# Patient Record
Sex: Male | Born: 1945
Health system: Southern US, Community
[De-identification: ages and names within clinical notes are randomized; demographics above are authoritative.]

## PROBLEM LIST (undated history)

## (undated) DIAGNOSIS — T8859XA Other complications of anesthesia, initial encounter: Secondary | ICD-10-CM

## (undated) DIAGNOSIS — C439 Malignant melanoma of skin, unspecified: Secondary | ICD-10-CM

## (undated) DIAGNOSIS — R112 Nausea with vomiting, unspecified: Secondary | ICD-10-CM

## (undated) DIAGNOSIS — C73 Malignant neoplasm of thyroid gland: Secondary | ICD-10-CM

## (undated) DIAGNOSIS — I10 Essential (primary) hypertension: Secondary | ICD-10-CM

## (undated) DIAGNOSIS — K635 Polyp of colon: Secondary | ICD-10-CM

## (undated) DIAGNOSIS — R32 Unspecified urinary incontinence: Secondary | ICD-10-CM

## (undated) DIAGNOSIS — M1990 Unspecified osteoarthritis, unspecified site: Secondary | ICD-10-CM

## (undated) DIAGNOSIS — T7840XA Allergy, unspecified, initial encounter: Secondary | ICD-10-CM

## (undated) DIAGNOSIS — Z8619 Personal history of other infectious and parasitic diseases: Secondary | ICD-10-CM

## (undated) DIAGNOSIS — J383 Other diseases of vocal cords: Secondary | ICD-10-CM

## (undated) DIAGNOSIS — Z9889 Other specified postprocedural states: Secondary | ICD-10-CM

## (undated) DIAGNOSIS — E785 Hyperlipidemia, unspecified: Secondary | ICD-10-CM

## (undated) DIAGNOSIS — E039 Hypothyroidism, unspecified: Secondary | ICD-10-CM

## (undated) DIAGNOSIS — N189 Chronic kidney disease, unspecified: Secondary | ICD-10-CM

## (undated) DIAGNOSIS — R011 Cardiac murmur, unspecified: Secondary | ICD-10-CM

## (undated) HISTORY — PX: CARDIAC VALVE REPLACEMENT: SHX585

## (undated) HISTORY — DX: Unspecified urinary incontinence: R32

## (undated) HISTORY — PX: MELANOMA EXCISION: SHX5266

## (undated) HISTORY — DX: Essential (primary) hypertension: I10

## (undated) HISTORY — DX: Polyp of colon: K63.5

## (undated) HISTORY — DX: Malignant neoplasm of thyroid gland: C73

## (undated) HISTORY — DX: Cardiac murmur, unspecified: R01.1

## (undated) HISTORY — DX: Other diseases of vocal cords: J38.3

## (undated) HISTORY — DX: Hyperlipidemia, unspecified: E78.5

## (undated) HISTORY — DX: Chronic kidney disease, unspecified: N18.9

## (undated) HISTORY — DX: Personal history of other infectious and parasitic diseases: Z86.19

## (undated) HISTORY — PX: JOINT REPLACEMENT: SHX530

## (undated) HISTORY — DX: Allergy, unspecified, initial encounter: T78.40XA

## (undated) HISTORY — DX: Malignant melanoma of skin, unspecified: C43.9

## (undated) HISTORY — PX: COLONOSCOPY: SHX174

---
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
## 1988-05-09 HISTORY — PX: MELANOMA EXCISION: SHX5266

## 1992-05-09 HISTORY — PX: MELANOMA EXCISION: SHX5266

## 2000-05-09 HISTORY — PX: TOTAL THYROIDECTOMY: SHX2547

## 2013-07-10 LAB — HM COLONOSCOPY

## 2014-05-09 HISTORY — PX: TOTAL KNEE ARTHROPLASTY: SHX125

## 2014-05-09 HISTORY — PX: BLADDER SURGERY: SHX569

## 2015-05-14 DIAGNOSIS — N401 Enlarged prostate with lower urinary tract symptoms: Secondary | ICD-10-CM | POA: Diagnosis not present

## 2015-05-29 DIAGNOSIS — R338 Other retention of urine: Secondary | ICD-10-CM | POA: Diagnosis not present

## 2015-06-03 DIAGNOSIS — N400 Enlarged prostate without lower urinary tract symptoms: Secondary | ICD-10-CM | POA: Diagnosis not present

## 2015-06-03 DIAGNOSIS — N401 Enlarged prostate with lower urinary tract symptoms: Secondary | ICD-10-CM | POA: Diagnosis not present

## 2015-06-03 DIAGNOSIS — R339 Retention of urine, unspecified: Secondary | ICD-10-CM | POA: Diagnosis not present

## 2015-06-03 DIAGNOSIS — Z9089 Acquired absence of other organs: Secondary | ICD-10-CM | POA: Diagnosis not present

## 2015-06-03 DIAGNOSIS — N320 Bladder-neck obstruction: Secondary | ICD-10-CM | POA: Diagnosis not present

## 2015-06-03 DIAGNOSIS — Z882 Allergy status to sulfonamides status: Secondary | ICD-10-CM | POA: Diagnosis not present

## 2015-06-03 DIAGNOSIS — Z85850 Personal history of malignant neoplasm of thyroid: Secondary | ICD-10-CM | POA: Diagnosis not present

## 2015-06-03 DIAGNOSIS — Z885 Allergy status to narcotic agent status: Secondary | ICD-10-CM | POA: Diagnosis not present

## 2015-06-08 DIAGNOSIS — R338 Other retention of urine: Secondary | ICD-10-CM | POA: Diagnosis not present

## 2015-06-18 DIAGNOSIS — H18232 Secondary corneal edema, left eye: Secondary | ICD-10-CM | POA: Diagnosis not present

## 2015-06-18 DIAGNOSIS — H2513 Age-related nuclear cataract, bilateral: Secondary | ICD-10-CM | POA: Diagnosis not present

## 2015-06-23 DIAGNOSIS — R112 Nausea with vomiting, unspecified: Secondary | ICD-10-CM | POA: Diagnosis not present

## 2015-07-14 DIAGNOSIS — E785 Hyperlipidemia, unspecified: Secondary | ICD-10-CM | POA: Diagnosis not present

## 2015-07-14 DIAGNOSIS — C73 Malignant neoplasm of thyroid gland: Secondary | ICD-10-CM | POA: Diagnosis not present

## 2015-07-14 DIAGNOSIS — E291 Testicular hypofunction: Secondary | ICD-10-CM | POA: Diagnosis not present

## 2015-07-14 DIAGNOSIS — E039 Hypothyroidism, unspecified: Secondary | ICD-10-CM | POA: Diagnosis not present

## 2015-07-14 DIAGNOSIS — I10 Essential (primary) hypertension: Secondary | ICD-10-CM | POA: Diagnosis not present

## 2015-07-14 DIAGNOSIS — Z125 Encounter for screening for malignant neoplasm of prostate: Secondary | ICD-10-CM | POA: Diagnosis not present

## 2015-07-16 DIAGNOSIS — E890 Postprocedural hypothyroidism: Secondary | ICD-10-CM | POA: Diagnosis not present

## 2015-08-12 DIAGNOSIS — E890 Postprocedural hypothyroidism: Secondary | ICD-10-CM | POA: Insufficient documentation

## 2015-08-12 DIAGNOSIS — E291 Testicular hypofunction: Secondary | ICD-10-CM | POA: Insufficient documentation

## 2015-08-23 DIAGNOSIS — Z85850 Personal history of malignant neoplasm of thyroid: Secondary | ICD-10-CM | POA: Diagnosis not present

## 2015-08-23 DIAGNOSIS — I10 Essential (primary) hypertension: Secondary | ICD-10-CM | POA: Diagnosis not present

## 2015-08-23 DIAGNOSIS — E039 Hypothyroidism, unspecified: Secondary | ICD-10-CM | POA: Diagnosis not present

## 2015-08-23 DIAGNOSIS — Z886 Allergy status to analgesic agent status: Secondary | ICD-10-CM | POA: Diagnosis not present

## 2015-08-23 DIAGNOSIS — G510 Bell's palsy: Secondary | ICD-10-CM | POA: Diagnosis not present

## 2015-08-23 DIAGNOSIS — E785 Hyperlipidemia, unspecified: Secondary | ICD-10-CM | POA: Diagnosis not present

## 2015-08-23 DIAGNOSIS — Z79899 Other long term (current) drug therapy: Secondary | ICD-10-CM | POA: Diagnosis not present

## 2015-08-23 DIAGNOSIS — Z882 Allergy status to sulfonamides status: Secondary | ICD-10-CM | POA: Diagnosis not present

## 2015-08-23 DIAGNOSIS — Z7982 Long term (current) use of aspirin: Secondary | ICD-10-CM | POA: Diagnosis not present

## 2015-08-24 DIAGNOSIS — G510 Bell's palsy: Secondary | ICD-10-CM | POA: Diagnosis not present

## 2015-09-04 DIAGNOSIS — R338 Other retention of urine: Secondary | ICD-10-CM | POA: Diagnosis not present

## 2015-09-04 DIAGNOSIS — G510 Bell's palsy: Secondary | ICD-10-CM | POA: Diagnosis not present

## 2015-09-04 DIAGNOSIS — H2513 Age-related nuclear cataract, bilateral: Secondary | ICD-10-CM | POA: Diagnosis not present

## 2015-09-04 DIAGNOSIS — H18232 Secondary corneal edema, left eye: Secondary | ICD-10-CM | POA: Diagnosis not present

## 2015-10-08 DIAGNOSIS — H18232 Secondary corneal edema, left eye: Secondary | ICD-10-CM | POA: Diagnosis not present

## 2015-10-08 DIAGNOSIS — H2513 Age-related nuclear cataract, bilateral: Secondary | ICD-10-CM | POA: Diagnosis not present

## 2015-10-08 DIAGNOSIS — G510 Bell's palsy: Secondary | ICD-10-CM | POA: Diagnosis not present

## 2015-10-14 DIAGNOSIS — E039 Hypothyroidism, unspecified: Secondary | ICD-10-CM | POA: Diagnosis not present

## 2015-10-14 DIAGNOSIS — E291 Testicular hypofunction: Secondary | ICD-10-CM | POA: Diagnosis not present

## 2015-12-08 DIAGNOSIS — Z0000 Encounter for general adult medical examination without abnormal findings: Secondary | ICD-10-CM | POA: Diagnosis not present

## 2015-12-08 DIAGNOSIS — E291 Testicular hypofunction: Secondary | ICD-10-CM | POA: Diagnosis not present

## 2015-12-08 DIAGNOSIS — E7800 Pure hypercholesterolemia, unspecified: Secondary | ICD-10-CM | POA: Diagnosis not present

## 2015-12-15 DIAGNOSIS — Z0000 Encounter for general adult medical examination without abnormal findings: Secondary | ICD-10-CM | POA: Diagnosis not present

## 2015-12-15 DIAGNOSIS — Z9889 Other specified postprocedural states: Secondary | ICD-10-CM | POA: Insufficient documentation

## 2015-12-15 DIAGNOSIS — Z23 Encounter for immunization: Secondary | ICD-10-CM | POA: Diagnosis not present

## 2015-12-15 DIAGNOSIS — Z8774 Personal history of (corrected) congenital malformations of heart and circulatory system: Secondary | ICD-10-CM | POA: Insufficient documentation

## 2016-01-04 DIAGNOSIS — Z23 Encounter for immunization: Secondary | ICD-10-CM | POA: Diagnosis not present

## 2016-02-18 DIAGNOSIS — H2513 Age-related nuclear cataract, bilateral: Secondary | ICD-10-CM | POA: Diagnosis not present

## 2016-02-18 DIAGNOSIS — G510 Bell's palsy: Secondary | ICD-10-CM | POA: Diagnosis not present

## 2016-02-18 DIAGNOSIS — H18232 Secondary corneal edema, left eye: Secondary | ICD-10-CM | POA: Diagnosis not present

## 2016-03-14 DIAGNOSIS — Z125 Encounter for screening for malignant neoplasm of prostate: Secondary | ICD-10-CM | POA: Diagnosis not present

## 2016-03-14 DIAGNOSIS — I10 Essential (primary) hypertension: Secondary | ICD-10-CM | POA: Diagnosis not present

## 2016-03-14 DIAGNOSIS — C73 Malignant neoplasm of thyroid gland: Secondary | ICD-10-CM | POA: Diagnosis not present

## 2016-03-14 DIAGNOSIS — E039 Hypothyroidism, unspecified: Secondary | ICD-10-CM | POA: Diagnosis not present

## 2016-03-14 DIAGNOSIS — E785 Hyperlipidemia, unspecified: Secondary | ICD-10-CM | POA: Diagnosis not present

## 2016-03-14 DIAGNOSIS — E291 Testicular hypofunction: Secondary | ICD-10-CM | POA: Diagnosis not present

## 2016-03-17 DIAGNOSIS — J00 Acute nasopharyngitis [common cold]: Secondary | ICD-10-CM | POA: Diagnosis not present

## 2016-03-18 DIAGNOSIS — E785 Hyperlipidemia, unspecified: Secondary | ICD-10-CM | POA: Diagnosis not present

## 2016-03-18 DIAGNOSIS — M25511 Pain in right shoulder: Secondary | ICD-10-CM | POA: Diagnosis not present

## 2016-03-18 DIAGNOSIS — I10 Essential (primary) hypertension: Secondary | ICD-10-CM | POA: Diagnosis not present

## 2016-03-18 DIAGNOSIS — S4990XA Unspecified injury of shoulder and upper arm, unspecified arm, initial encounter: Secondary | ICD-10-CM | POA: Diagnosis not present

## 2016-03-18 DIAGNOSIS — Z85850 Personal history of malignant neoplasm of thyroid: Secondary | ICD-10-CM | POA: Diagnosis not present

## 2016-03-21 DIAGNOSIS — M75121 Complete rotator cuff tear or rupture of right shoulder, not specified as traumatic: Secondary | ICD-10-CM | POA: Diagnosis not present

## 2016-03-21 DIAGNOSIS — M25511 Pain in right shoulder: Secondary | ICD-10-CM | POA: Diagnosis not present

## 2016-03-28 DIAGNOSIS — R05 Cough: Secondary | ICD-10-CM | POA: Diagnosis not present

## 2016-04-08 DIAGNOSIS — M25511 Pain in right shoulder: Secondary | ICD-10-CM | POA: Diagnosis not present

## 2016-04-21 DIAGNOSIS — M25511 Pain in right shoulder: Secondary | ICD-10-CM | POA: Diagnosis not present

## 2016-04-29 DIAGNOSIS — M25511 Pain in right shoulder: Secondary | ICD-10-CM | POA: Diagnosis not present

## 2016-05-04 DIAGNOSIS — M25511 Pain in right shoulder: Secondary | ICD-10-CM | POA: Diagnosis not present

## 2016-05-06 DIAGNOSIS — M5412 Radiculopathy, cervical region: Secondary | ICD-10-CM | POA: Diagnosis not present

## 2016-05-10 DIAGNOSIS — M4802 Spinal stenosis, cervical region: Secondary | ICD-10-CM | POA: Diagnosis not present

## 2016-05-10 DIAGNOSIS — M5412 Radiculopathy, cervical region: Secondary | ICD-10-CM | POA: Diagnosis not present

## 2016-05-16 DIAGNOSIS — M6281 Muscle weakness (generalized): Secondary | ICD-10-CM | POA: Diagnosis not present

## 2016-05-20 DIAGNOSIS — M792 Neuralgia and neuritis, unspecified: Secondary | ICD-10-CM | POA: Diagnosis not present

## 2016-05-20 DIAGNOSIS — M5412 Radiculopathy, cervical region: Secondary | ICD-10-CM | POA: Diagnosis not present

## 2016-05-20 DIAGNOSIS — M4802 Spinal stenosis, cervical region: Secondary | ICD-10-CM | POA: Diagnosis not present

## 2016-05-24 DIAGNOSIS — M5412 Radiculopathy, cervical region: Secondary | ICD-10-CM | POA: Diagnosis not present

## 2016-05-26 DIAGNOSIS — M6281 Muscle weakness (generalized): Secondary | ICD-10-CM | POA: Diagnosis not present

## 2016-05-26 DIAGNOSIS — M5412 Radiculopathy, cervical region: Secondary | ICD-10-CM | POA: Diagnosis not present

## 2016-06-02 DIAGNOSIS — M6281 Muscle weakness (generalized): Secondary | ICD-10-CM | POA: Diagnosis not present

## 2016-06-02 DIAGNOSIS — M25511 Pain in right shoulder: Secondary | ICD-10-CM | POA: Diagnosis not present

## 2016-06-08 DIAGNOSIS — M6281 Muscle weakness (generalized): Secondary | ICD-10-CM | POA: Diagnosis not present

## 2016-06-17 DIAGNOSIS — M542 Cervicalgia: Secondary | ICD-10-CM | POA: Diagnosis not present

## 2016-06-23 DIAGNOSIS — E039 Hypothyroidism, unspecified: Secondary | ICD-10-CM | POA: Diagnosis not present

## 2016-06-23 DIAGNOSIS — Z882 Allergy status to sulfonamides status: Secondary | ICD-10-CM | POA: Diagnosis not present

## 2016-06-23 DIAGNOSIS — Z7982 Long term (current) use of aspirin: Secondary | ICD-10-CM | POA: Diagnosis not present

## 2016-06-23 DIAGNOSIS — I10 Essential (primary) hypertension: Secondary | ICD-10-CM | POA: Diagnosis not present

## 2016-06-23 DIAGNOSIS — Q213 Tetralogy of Fallot: Secondary | ICD-10-CM | POA: Diagnosis not present

## 2016-06-23 DIAGNOSIS — G510 Bell's palsy: Secondary | ICD-10-CM | POA: Diagnosis not present

## 2016-06-23 DIAGNOSIS — Z79899 Other long term (current) drug therapy: Secondary | ICD-10-CM | POA: Diagnosis not present

## 2016-06-23 DIAGNOSIS — H179 Unspecified corneal scar and opacity: Secondary | ICD-10-CM | POA: Diagnosis not present

## 2016-06-23 DIAGNOSIS — H2513 Age-related nuclear cataract, bilateral: Secondary | ICD-10-CM | POA: Diagnosis not present

## 2016-06-23 DIAGNOSIS — Z85850 Personal history of malignant neoplasm of thyroid: Secondary | ICD-10-CM | POA: Diagnosis not present

## 2016-06-24 DIAGNOSIS — Q213 Tetralogy of Fallot: Secondary | ICD-10-CM | POA: Diagnosis not present

## 2016-06-24 DIAGNOSIS — I10 Essential (primary) hypertension: Secondary | ICD-10-CM | POA: Diagnosis not present

## 2016-06-26 DIAGNOSIS — I10 Essential (primary) hypertension: Secondary | ICD-10-CM | POA: Diagnosis not present

## 2016-06-28 DIAGNOSIS — I10 Essential (primary) hypertension: Secondary | ICD-10-CM | POA: Diagnosis not present

## 2016-06-28 DIAGNOSIS — Q213 Tetralogy of Fallot: Secondary | ICD-10-CM | POA: Diagnosis not present

## 2016-06-28 DIAGNOSIS — I491 Atrial premature depolarization: Secondary | ICD-10-CM | POA: Diagnosis not present

## 2016-06-28 DIAGNOSIS — I77819 Aortic ectasia, unspecified site: Secondary | ICD-10-CM | POA: Diagnosis not present

## 2016-06-28 DIAGNOSIS — I371 Nonrheumatic pulmonary valve insufficiency: Secondary | ICD-10-CM | POA: Diagnosis not present

## 2016-07-05 DIAGNOSIS — I491 Atrial premature depolarization: Secondary | ICD-10-CM | POA: Diagnosis not present

## 2016-07-13 DIAGNOSIS — I77819 Aortic ectasia, unspecified site: Secondary | ICD-10-CM | POA: Diagnosis not present

## 2016-07-13 DIAGNOSIS — I77810 Thoracic aortic ectasia: Secondary | ICD-10-CM | POA: Diagnosis not present

## 2016-07-13 DIAGNOSIS — I351 Nonrheumatic aortic (valve) insufficiency: Secondary | ICD-10-CM | POA: Diagnosis not present

## 2016-07-13 DIAGNOSIS — Q213 Tetralogy of Fallot: Secondary | ICD-10-CM | POA: Diagnosis not present

## 2016-07-13 DIAGNOSIS — Z9889 Other specified postprocedural states: Secondary | ICD-10-CM | POA: Diagnosis not present

## 2016-07-21 DIAGNOSIS — Q213 Tetralogy of Fallot: Secondary | ICD-10-CM | POA: Diagnosis not present

## 2016-07-29 DIAGNOSIS — Q213 Tetralogy of Fallot: Secondary | ICD-10-CM | POA: Diagnosis not present

## 2016-08-04 DIAGNOSIS — J984 Other disorders of lung: Secondary | ICD-10-CM | POA: Diagnosis not present

## 2016-08-04 DIAGNOSIS — B1910 Unspecified viral hepatitis B without hepatic coma: Secondary | ICD-10-CM | POA: Diagnosis not present

## 2016-08-04 DIAGNOSIS — I712 Thoracic aortic aneurysm, without rupture: Secondary | ICD-10-CM | POA: Diagnosis not present

## 2016-08-04 DIAGNOSIS — E7800 Pure hypercholesterolemia, unspecified: Secondary | ICD-10-CM | POA: Diagnosis not present

## 2016-08-04 DIAGNOSIS — Z85820 Personal history of malignant melanoma of skin: Secondary | ICD-10-CM | POA: Diagnosis not present

## 2016-08-04 DIAGNOSIS — Z85850 Personal history of malignant neoplasm of thyroid: Secondary | ICD-10-CM | POA: Diagnosis not present

## 2016-08-04 DIAGNOSIS — I10 Essential (primary) hypertension: Secondary | ICD-10-CM | POA: Diagnosis not present

## 2016-08-04 DIAGNOSIS — I371 Nonrheumatic pulmonary valve insufficiency: Secondary | ICD-10-CM | POA: Diagnosis not present

## 2016-08-04 DIAGNOSIS — I4510 Unspecified right bundle-branch block: Secondary | ICD-10-CM | POA: Diagnosis not present

## 2016-08-04 DIAGNOSIS — E890 Postprocedural hypothyroidism: Secondary | ICD-10-CM | POA: Diagnosis not present

## 2016-08-04 DIAGNOSIS — Z7982 Long term (current) use of aspirin: Secondary | ICD-10-CM | POA: Diagnosis not present

## 2016-08-04 DIAGNOSIS — Q213 Tetralogy of Fallot: Secondary | ICD-10-CM | POA: Diagnosis not present

## 2016-08-14 DIAGNOSIS — I499 Cardiac arrhythmia, unspecified: Secondary | ICD-10-CM | POA: Diagnosis not present

## 2016-08-23 DIAGNOSIS — I2510 Atherosclerotic heart disease of native coronary artery without angina pectoris: Secondary | ICD-10-CM | POA: Diagnosis not present

## 2016-08-23 DIAGNOSIS — I371 Nonrheumatic pulmonary valve insufficiency: Secondary | ICD-10-CM | POA: Diagnosis not present

## 2016-08-23 DIAGNOSIS — Q213 Tetralogy of Fallot: Secondary | ICD-10-CM | POA: Diagnosis not present

## 2016-09-15 DIAGNOSIS — Q213 Tetralogy of Fallot: Secondary | ICD-10-CM | POA: Diagnosis not present

## 2016-10-17 DIAGNOSIS — Z85850 Personal history of malignant neoplasm of thyroid: Secondary | ICD-10-CM | POA: Diagnosis not present

## 2016-10-17 DIAGNOSIS — E785 Hyperlipidemia, unspecified: Secondary | ICD-10-CM | POA: Diagnosis not present

## 2016-10-17 DIAGNOSIS — E039 Hypothyroidism, unspecified: Secondary | ICD-10-CM | POA: Diagnosis not present

## 2016-10-17 DIAGNOSIS — I493 Ventricular premature depolarization: Secondary | ICD-10-CM | POA: Diagnosis not present

## 2016-10-17 DIAGNOSIS — R9431 Abnormal electrocardiogram [ECG] [EKG]: Secondary | ICD-10-CM | POA: Diagnosis not present

## 2016-10-17 DIAGNOSIS — I10 Essential (primary) hypertension: Secondary | ICD-10-CM | POA: Diagnosis not present

## 2016-10-17 DIAGNOSIS — Q213 Tetralogy of Fallot: Secondary | ICD-10-CM | POA: Diagnosis not present

## 2016-10-17 DIAGNOSIS — Z85820 Personal history of malignant melanoma of skin: Secondary | ICD-10-CM | POA: Diagnosis not present

## 2016-10-17 DIAGNOSIS — Z7982 Long term (current) use of aspirin: Secondary | ICD-10-CM | POA: Diagnosis not present

## 2016-10-17 DIAGNOSIS — Z01818 Encounter for other preprocedural examination: Secondary | ICD-10-CM | POA: Diagnosis not present

## 2016-10-17 DIAGNOSIS — I4510 Unspecified right bundle-branch block: Secondary | ICD-10-CM | POA: Diagnosis not present

## 2016-10-21 DIAGNOSIS — Q222 Congenital pulmonary valve insufficiency: Secondary | ICD-10-CM | POA: Diagnosis not present

## 2016-10-21 DIAGNOSIS — J9859 Other diseases of mediastinum, not elsewhere classified: Secondary | ICD-10-CM | POA: Diagnosis not present

## 2016-10-21 DIAGNOSIS — I2510 Atherosclerotic heart disease of native coronary artery without angina pectoris: Secondary | ICD-10-CM | POA: Diagnosis not present

## 2016-10-21 DIAGNOSIS — Z8774 Personal history of (corrected) congenital malformations of heart and circulatory system: Secondary | ICD-10-CM | POA: Diagnosis not present

## 2016-10-21 DIAGNOSIS — E039 Hypothyroidism, unspecified: Secondary | ICD-10-CM | POA: Diagnosis not present

## 2016-10-21 DIAGNOSIS — Z85850 Personal history of malignant neoplasm of thyroid: Secondary | ICD-10-CM | POA: Diagnosis not present

## 2016-10-21 DIAGNOSIS — S3739XA Other injury of urethra, initial encounter: Secondary | ICD-10-CM | POA: Diagnosis not present

## 2016-10-21 DIAGNOSIS — I371 Nonrheumatic pulmonary valve insufficiency: Secondary | ICD-10-CM | POA: Diagnosis not present

## 2016-10-21 DIAGNOSIS — I82612 Acute embolism and thrombosis of superficial veins of left upper extremity: Secondary | ICD-10-CM | POA: Diagnosis not present

## 2016-10-21 DIAGNOSIS — E875 Hyperkalemia: Secondary | ICD-10-CM | POA: Diagnosis not present

## 2016-10-21 DIAGNOSIS — K5900 Constipation, unspecified: Secondary | ICD-10-CM | POA: Diagnosis not present

## 2016-10-21 DIAGNOSIS — D688 Other specified coagulation defects: Secondary | ICD-10-CM | POA: Diagnosis not present

## 2016-10-21 DIAGNOSIS — I77810 Thoracic aortic ectasia: Secondary | ICD-10-CM | POA: Diagnosis not present

## 2016-10-21 DIAGNOSIS — J951 Acute pulmonary insufficiency following thoracic surgery: Secondary | ICD-10-CM | POA: Diagnosis not present

## 2016-10-21 DIAGNOSIS — N400 Enlarged prostate without lower urinary tract symptoms: Secondary | ICD-10-CM | POA: Diagnosis not present

## 2016-10-21 DIAGNOSIS — Z85820 Personal history of malignant melanoma of skin: Secondary | ICD-10-CM | POA: Diagnosis not present

## 2016-10-21 DIAGNOSIS — Z7982 Long term (current) use of aspirin: Secondary | ICD-10-CM | POA: Diagnosis not present

## 2016-10-21 DIAGNOSIS — I361 Nonrheumatic tricuspid (valve) insufficiency: Secondary | ICD-10-CM | POA: Diagnosis not present

## 2016-10-21 DIAGNOSIS — Z952 Presence of prosthetic heart valve: Secondary | ICD-10-CM | POA: Diagnosis not present

## 2016-10-21 DIAGNOSIS — Z79899 Other long term (current) drug therapy: Secondary | ICD-10-CM | POA: Diagnosis not present

## 2016-10-21 DIAGNOSIS — Q213 Tetralogy of Fallot: Secondary | ICD-10-CM | POA: Diagnosis not present

## 2016-10-21 DIAGNOSIS — I499 Cardiac arrhythmia, unspecified: Secondary | ICD-10-CM | POA: Diagnosis not present

## 2016-10-21 DIAGNOSIS — Z79891 Long term (current) use of opiate analgesic: Secondary | ICD-10-CM | POA: Diagnosis not present

## 2016-10-21 DIAGNOSIS — Z86010 Personal history of colonic polyps: Secondary | ICD-10-CM | POA: Diagnosis not present

## 2016-10-21 DIAGNOSIS — R579 Shock, unspecified: Secondary | ICD-10-CM | POA: Diagnosis not present

## 2016-10-21 DIAGNOSIS — Z7952 Long term (current) use of systemic steroids: Secondary | ICD-10-CM | POA: Diagnosis not present

## 2016-10-21 DIAGNOSIS — E872 Acidosis: Secondary | ICD-10-CM | POA: Diagnosis not present

## 2016-10-21 DIAGNOSIS — R7309 Other abnormal glucose: Secondary | ICD-10-CM | POA: Diagnosis not present

## 2016-10-21 DIAGNOSIS — E785 Hyperlipidemia, unspecified: Secondary | ICD-10-CM | POA: Diagnosis not present

## 2016-10-21 DIAGNOSIS — J811 Chronic pulmonary edema: Secondary | ICD-10-CM | POA: Diagnosis not present

## 2016-10-21 DIAGNOSIS — I517 Cardiomegaly: Secondary | ICD-10-CM | POA: Diagnosis not present

## 2016-10-21 DIAGNOSIS — I10 Essential (primary) hypertension: Secondary | ICD-10-CM | POA: Diagnosis not present

## 2016-10-21 DIAGNOSIS — Z4682 Encounter for fitting and adjustment of non-vascular catheter: Secondary | ICD-10-CM | POA: Diagnosis not present

## 2016-10-21 HISTORY — PX: PULMONARY VALVE REPLACEMENT: SHX173

## 2016-11-03 DIAGNOSIS — Z952 Presence of prosthetic heart valve: Secondary | ICD-10-CM | POA: Diagnosis not present

## 2016-11-03 DIAGNOSIS — Q213 Tetralogy of Fallot: Secondary | ICD-10-CM | POA: Diagnosis not present

## 2016-11-18 DIAGNOSIS — Q222 Congenital pulmonary valve insufficiency: Secondary | ICD-10-CM | POA: Diagnosis not present

## 2016-11-18 DIAGNOSIS — E785 Hyperlipidemia, unspecified: Secondary | ICD-10-CM | POA: Diagnosis not present

## 2016-11-18 DIAGNOSIS — Z8774 Personal history of (corrected) congenital malformations of heart and circulatory system: Secondary | ICD-10-CM | POA: Diagnosis not present

## 2016-11-18 DIAGNOSIS — Z952 Presence of prosthetic heart valve: Secondary | ICD-10-CM | POA: Diagnosis not present

## 2016-11-18 DIAGNOSIS — I10 Essential (primary) hypertension: Secondary | ICD-10-CM | POA: Diagnosis not present

## 2016-11-21 DIAGNOSIS — Z8774 Personal history of (corrected) congenital malformations of heart and circulatory system: Secondary | ICD-10-CM | POA: Diagnosis not present

## 2016-11-21 DIAGNOSIS — Z952 Presence of prosthetic heart valve: Secondary | ICD-10-CM | POA: Diagnosis not present

## 2016-11-21 DIAGNOSIS — E785 Hyperlipidemia, unspecified: Secondary | ICD-10-CM | POA: Diagnosis not present

## 2016-11-21 DIAGNOSIS — I10 Essential (primary) hypertension: Secondary | ICD-10-CM | POA: Diagnosis not present

## 2016-11-21 DIAGNOSIS — Q222 Congenital pulmonary valve insufficiency: Secondary | ICD-10-CM | POA: Diagnosis not present

## 2016-11-22 DIAGNOSIS — R05 Cough: Secondary | ICD-10-CM | POA: Diagnosis not present

## 2016-11-25 DIAGNOSIS — Z952 Presence of prosthetic heart valve: Secondary | ICD-10-CM | POA: Diagnosis not present

## 2016-11-25 DIAGNOSIS — E785 Hyperlipidemia, unspecified: Secondary | ICD-10-CM | POA: Diagnosis not present

## 2016-11-25 DIAGNOSIS — I10 Essential (primary) hypertension: Secondary | ICD-10-CM | POA: Diagnosis not present

## 2016-11-25 DIAGNOSIS — Z8774 Personal history of (corrected) congenital malformations of heart and circulatory system: Secondary | ICD-10-CM | POA: Diagnosis not present

## 2016-11-25 DIAGNOSIS — Q222 Congenital pulmonary valve insufficiency: Secondary | ICD-10-CM | POA: Diagnosis not present

## 2016-11-28 DIAGNOSIS — Q222 Congenital pulmonary valve insufficiency: Secondary | ICD-10-CM | POA: Diagnosis not present

## 2016-11-28 DIAGNOSIS — Z8774 Personal history of (corrected) congenital malformations of heart and circulatory system: Secondary | ICD-10-CM | POA: Diagnosis not present

## 2016-11-28 DIAGNOSIS — E785 Hyperlipidemia, unspecified: Secondary | ICD-10-CM | POA: Diagnosis not present

## 2016-11-28 DIAGNOSIS — Z952 Presence of prosthetic heart valve: Secondary | ICD-10-CM | POA: Diagnosis not present

## 2016-11-28 DIAGNOSIS — I10 Essential (primary) hypertension: Secondary | ICD-10-CM | POA: Diagnosis not present

## 2016-11-30 DIAGNOSIS — I10 Essential (primary) hypertension: Secondary | ICD-10-CM | POA: Diagnosis not present

## 2016-11-30 DIAGNOSIS — Z8774 Personal history of (corrected) congenital malformations of heart and circulatory system: Secondary | ICD-10-CM | POA: Diagnosis not present

## 2016-11-30 DIAGNOSIS — Q222 Congenital pulmonary valve insufficiency: Secondary | ICD-10-CM | POA: Diagnosis not present

## 2016-11-30 DIAGNOSIS — Z952 Presence of prosthetic heart valve: Secondary | ICD-10-CM | POA: Diagnosis not present

## 2016-11-30 DIAGNOSIS — E785 Hyperlipidemia, unspecified: Secondary | ICD-10-CM | POA: Diagnosis not present

## 2016-12-02 DIAGNOSIS — Z952 Presence of prosthetic heart valve: Secondary | ICD-10-CM | POA: Diagnosis not present

## 2016-12-02 DIAGNOSIS — I10 Essential (primary) hypertension: Secondary | ICD-10-CM | POA: Diagnosis not present

## 2016-12-02 DIAGNOSIS — Q222 Congenital pulmonary valve insufficiency: Secondary | ICD-10-CM | POA: Diagnosis not present

## 2016-12-02 DIAGNOSIS — Z8774 Personal history of (corrected) congenital malformations of heart and circulatory system: Secondary | ICD-10-CM | POA: Diagnosis not present

## 2016-12-02 DIAGNOSIS — E785 Hyperlipidemia, unspecified: Secondary | ICD-10-CM | POA: Diagnosis not present

## 2016-12-07 DIAGNOSIS — E785 Hyperlipidemia, unspecified: Secondary | ICD-10-CM | POA: Diagnosis not present

## 2016-12-07 DIAGNOSIS — Z8774 Personal history of (corrected) congenital malformations of heart and circulatory system: Secondary | ICD-10-CM | POA: Diagnosis not present

## 2016-12-07 DIAGNOSIS — Z952 Presence of prosthetic heart valve: Secondary | ICD-10-CM | POA: Diagnosis not present

## 2016-12-07 DIAGNOSIS — Q222 Congenital pulmonary valve insufficiency: Secondary | ICD-10-CM | POA: Diagnosis not present

## 2016-12-07 DIAGNOSIS — I10 Essential (primary) hypertension: Secondary | ICD-10-CM | POA: Diagnosis not present

## 2016-12-09 DIAGNOSIS — E785 Hyperlipidemia, unspecified: Secondary | ICD-10-CM | POA: Diagnosis not present

## 2016-12-09 DIAGNOSIS — I10 Essential (primary) hypertension: Secondary | ICD-10-CM | POA: Diagnosis not present

## 2016-12-09 DIAGNOSIS — Q222 Congenital pulmonary valve insufficiency: Secondary | ICD-10-CM | POA: Diagnosis not present

## 2016-12-09 DIAGNOSIS — Z8774 Personal history of (corrected) congenital malformations of heart and circulatory system: Secondary | ICD-10-CM | POA: Diagnosis not present

## 2016-12-09 DIAGNOSIS — Z952 Presence of prosthetic heart valve: Secondary | ICD-10-CM | POA: Diagnosis not present

## 2016-12-12 DIAGNOSIS — Z952 Presence of prosthetic heart valve: Secondary | ICD-10-CM | POA: Diagnosis not present

## 2016-12-12 DIAGNOSIS — I10 Essential (primary) hypertension: Secondary | ICD-10-CM | POA: Diagnosis not present

## 2016-12-12 DIAGNOSIS — Z8774 Personal history of (corrected) congenital malformations of heart and circulatory system: Secondary | ICD-10-CM | POA: Diagnosis not present

## 2016-12-12 DIAGNOSIS — Q222 Congenital pulmonary valve insufficiency: Secondary | ICD-10-CM | POA: Diagnosis not present

## 2016-12-12 DIAGNOSIS — E785 Hyperlipidemia, unspecified: Secondary | ICD-10-CM | POA: Diagnosis not present

## 2017-01-05 DIAGNOSIS — E291 Testicular hypofunction: Secondary | ICD-10-CM | POA: Diagnosis not present

## 2017-01-05 DIAGNOSIS — R972 Elevated prostate specific antigen [PSA]: Secondary | ICD-10-CM | POA: Diagnosis not present

## 2017-01-05 DIAGNOSIS — N401 Enlarged prostate with lower urinary tract symptoms: Secondary | ICD-10-CM | POA: Diagnosis not present

## 2017-01-17 LAB — HM HIV SCREENING LAB: HM HIV SCREENING: NEGATIVE

## 2017-01-17 LAB — HM HEPATITIS C SCREENING LAB: HM Hepatitis Screen: NEGATIVE

## 2017-01-24 DIAGNOSIS — R05 Cough: Secondary | ICD-10-CM | POA: Diagnosis not present

## 2017-01-24 DIAGNOSIS — Z0000 Encounter for general adult medical examination without abnormal findings: Secondary | ICD-10-CM | POA: Diagnosis not present

## 2017-02-02 DIAGNOSIS — Q213 Tetralogy of Fallot: Secondary | ICD-10-CM | POA: Diagnosis not present

## 2017-02-02 DIAGNOSIS — Z952 Presence of prosthetic heart valve: Secondary | ICD-10-CM | POA: Diagnosis not present

## 2017-02-06 DIAGNOSIS — J383 Other diseases of vocal cords: Secondary | ICD-10-CM

## 2017-02-06 DIAGNOSIS — J0100 Acute maxillary sinusitis, unspecified: Secondary | ICD-10-CM | POA: Diagnosis not present

## 2017-02-06 DIAGNOSIS — R490 Dysphonia: Secondary | ICD-10-CM | POA: Diagnosis not present

## 2017-02-06 DIAGNOSIS — J342 Deviated nasal septum: Secondary | ICD-10-CM | POA: Diagnosis not present

## 2017-02-06 DIAGNOSIS — R05 Cough: Secondary | ICD-10-CM | POA: Diagnosis not present

## 2017-02-06 DIAGNOSIS — J0120 Acute ethmoidal sinusitis, unspecified: Secondary | ICD-10-CM | POA: Diagnosis not present

## 2017-02-06 HISTORY — DX: Other diseases of vocal cords: J38.3

## 2017-03-13 DIAGNOSIS — R490 Dysphonia: Secondary | ICD-10-CM | POA: Diagnosis not present

## 2017-03-13 DIAGNOSIS — J383 Other diseases of vocal cords: Secondary | ICD-10-CM | POA: Diagnosis not present

## 2017-03-13 DIAGNOSIS — R05 Cough: Secondary | ICD-10-CM | POA: Diagnosis not present

## 2017-03-27 DIAGNOSIS — J383 Other diseases of vocal cords: Secondary | ICD-10-CM | POA: Diagnosis not present

## 2017-03-27 DIAGNOSIS — R490 Dysphonia: Secondary | ICD-10-CM | POA: Diagnosis not present

## 2017-03-27 DIAGNOSIS — R05 Cough: Secondary | ICD-10-CM | POA: Diagnosis not present

## 2017-03-29 DIAGNOSIS — J383 Other diseases of vocal cords: Secondary | ICD-10-CM | POA: Insufficient documentation

## 2017-04-08 DIAGNOSIS — R002 Palpitations: Secondary | ICD-10-CM | POA: Diagnosis not present

## 2017-04-20 DIAGNOSIS — Z79899 Other long term (current) drug therapy: Secondary | ICD-10-CM | POA: Diagnosis not present

## 2017-04-20 DIAGNOSIS — Q213 Tetralogy of Fallot: Secondary | ICD-10-CM | POA: Diagnosis not present

## 2017-04-20 DIAGNOSIS — Z952 Presence of prosthetic heart valve: Secondary | ICD-10-CM | POA: Diagnosis not present

## 2017-04-24 DIAGNOSIS — R002 Palpitations: Secondary | ICD-10-CM | POA: Diagnosis not present

## 2017-05-10 DIAGNOSIS — J019 Acute sinusitis, unspecified: Secondary | ICD-10-CM | POA: Diagnosis not present

## 2017-05-15 DIAGNOSIS — R7989 Other specified abnormal findings of blood chemistry: Secondary | ICD-10-CM | POA: Diagnosis not present

## 2017-05-15 DIAGNOSIS — Z85850 Personal history of malignant neoplasm of thyroid: Secondary | ICD-10-CM | POA: Diagnosis not present

## 2017-05-15 DIAGNOSIS — E785 Hyperlipidemia, unspecified: Secondary | ICD-10-CM | POA: Diagnosis not present

## 2017-05-15 DIAGNOSIS — E039 Hypothyroidism, unspecified: Secondary | ICD-10-CM | POA: Diagnosis not present

## 2017-06-06 DIAGNOSIS — M65321 Trigger finger, right index finger: Secondary | ICD-10-CM | POA: Diagnosis not present

## 2017-06-08 DIAGNOSIS — E7800 Pure hypercholesterolemia, unspecified: Secondary | ICD-10-CM | POA: Diagnosis not present

## 2017-07-20 DIAGNOSIS — Q213 Tetralogy of Fallot: Secondary | ICD-10-CM | POA: Diagnosis not present

## 2017-07-20 DIAGNOSIS — R9431 Abnormal electrocardiogram [ECG] [EKG]: Secondary | ICD-10-CM | POA: Diagnosis not present

## 2017-07-20 DIAGNOSIS — I4510 Unspecified right bundle-branch block: Secondary | ICD-10-CM | POA: Diagnosis not present

## 2017-07-20 DIAGNOSIS — I493 Ventricular premature depolarization: Secondary | ICD-10-CM | POA: Diagnosis not present

## 2017-09-05 ENCOUNTER — Telehealth: Payer: Self-pay | Admitting: Internal Medicine

## 2017-09-05 NOTE — Telephone Encounter (Signed)
We received Christopher Mosley's previous GI records from [facility]. Spoke with Christopher Mosley and he stated that he is not due for a colon until next year but he wanted to transfer his records since he just moved to the area. He will call back next year to schedule proc. Records are in the black file cabinet.

## 2017-09-08 ENCOUNTER — Encounter: Payer: Self-pay | Admitting: Family Medicine

## 2017-09-08 ENCOUNTER — Ambulatory Visit (INDEPENDENT_AMBULATORY_CARE_PROVIDER_SITE_OTHER): Payer: Medicare Other | Admitting: Family Medicine

## 2017-09-08 ENCOUNTER — Other Ambulatory Visit: Payer: Self-pay

## 2017-09-08 VITALS — BP 114/80 | HR 66 | Temp 98.6°F | Ht 66.0 in | Wt 158.0 lb

## 2017-09-08 DIAGNOSIS — E890 Postprocedural hypothyroidism: Secondary | ICD-10-CM

## 2017-09-08 DIAGNOSIS — Z85850 Personal history of malignant neoplasm of thyroid: Secondary | ICD-10-CM | POA: Insufficient documentation

## 2017-09-08 DIAGNOSIS — I10 Essential (primary) hypertension: Secondary | ICD-10-CM | POA: Diagnosis not present

## 2017-09-08 DIAGNOSIS — K635 Polyp of colon: Secondary | ICD-10-CM | POA: Diagnosis not present

## 2017-09-08 DIAGNOSIS — E291 Testicular hypofunction: Secondary | ICD-10-CM

## 2017-09-08 DIAGNOSIS — R600 Localized edema: Secondary | ICD-10-CM | POA: Diagnosis not present

## 2017-09-08 DIAGNOSIS — E782 Mixed hyperlipidemia: Secondary | ICD-10-CM

## 2017-09-08 HISTORY — DX: Polyp of colon: K63.5

## 2017-09-08 LAB — BASIC METABOLIC PANEL
BUN: 19 mg/dL (ref 6–23)
CALCIUM: 9.1 mg/dL (ref 8.4–10.5)
CHLORIDE: 103 meq/L (ref 96–112)
CO2: 29 meq/L (ref 19–32)
Creatinine, Ser: 1.22 mg/dL (ref 0.40–1.50)
GFR: 62.44 mL/min (ref >60.00)
GLUCOSE: 118 mg/dL — AB (ref 70–99)
POTASSIUM: 4.5 meq/L (ref 3.5–5.1)
SODIUM: 140 meq/L (ref 135–145)

## 2017-09-08 MED ORDER — PNEUMOCOCCAL 13-VAL CONJ VACC IM SUSP: 0.5000 mL | Freq: Once | INTRAMUSCULAR | 0 refills | Status: AC

## 2017-09-08 NOTE — Patient Instructions (Addendum)
Please return in 4 months for your annual complete physical; please come fasting.  Medicare recommends an Annual Wellness Visit for all patients. Please schedule this to be done with our Nurse Educator, Maudie Mercury. This is an informative "talk" visit; it's goals are to ensure that your health care needs are being met and to give you education regarding avoiding falls, ensuring you are not suffering from depression or problems with memory or thinking, and to educate you on Advance Care Planning. It helps me take good care of you!  Please go to the Lab for blood work.    If you have MyChart, your results will be available to view, please respond through [place] with questions.    Please take this prescription to the pharmacy and get your pneumonia vaccine.    It was a pleasure meeting you today! Thank you for choosing [place] to meet your healthcare needs! I truly look forward to working with you. If you have any questions or concerns, please send me a message via Mychart or call the office at [phone].

## 2017-09-08 NOTE — Progress Notes (Signed)
Subjective  CC:  Chief Complaint  Patient presents with  . Establish Care    Transfer from [facility] in [place], last physical with AWV September 2018    HPI: Christopher Mosley is a 72 y.o. male who presents to [facility] [facility] today to establish care with me as a new patient. Relocated back to [place] from [place]; reviewed health records from [facility] in care everywhere. Pt has requested prior records from former PCP (prior to 2018) to be sent here.   He has the following concerns or needs:  Postoperative hypothyroidism: reports hasn't been at goal over last 1-2 years in spite of PCP adjusting meds. Feels fine. Last TSH 01/2017 was < 0.01; told to skip sundays dose. Compliant with meds. Energy levels are good w/o sxs of hyperthyroidism.   H/o tetrology of fallot s/p multiple cardiac surgeries; s/p pulm valve replacement last year. Will continue with cards in [place]; has f/u in June scheduled.   Htn: new dx: on amlodipine per cards. C/o ED due to med - hard to maintain erection.   Hyperlipidemia - well controlled on statin.   Colon polyp surveillance q 5 years; not certain of type. Records requested. Will be due for colonoscopy in 2020.   HM: due for prevnar.had zostavax; due for shingrix. Lives very healthy lifestyle.   We updated and reviewed the patient's past history in detail and it is documented below.  Patient Active Problem List   Diagnosis Date Noted  . History of thyroid cancer 09/08/2017  . Mixed hyperlipidemia 09/08/2017  . Essential hypertension 09/08/2017  . Colon polyps 09/08/2017    q 5 years; need records   . Vocal cord dysfunction 03/29/2017  . History of Blalock-Taussig shunt 12/15/2015  . Tetralogy of Fallot s/p repair 12/15/2015  . Hypogonadism in male 08/12/2015  . Postoperative hypothyroidism 08/12/2015   Health Maintenance  Topic Date Due  . PNA vac Low Risk Adult (1 of 2 - PCV13) 10/18/2012  . INFLUENZA  VACCINE  12/07/2017  . COLONOSCOPY  07/11/2018  . TETANUS/TDAP  12/14/2025  . Hepatitis C Screening  Completed   Immunization History  Administered Date(s) Administered  . Pneumococcal Polysaccharide-23 05/09/2008, 05/09/2013  . Tdap 12/15/2015  . Zoster 05/09/2013   Current Meds  Medication Sig  . amLODipine (NORVASC) 5 MG tablet TAKE 1 TABLET BY MOUTH EVERY DAY  . aspirin 81 MG chewable tablet Chew by mouth.  . levothyroxine (SYNTHROID, LEVOTHROID) 88 MCG tablet Take 53mcg every day but Sunday  . pravastatin (PRAVACHOL) 10 MG tablet Take 10 mg by mouth daily.  Marland Kitchen testosterone cypionate (DEPOTESTOTERONE CYPIONATE) 100 MG/ML injection Inject into the muscle.    Allergies: Patient is allergic to codeine and sulfa antibiotics. Past Medical History Patient  has a past medical history of Colon polyp, Colon polyps (09/08/2017), Heart murmur, History of hepatitis B, Hyperlipemia, Hypertension, Lesion of vocal cord (02/06/2017), Melanoma ([place]), Papillary thyroid carcinoma ([place]), and Urine incontinence. Past Surgical History Patient  has a past surgical history that includes Blalock procedure [phone]); Tetralogy of Fallot repair [phone]); Pottts shunt (1957); Melanoma excision; Total knee arthroplasty (Left, 2016); Total thyroidectomy (2002); Bladder surgery (2016); and Pulmonary valve replacement (10/21/2016). Family History: Patient family history includes Alcohol abuse in his father, maternal grandfather, and mother; Arthritis in his brother; Asthma in his brother; Cancer in his paternal grandfather; Depression in his mother; Diabetes in his brother, father, and mother; Drug abuse in his brother; Early death in his father, maternal grandmother,  and mother; Healthy in his daughter and son; Heart attack in his father, maternal grandfather, and maternal grandmother; Heart disease in his father; Hyperlipidemia in his mother; Hypertension in his father and mother; Miscarriages / [place] in his mother;  Pancreatic cancer in his father; Renal cancer in his brother; Stomach cancer in his mother; Stroke in his brother. Social History:  Patient  reports that he has never smoked. He has never used smokeless tobacco. He reports that he drinks alcohol. He reports that he does not use drugs.  Review of Systems: Constitutional: negative for fever or malaise Ophthalmic: negative for photophobia, double vision or loss of vision Cardiovascular: negative for chest pain, dyspnea on exertion, or new LE swelling Respiratory: negative for SOB or persistent cough Gastrointestinal: negative for abdominal pain, change in bowel habits or melena Genitourinary: negative for dysuria or gross hematuria Musculoskeletal: negative for new gait disturbance or muscular weakness Integumentary: negative for new or persistent rashes Neurological: negative for TIA or stroke symptoms Psychiatric: negative for SI or delusions Allergic/Immunologic: negative for hives  Patient Care Team    Relationship Specialty Notifications Start End  Leamon Arnt, MD PCP - General Family Medicine  09/08/17   Gevena Barre, MD Referring Physician Cardiology  09/08/17     Objective  Vitals: BP 114/80   Pulse 66   Temp 98.6 F (37 C)   Ht 5\' 6"  (1.676 m)   Wt 158 lb (71.7 kg)   BMI 25.50 kg/m  General:  Well developed, well nourished, no acute distress  Psych:  Alert and oriented,normal mood and affect HEENT:  Normocephalic, atraumatic, non-icteric sclera, PERRL, oropharynx is without mass or exudate, supple neck without adenopathy, mass or thyromegaly Cardiovascular:  RRR with 3/6 systolic murmur Respiratory:  Good breath sounds bilaterally, CTAB with normal respiratory effort EXT: +2 pitting edema to mid calf bilaterally Skin:  Warm Neurologic:    Mental status is normal. Gross motor and sensory exams are normal. Normal gait, no tremor  Assessment  1. Essential hypertension   2. Mixed hyperlipidemia   3. Postoperative  hypothyroidism   4. Polyp of colon, unspecified part of colon, unspecified type   5. Lower extremity edema   6. Hypogonadism in male      Plan   BP - controlled but with edema and change in ED: will discuss with card. rec changing bp meds if appropriate. Check renal function.  Lipids are controlled.   Hypothyrodism: uncontrolled. Recheck levels today and adjust dose.   Request GI records.   Pt gives himself testosterone injections; fatigue has resolved and stable levels for years. Will check annually.   HM: prevnar RX given.   Follow up:  Return in about 4 months (around 01/09/2018) for complete physical. AWV  Commons side effects, risks, benefits, and alternatives for medications and treatment plan prescribed today were discussed, and the patient expressed understanding of the given instructions. Patient is instructed to call or message via MyChart if he/she has any questions or concerns regarding our treatment plan. No barriers to understanding were identified. We discussed Red Flag symptoms and signs in detail. Patient expressed understanding regarding what to do in case of urgent or emergency type symptoms.   Medication list was reconciled, printed and provided to the patient in AVS. Patient instructions and summary information was reviewed with the patient as documented in the AVS. This note was prepared with assistance of Dragon voice recognition software. Occasional wrong-word or sound-a-like substitutions may have occurred due to the inherent  limitations of voice recognition software  Orders Placed This Encounter  Procedures  . HM HIV SCREENING LAB  . HM HEPATITIS C SCREENING LAB  . TSH  . Basic metabolic panel  . HM COLONOSCOPY   Meds ordered this encounter  Medications  . pneumococcal 13-valent conjugate vaccine (PREVNAR 13) SUSP injection    Sig: Inject 0.5 mLs into the muscle once for 1 dose.    Dispense:  0.5 mL    Refill:  0

## 2017-09-09 LAB — TSH: TSH: 0.06 u[IU]/mL — AB (ref 0.35–4.50)

## 2017-09-11 DIAGNOSIS — Z23 Encounter for immunization: Secondary | ICD-10-CM | POA: Diagnosis not present

## 2017-09-11 MED ORDER — LEVOTHYROXINE SODIUM 50 MCG PO TABS: 50.0000 ug | ORAL_TABLET | Freq: Every day | ORAL | 1 refills | Status: DC

## 2017-09-11 NOTE — Addendum Note (Signed)
Addended by: Billey Chang on: 09/11/2017 08:53 AM   Modules accepted: Orders

## 2017-09-11 NOTE — Progress Notes (Signed)
Please call patient: I have reviewed his/her lab results. Kidney function and potassium levels are fine. However, thyroid remains over treated on current dose. We need to decrease the dose of his medication. I have ordered 45mcg to be taken daily. Please schedule a f/u office visit in 6-8 weeks with me to recheck his levels and symptoms.

## 2017-09-28 DIAGNOSIS — Q213 Tetralogy of Fallot: Secondary | ICD-10-CM | POA: Diagnosis not present

## 2017-09-28 DIAGNOSIS — I10 Essential (primary) hypertension: Secondary | ICD-10-CM | POA: Diagnosis not present

## 2017-09-28 DIAGNOSIS — R002 Palpitations: Secondary | ICD-10-CM | POA: Diagnosis not present

## 2017-10-04 DIAGNOSIS — R002 Palpitations: Secondary | ICD-10-CM | POA: Diagnosis not present

## 2017-10-30 ENCOUNTER — Other Ambulatory Visit: Payer: Self-pay

## 2017-10-30 ENCOUNTER — Encounter: Payer: Self-pay | Admitting: Family Medicine

## 2017-10-30 ENCOUNTER — Ambulatory Visit (INDEPENDENT_AMBULATORY_CARE_PROVIDER_SITE_OTHER): Payer: Medicare Other | Admitting: Family Medicine

## 2017-10-30 VITALS — BP 116/78 | HR 56 | Temp 97.7°F | Resp 15 | Ht 66.0 in | Wt 158.4 lb

## 2017-10-30 DIAGNOSIS — E291 Testicular hypofunction: Secondary | ICD-10-CM

## 2017-10-30 DIAGNOSIS — N529 Male erectile dysfunction, unspecified: Secondary | ICD-10-CM | POA: Diagnosis not present

## 2017-10-30 DIAGNOSIS — E890 Postprocedural hypothyroidism: Secondary | ICD-10-CM | POA: Diagnosis not present

## 2017-10-30 NOTE — Patient Instructions (Signed)
Please return in September for your annual complete physical; please come fasting.  We will call you with your thyroid and testosterone level results.   If you have any questions or concerns, please don't hesitate to send me a message via MyChart or call the office at [phone]. Thank you for visiting with [place] today! It's our pleasure caring for you.

## 2017-10-30 NOTE — Progress Notes (Signed)
Subjective  CC:  Chief Complaint  Patient presents with  . Hyperthyroidism    Doing well    HPI: Christopher Mosley is a 72 y.o. male who presents to the office today to address the problems listed above in the chief complaint.  Hypothyroidism f/u: Christopher Mosley is a 72 y.o. male who presents for follow up of hypothyroidism. Last TSH showed control was overtreated, and thyroid supplement medication was adjusted accordingly.  Current symptoms: none . Patient denies change in energy level, diarrhea, heat / cold intolerance, nervousness, palpitations and weight changes. Symptoms have been basically asymptomatic.He has been compliant with the medication. Due for lab recheck. Now on 7mcg daily down from 41mcg daily except on sundays.   HLD - cards changed to lipitor from pravastatin to reduce plaque build up. Tolerating ok.  ED - new problem. Having problems with maintaining an erection x 2 months. Never has had problems in past. On T supplement biweekly. Last injection 9 days ago. Has been well controlled.  Assessment  1. Postoperative hypothyroidism   2. Hypogonadism in male   3. Erectile dysfunction, unspecified erectile dysfunction type      Plan   hypothyroidism:  Recheck levels on lower dose. He is taking as directed.   ED and low T: recheck am levels today. Adjust meds if needed. May need viagra if not improving.   Follow up: Return in about 3 months (around 01/30/2018) for medicare physical with labs.   Orders Placed This Encounter  Procedures  . Testosterone Total,Free,Bio, Males  . TSH   No orders of the defined types were placed in this encounter.     I reviewed the patients updated PMH, FH, and SocHx.    Patient Active Problem List   Diagnosis Date Noted  . Erectile dysfunction 10/30/2017  . History of thyroid cancer 09/08/2017  . Mixed hyperlipidemia 09/08/2017  . Essential hypertension 09/08/2017  . Colon polyps 09/08/2017  . Vocal cord  dysfunction 03/29/2017  . History of Blalock-Taussig shunt 12/15/2015  . Tetralogy of Fallot s/p repair 12/15/2015  . Hypogonadism in male 08/12/2015  . Postoperative hypothyroidism 08/12/2015   Current Meds  Medication Sig  . amLODipine (NORVASC) 5 MG tablet TAKE 1 TABLET BY MOUTH EVERY DAY  . aspirin 81 MG chewable tablet Chew by mouth.  Marland Kitchen atorvastatin (LIPITOR) 40 MG tablet Take 40 mg by mouth daily.  Marland Kitchen levothyroxine (SYNTHROID, LEVOTHROID) 50 MCG tablet Take 1 tablet (50 mcg total) by mouth daily before breakfast.  . testosterone cypionate (DEPOTESTOTERONE CYPIONATE) 100 MG/ML injection Inject into the muscle.    Allergies: Patient is allergic to codeine and sulfa antibiotics. Family History: Patient family history includes Alcohol abuse in his father, maternal grandfather, and mother; Arthritis in his brother; Asthma in his brother; Cancer in his paternal grandfather; Depression in his mother; Diabetes in his brother, father, and mother; Drug abuse in his brother; Early death in his father, maternal grandmother, and mother; Healthy in his daughter and son; Heart attack in his father, maternal grandfather, and maternal grandmother; Heart disease in his father; Hyperlipidemia in his mother; Hypertension in his father and mother; Miscarriages / [place] in his mother; Pancreatic cancer in his father; Renal cancer in his brother; Stomach cancer in his mother; Stroke in his brother. Social History:  Patient  reports that he has never smoked. He has never used smokeless tobacco. He reports that he drinks alcohol. He reports that he does not use drugs.  Review of Systems: Constitutional: Negative  for fever malaise or anorexia Cardiovascular: negative for chest pain Respiratory: negative for SOB or persistent cough Gastrointestinal: negative for abdominal pain Wt Readings from Last 3 Encounters:  10/30/17 158 lb 6.4 oz (71.8 kg)  09/08/17 158 lb (71.7 kg)    Objective  Vitals: BP  116/78   Pulse (!) 56   Temp 97.7 F (36.5 C) (Oral)   Resp 15   Ht 5\' 6"  (1.676 m)   Wt 158 lb 6.4 oz (71.8 kg)   SpO2 98%   BMI 25.57 kg/m  General: no acute distress , A&Ox3 HEENT: PEERL, conjunctiva normal, Oropharynx moist,neck is supple Cardiovascular:  RRR , + murmur Respiratory:  Good breath sounds bilaterally, CTAB with normal respiratory effort Neuro: no tremor Skin:  Warm, no rashes     Commons side effects, risks, benefits, and alternatives for medications and treatment plan prescribed today were discussed, and the patient expressed understanding of the given instructions. Patient is instructed to call or message via MyChart if he/she has any questions or concerns regarding our treatment plan. No barriers to understanding were identified. We discussed Red Flag symptoms and signs in detail. Patient expressed understanding regarding what to do in case of urgent or emergency type symptoms.   Medication list was reconciled, printed and provided to the patient in AVS. Patient instructions and summary information was reviewed with the patient as documented in the AVS. This note was prepared with assistance of Dragon voice recognition software. Occasional wrong-word or sound-a-like substitutions may have occurred due to the inherent limitations of voice recognition software

## 2017-10-31 ENCOUNTER — Other Ambulatory Visit: Payer: Self-pay

## 2017-10-31 DIAGNOSIS — R7989 Other specified abnormal findings of blood chemistry: Secondary | ICD-10-CM

## 2017-10-31 MED ORDER — LEVOTHYROXINE SODIUM 75 MCG PO TABS: 75.0000 ug | ORAL_TABLET | Freq: Every day | ORAL | 2 refills | Status: DC

## 2017-10-31 NOTE — Addendum Note (Signed)
Addended by: Billey Chang on: 10/31/2017 12:07 PM   Modules accepted: Orders

## 2017-10-31 NOTE — Progress Notes (Signed)
Please call patient: I have reviewed his/her lab results. Thyroid test remains off; need to increase dose slightly. Start taking synthroid 7mcg daily and nurse visit for recheck TSH in 8 weeks; please schedule and order tsh. Thanks.

## 2017-11-02 LAB — TESTOSTERONE TOTAL,FREE,BIO, MALES
ALBUMIN MSPROF: 4.7 g/dL (ref 3.6–5.1)
TESTOSTERONE: 343 ng/dL (ref 250–827)

## 2017-11-02 LAB — TSH: TSH: 7.23 m[IU]/L — AB (ref 0.40–4.50)

## 2017-11-06 NOTE — Progress Notes (Signed)
Please call patient: I have reviewed his/her lab results. Testosterone levels are stable. Continue with previous dose.

## 2017-11-13 ENCOUNTER — Emergency Department (HOSPITAL_COMMUNITY)
Admission: EM | Admit: 2017-11-13 | Discharge: 2017-11-13 | Disposition: A | Discharge status: Home / Self Care | Payer: Medicare Other | Attending: Emergency Medicine | Admitting: Emergency Medicine

## 2017-11-13 ENCOUNTER — Encounter (HOSPITAL_COMMUNITY): Payer: Self-pay | Admitting: Emergency Medicine

## 2017-11-13 ENCOUNTER — Ambulatory Visit: Payer: Medicare Other | Admitting: Family Medicine

## 2017-11-13 DIAGNOSIS — Z79899 Other long term (current) drug therapy: Secondary | ICD-10-CM | POA: Insufficient documentation

## 2017-11-13 DIAGNOSIS — E785 Hyperlipidemia, unspecified: Secondary | ICD-10-CM | POA: Diagnosis not present

## 2017-11-13 DIAGNOSIS — I10 Essential (primary) hypertension: Secondary | ICD-10-CM | POA: Diagnosis not present

## 2017-11-13 DIAGNOSIS — M4696 Unspecified inflammatory spondylopathy, lumbar region: Secondary | ICD-10-CM | POA: Diagnosis not present

## 2017-11-13 DIAGNOSIS — M545 Low back pain, unspecified: Secondary | ICD-10-CM

## 2017-11-13 DIAGNOSIS — Z7982 Long term (current) use of aspirin: Secondary | ICD-10-CM | POA: Insufficient documentation

## 2017-11-13 LAB — URINALYSIS, ROUTINE W REFLEX MICROSCOPIC
Bilirubin Urine: NEGATIVE
GLUCOSE, UA: NEGATIVE mg/dL
Hgb urine dipstick: NEGATIVE
KETONES UR: NEGATIVE mg/dL
LEUKOCYTES UA: NEGATIVE
Nitrite: NEGATIVE
PH: 6 (ref 5.0–8.0)
Protein, ur: NEGATIVE mg/dL
Specific Gravity, Urine: 1.014 (ref 1.005–1.030)

## 2017-11-13 MED ORDER — ACETAMINOPHEN ER 650 MG PO TBCR: 650.0000 mg | EXTENDED_RELEASE_TABLET | Freq: Three times a day (TID) | ORAL | 0 refills | Status: AC

## 2017-11-13 MED ORDER — ACETAMINOPHEN 500 MG PO TABS
1000.0000 mg | ORAL_TABLET | Freq: Once | ORAL | Status: AC
Start: 2017-11-13 — End: 2017-11-13
  Administered 2017-11-13: 1000 mg via ORAL
  Filled 2017-11-13: qty 2

## 2017-11-13 MED ORDER — IBUPROFEN 400 MG PO TABS
600.0000 mg | ORAL_TABLET | Freq: Once | ORAL | Status: AC
  Administered 2017-11-13: 600 mg via ORAL
  Filled 2017-11-13: qty 1

## 2017-11-13 MED ORDER — PREDNISONE 20 MG PO TABS
60.0000 mg | ORAL_TABLET | Freq: Once | ORAL | Status: AC
  Administered 2017-11-13: 60 mg via ORAL
  Filled 2017-11-13: qty 3

## 2017-11-13 NOTE — ED Provider Notes (Signed)
[facility] EMERGENCY DEPARTMENT Provider Note   CSN: [id number] Arrival date & time: 11/13/17  2025     History   Chief Complaint Chief Complaint  Patient presents with  . Back Pain    HPI Christopher Mosley is a 72 y.o. male.  HPI Patient is a 72 year old male who presents to the emergency department with complaints of low back pain that radiates around to his bilateral groin regions.  Denies testicular pain.  No urinary symptoms.  Denies abdominal pain.  Denies nausea vomiting diarrhea.  No chest pain or shortness of breath.  No recent heavy lifting or change in his exercise regimen.  He was sitting in a car when his pain began.  His pain is been constant and mild to moderate in severity.  It began yesterday evening has been constant through the night.  Is not worsening nor is it improving.  He has not tried any medication prior to arrival as he does not like to take medication.  He reports there is not a position that worsens or improves his pain.  When sitting up from the bed for examination however he states there is some discomfort with sitting up.  No new rash.  No other complaints per the patient and no other information provided by the spouse   Past Medical History:  Diagnosis Date  . Colon polyp   . Colon polyps 09/08/2017   q 5 years; need records  . Heart murmur   . History of hepatitis B   . Hyperlipemia   . Hypertension   . Lesion of vocal cord 02/06/2017   Right granuloma; resolved on f/u  . Melanoma ([place])   . Papillary thyroid carcinoma ([place])    s/p thyroidectomy  . Urine incontinence     Patient Active Problem List   Diagnosis Date Noted  . Erectile dysfunction 10/30/2017  . History of thyroid cancer 09/08/2017  . Mixed hyperlipidemia 09/08/2017  . Essential hypertension 09/08/2017  . Colon polyps 09/08/2017  . Vocal cord dysfunction 03/29/2017  . History of Blalock-Taussig shunt 12/15/2015  . Tetralogy of Fallot s/p repair  12/15/2015  . Hypogonadism in male 08/12/2015  . Postoperative hypothyroidism 08/12/2015    Past Surgical History:  Procedure Laterality Date  . BLADDER SURGERY  2016  . [place]  . MELANOMA EXCISION    . Pottts shunt  1957  . PULMONARY VALVE REPLACEMENT  10/21/2016   pig valve  . TETRALOGY OF FALLOT REPAIR  1961  . TOTAL KNEE ARTHROPLASTY Left 2016  . TOTAL THYROIDECTOMY  2002        Home Medications    Prior to Admission medications   Medication Sig Start Date End Date Taking? Authorizing Provider  acetaminophen (TYLENOL 8 HOUR) 650 MG CR tablet Take 1 tablet (650 mg total) by mouth every 8 (eight) hours for 9 doses. 11/13/17 11/16/17  Jola Schmidt, MD  amLODipine (NORVASC) 5 MG tablet TAKE 1 TABLET BY MOUTH EVERY DAY 11/03/16   [provider]  aspirin 81 MG chewable tablet Chew by mouth.    [provider]  atorvastatin (LIPITOR) 40 MG tablet Take 40 mg by mouth daily. 09/29/17   [provider]  levothyroxine (SYNTHROID, LEVOTHROID) 75 MCG tablet Take 1 tablet (75 mcg total) by mouth daily before breakfast. 10/31/17   Leamon Arnt, MD  testosterone cypionate (DEPOTESTOTERONE CYPIONATE) 100 MG/ML injection Inject into the muscle.    [provider]    Family History  Family History  Problem Relation Age of Onset  . Alcohol abuse Mother   . Diabetes Mother   . Depression Mother   . Early death Mother   . Hyperlipidemia Mother   . Miscarriages / [place] Mother   . Hypertension Mother   . Stomach cancer Mother   . Alcohol abuse Father   . Diabetes Father   . Early death Father   . Heart attack Father   . Heart disease Father   . Hypertension Father   . Pancreatic cancer Father   . Arthritis Brother   . Diabetes Brother   . Stroke Brother   . Renal cancer Brother   . Early death Maternal Grandmother   . Heart attack Maternal Grandmother   . Alcohol abuse Maternal Grandfather   . Heart attack Maternal  Grandfather   . Cancer Paternal Grandfather   . Asthma Brother   . Drug abuse Brother   . Healthy Daughter   . Healthy Son     Social History Social History   Tobacco Use  . Smoking status: Never Smoker  . Smokeless tobacco: Never Used  Substance Use Topics  . Alcohol use: Yes  . Drug use: Never     Allergies   Codeine and Sulfa antibiotics   Review of Systems Review of Systems  All other systems reviewed and are negative.    Physical Exam Updated Vital Signs BP (!) 174/89 (BP Location: Right Arm)   Pulse 65   Temp 98.2 F (36.8 C) (Oral)   Resp 16   Ht 5\' 6"  (1.676 m)   Wt 71.2 kg (157 lb)   SpO2 100%   BMI 25.34 kg/m   Physical Exam  Constitutional: He is oriented to person, place, and time. He appears well-developed and well-nourished.  HENT:  Head: Normocephalic and atraumatic.  Eyes: EOM are normal.  Neck: Normal range of motion.  Cardiovascular: Normal rate, regular rhythm, normal heart sounds and intact distal pulses.  Pulmonary/Chest: Effort normal and breath sounds normal. No respiratory distress.  Abdominal: Soft. He exhibits no distension. There is no tenderness.  Musculoskeletal: Normal range of motion.  No thoracic or lumbar point tenderness.  No parathoracic or paralumbar tenderness.  Full range of motion of his bilateral lower extremity major joints.  Normal PT and DP pulses bilaterally.  No weakness of the major muscle groups of his lower extremities.  Neurological: He is alert and oriented to person, place, and time.  Skin: Skin is warm and dry.  No signs of zoster  Psychiatric: He has a normal mood and affect. Judgment normal.  Nursing note and vitals reviewed.    ED Treatments / Results  Labs (all labs ordered are listed, but only abnormal results are displayed) Labs Reviewed  URINALYSIS, ROUTINE W REFLEX MICROSCOPIC    EKG None  Radiology No results found.  Procedures Procedures (including critical care  time)  Medications Ordered in ED Medications  ibuprofen (ADVIL,MOTRIN) tablet 600 mg (600 mg Oral Given 11/13/17 0744)  predniSONE (DELTASONE) tablet 60 mg (60 mg Oral Given 11/13/17 0744)  acetaminophen (TYLENOL) tablet 1,000 mg (1,000 mg Oral Given 11/13/17 0744)     Initial Impression / Assessment and Plan / ED Course  I have reviewed the triage vital signs and the nursing notes.  Pertinent labs & imaging results that were available during my care of the patient were reviewed by me and considered in my medical decision making (see chart for details).     Low back  pain without any other associated symptoms.  Urine shows no signs of infection or blood.  Abdominal exam is benign.  Patient is comfortable in appearance.  He walks without difficulty.  There is no signs of zoster at this time.  Is unclear the etiology of his back pain however he has not tried any medication prior to arrival.  I think is reasonable at this time to start with conservative therapy with anti-inflammatories.  He states he is unable to take ibuprofen and is usually told to take Tylenol for pain.  I have given him a single dose of prednisone and recommended 3 times daily Tylenol x3 days.  Close primary care follow-up.  We long discussion regarding return precautions and return to the emergency department for further evaluation for new or worsening symptoms.  At this time is unclear the etiology of his pain this is likely musculoskeletal pain but time will better determine the etiology of his pain.  He understands that his symptoms could worsen and that he will need prompt follow-up for any new or worsening symptoms  Final Clinical Impressions(s) / ED Diagnoses   Final diagnoses:  Acute bilateral low back pain without sciatica    ED Discharge Orders        Ordered    acetaminophen (TYLENOL 8 HOUR) 650 MG CR tablet  Every 8 hours     11/13/17 [place], Aubriel Khanna, MD 11/13/17 [phone]

## 2017-11-13 NOTE — ED Triage Notes (Signed)
Reports low back pain that radiates around both sides.  Denies any urinary symptoms.

## 2017-11-13 NOTE — ED Notes (Signed)
UA sent with culture 

## 2017-11-16 ENCOUNTER — Encounter: Payer: Self-pay | Admitting: Family Medicine

## 2017-11-16 ENCOUNTER — Other Ambulatory Visit: Payer: Self-pay

## 2017-11-16 ENCOUNTER — Ambulatory Visit (INDEPENDENT_AMBULATORY_CARE_PROVIDER_SITE_OTHER): Payer: Medicare Other | Admitting: Family Medicine

## 2017-11-16 VITALS — BP 130/72 | HR 58 | Temp 98.5°F | Ht 66.0 in | Wt 156.8 lb

## 2017-11-16 DIAGNOSIS — M4726 Other spondylosis with radiculopathy, lumbar region: Secondary | ICD-10-CM | POA: Diagnosis not present

## 2017-11-16 DIAGNOSIS — M545 Low back pain, unspecified: Secondary | ICD-10-CM

## 2017-11-16 MED ORDER — DICLOFENAC SODIUM 75 MG PO TBEC: 75.0000 mg | DELAYED_RELEASE_TABLET | Freq: Two times a day (BID) | ORAL | 0 refills | Status: DC

## 2017-11-16 MED ORDER — HYDROCODONE-ACETAMINOPHEN 5-325 MG PO TABS: 1.0000 | ORAL_TABLET | Freq: Four times a day (QID) | ORAL | 0 refills | Status: AC | PRN

## 2017-11-16 NOTE — Patient Instructions (Signed)
Please follow up if symptoms do not improve or as needed.   Start the back exercises prescribed by orhtopedics.

## 2017-11-16 NOTE — Progress Notes (Signed)
Subjective  CC:  Chief Complaint  Patient presents with  . Back Pain    saw Orthopedic Doctor  on Monday, on Tramodol and Prednesone, Pain is no Better. Pain in Lumbar section of back     HPI: Christopher Mosley is a 72 y.o. male who presents to the office today to address the problems listed above in the chief complaint.  72 yo with low back pain x 3.5days. Was evaluated in ER and at [facility] on 7/8. Treated with pred and ultram. Reports xrays showed lumbar djd. No radicular sxs. Reports constant 6/10 pain but has appeared comfortable. Reports tried to exercise at gym yesterday and couldn't complete workout and had to walk slower than normal on treadmill due to pain. No leg pain or b/b dysfunction. pred and ultram are not helping. No injury. He is concerned it is not better; has to have a cardiac MRI next week and needs to be able to lie flat for 45 minutes.    Assessment  1. Acute midline low back pain without sciatica   2. Osteoarthritis of spine with radiculopathy, lumbar region      Plan   bp:  Djd: no red flags. Change to diclofenac and norco. Educated on appropriate expectations. Hopeful things will start to improve. He appears well and comfortable now; explained that it could take 1-2 weeks for sxs to improve. Pt declines mm relaxer. Recommend sleeping on side or back rather than trying to sleep on stomach while his low back is hurting. Has f/u with ortho scheduled.   Follow up: prn   No orders of the defined types were placed in this encounter.  Meds ordered this encounter  Medications  . HYDROcodone-acetaminophen (NORCO) 5-325 MG tablet    Sig: Take 1 tablet by mouth every 6 (six) hours as needed for up to 5 days for moderate pain.    Dispense:  20 tablet    Refill:  0  . diclofenac (VOLTAREN) 75 MG EC tablet    Sig: Take 1 tablet (75 mg total) by mouth 2 (two) times daily.    Dispense:  30 tablet    Refill:  0      I reviewed the patients updated PMH, FH,  and SocHx.    Patient Active Problem List   Diagnosis Date Noted  . Erectile dysfunction 10/30/2017  . History of thyroid cancer 09/08/2017  . Mixed hyperlipidemia 09/08/2017  . Essential hypertension 09/08/2017  . Colon polyps 09/08/2017  . Vocal cord dysfunction 03/29/2017  . History of Blalock-Taussig shunt 12/15/2015  . Tetralogy of Fallot s/p repair 12/15/2015  . Hypogonadism in male 08/12/2015  . Postoperative hypothyroidism 08/12/2015   Current Meds  Medication Sig  . acetaminophen (TYLENOL 8 HOUR) 650 MG CR tablet Take 1 tablet (650 mg total) by mouth every 8 (eight) hours for 9 doses.  Marland Kitchen amLODipine (NORVASC) 5 MG tablet TAKE 1 TABLET BY MOUTH EVERY DAY  . aspirin 81 MG chewable tablet Chew by mouth.  Marland Kitchen atorvastatin (LIPITOR) 40 MG tablet Take 40 mg by mouth daily.  Marland Kitchen levothyroxine (SYNTHROID, LEVOTHROID) 75 MCG tablet Take 1 tablet (75 mcg total) by mouth daily before breakfast.  . testosterone cypionate (DEPOTESTOTERONE CYPIONATE) 100 MG/ML injection Inject into the muscle.    Allergies: Patient is allergic to codeine and sulfa antibiotics. Family History: Patient family history includes Alcohol abuse in his father, maternal grandfather, and mother; Arthritis in his brother; Asthma in his brother; Cancer in his paternal grandfather; Depression in  his mother; Diabetes in his brother, father, and mother; Drug abuse in his brother; Early death in his father, maternal grandmother, and mother; Healthy in his daughter and son; Heart attack in his father, maternal grandfather, and maternal grandmother; Heart disease in his father; Hyperlipidemia in his mother; Hypertension in his father and mother; Miscarriages / [place] in his mother; Pancreatic cancer in his father; Renal cancer in his brother; Stomach cancer in his mother; Stroke in his brother. Social History:  Patient  reports that he has never smoked. He has never used smokeless tobacco. He reports that he drinks alcohol.  He reports that he does not use drugs.  Review of Systems: Constitutional: Negative for fever malaise or anorexia Cardiovascular: negative for chest pain Respiratory: negative for SOB or persistent cough Gastrointestinal: negative for abdominal pain  Objective  Vitals: BP 130/72   Pulse (!) 58   Temp 98.5 F (36.9 C)   Ht 5\' 6"  (1.676 m)   Wt 156 lb 12.8 oz (71.1 kg)   SpO2 96%   BMI 25.31 kg/m  General: no acute distress , A&Ox3. Moves well. Appears comfortable Back: no spasm, ROM: mildly limited w/o pain. No spinal ttp. Neg SLR bilaterally. Nl gait.    Commons side effects, risks, benefits, and alternatives for medications and treatment plan prescribed today were discussed, and the patient expressed understanding of the given instructions. Patient is instructed to call or message via MyChart if he/she has any questions or concerns regarding our treatment plan. No barriers to understanding were identified. We discussed Red Flag symptoms and signs in detail. Patient expressed understanding regarding what to do in case of urgent or emergency type symptoms.   Medication list was reconciled, printed and provided to the patient in AVS. Patient instructions and summary information was reviewed with the patient as documented in the AVS. This note was prepared with assistance of Dragon voice recognition software. Occasional wrong-word or sound-a-like substitutions may have occurred due to the inherent limitations of voice recognition software

## 2017-11-20 ENCOUNTER — Telehealth: Payer: Self-pay | Admitting: Family Medicine

## 2017-11-20 NOTE — Telephone Encounter (Signed)
Copied from [place] [phone]. Topic: Quick Communication - See Telephone Encounter >> Nov 20, 2017  5:26 PM Percell Belt A wrote: CRM for notification. See Telephone encounter for: 11/20/17. Pt called in and state that Dr just put him on Hydrocodone and Diclofenac.  He stated that he just went to bathroom and there was little bit of blood in his urine.  No other symptoms per pt.  Back is feeling better would like to know if he needs to stop taking these med or what he should do?   Best number [phone]

## 2017-11-21 DIAGNOSIS — Q213 Tetralogy of Fallot: Secondary | ICD-10-CM | POA: Diagnosis not present

## 2017-11-21 DIAGNOSIS — Z953 Presence of xenogenic heart valve: Secondary | ICD-10-CM | POA: Diagnosis not present

## 2017-11-21 DIAGNOSIS — I700 Atherosclerosis of aorta: Secondary | ICD-10-CM | POA: Diagnosis not present

## 2017-11-21 DIAGNOSIS — I77810 Thoracic aortic ectasia: Secondary | ICD-10-CM | POA: Diagnosis not present

## 2017-11-21 NOTE — Telephone Encounter (Signed)
Please Advise

## 2017-11-21 NOTE — Telephone Encounter (Signed)
Patient informed. He stated after discontinuing the medication the bleeding stopped. Advised to Call Back if he notices anymore blood or problem persists. Patient verbalized understanding.    Doloris Hall,  LPN

## 2017-11-21 NOTE — Telephone Encounter (Signed)
Monitor urine and if blood persists, then schedule an office visit.  No need to continue medications if he is no longer having pain.

## 2017-11-27 ENCOUNTER — Other Ambulatory Visit: Payer: Self-pay

## 2017-11-27 ENCOUNTER — Ambulatory Visit (INDEPENDENT_AMBULATORY_CARE_PROVIDER_SITE_OTHER): Payer: Medicare Other | Admitting: Family Medicine

## 2017-11-27 ENCOUNTER — Encounter: Payer: Self-pay | Admitting: Family Medicine

## 2017-11-27 VITALS — BP 120/72 | HR 56 | Temp 98.3°F | Ht 66.0 in | Wt 155.8 lb

## 2017-11-27 DIAGNOSIS — R310 Gross hematuria: Secondary | ICD-10-CM

## 2017-11-27 LAB — URINALYSIS, ROUTINE W REFLEX MICROSCOPIC
Bilirubin Urine: NEGATIVE
Ketones, ur: NEGATIVE
Nitrite: NEGATIVE
PH: 6 (ref 5.0–8.0)
Specific Gravity, Urine: <=1.005 — AB (ref 1.000–1.030)
TOTAL PROTEIN, URINE-UPE24: NEGATIVE
URINE GLUCOSE: NEGATIVE
UROBILINOGEN UA: 0.2 (ref 0.0–1.0)

## 2017-11-27 LAB — POCT URINALYSIS DIPSTICK
BILIRUBIN UA: NEGATIVE
Glucose, UA: NEGATIVE
Ketones, UA: NEGATIVE
Leukocytes, UA: NEGATIVE
NITRITE UA: NEGATIVE
PH UA: 6 (ref 5.0–8.0)
PROTEIN UA: NEGATIVE
Spec Grav, UA: 1.01 (ref 1.010–1.025)
UROBILINOGEN UA: 0.2 U/dL

## 2017-11-27 NOTE — Patient Instructions (Addendum)
Please follow up if symptoms do not improve or as needed.  We will call you with your urine test results.  IF your blood in the urine persists, I will set you up with an urologist to take a look.  Hematuria, Adult Hematuria is blood in your urine. It can be caused by a bladder infection, kidney infection, prostate infection, kidney stone, or cancer of your urinary tract. Infections can usually be treated with medicine, and a kidney stone usually will pass through your urine. If neither of these is the cause of your hematuria, further workup to find out the reason may be needed. It is very important that you tell your health care provider about any blood you see in your urine, even if the blood stops without treatment or happens without causing pain. Blood in your urine that happens and then stops and then happens again can be a symptom of a very serious condition. Also, pain is not a symptom in the initial stages of many urinary cancers. Follow these instructions at home:  Drink lots of fluid, 3-4 quarts a day. If you have been diagnosed with an infection, cranberry juice is especially recommended, in addition to large amounts of water.  Avoid caffeine, tea, and carbonated beverages because they tend to irritate the bladder.  Avoid alcohol because it may irritate the prostate.  Take all medicines as directed by your health care provider.  If you were prescribed an antibiotic medicine, finish it all even if you start to feel better.  If you have been diagnosed with a kidney stone, follow your health care provider's instructions regarding straining your urine to catch the stone.  Empty your bladder often. Avoid holding urine for long periods of time.  After a bowel movement, women should cleanse front to back. Use each tissue only once.  Empty your bladder before and after sexual intercourse if you are a male. Contact a health care provider if:  You develop back pain.  You have a  fever.  You have a feeling of sickness in your stomach (nausea) or vomiting.  Your symptoms are not better in 3 days. Return sooner if you are getting worse. Get help right away if:  You develop severe vomiting and are unable to keep the medicine down.  You develop severe back or abdominal pain despite taking your medicines.  You begin passing a large amount of blood or clots in your urine.  You feel extremely weak or faint, or you pass out. This information is not intended to replace advice given to you by your health care provider. Make sure you discuss any questions you have with your health care provider. Document Released: 04/25/2005 Document Revised: 10/01/2015 Document Reviewed: 12/24/2012 Elsevier Interactive Patient Education  2017 [employer].

## 2017-11-27 NOTE — Progress Notes (Signed)
Subjective  CC:  Chief Complaint  Patient presents with  . Hematuria    patient states that blood went away, and came back 4 days later, states there is clots in urine    HPI: Christopher Mosley is a 72 y.o. male who presents to the office today to address the problems listed above in the chief complaint.  72 year old male with history of mid line low back pain evaluated last week.  ER evaluation showed normal urine.  Treated with diclofenac.  Since, back pain has almost completely resolved.  However he has gross hematuria once a day for the last 5 to 6 days.  He denies urinary symptoms, specifically dysuria, urinary frequency, hesitancy or flank pain.  He has had no fevers.  No history of renal stones.  Has had history of bladder surgery but was released from urology a year ago.  He otherwise feels fine.  No longer on any anti-inflammatory.  He does take baby aspirin daily.  Assessment  1. Gross hematuria      Plan   Gross hematuria: Sent for urinalysis with micro, check culture.  Monitor for resolution.  If persist, needs urologic evaluation.  If resolved, will recheck urinalysis at next visit in September.  Patient understands and agrees with care plan  Follow up: Return for as scheduled.   Orders Placed This Encounter  Procedures  . Urine Culture  . Urinalysis, Routine w reflex microscopic  . POCT Urinalysis Dipstick   No orders of the defined types were placed in this encounter.     I reviewed the patients updated PMH, FH, and SocHx.    Patient Active Problem List   Diagnosis Date Noted  . Erectile dysfunction 10/30/2017  . History of thyroid cancer 09/08/2017  . Mixed hyperlipidemia 09/08/2017  . Essential hypertension 09/08/2017  . Colon polyps 09/08/2017  . Vocal cord dysfunction 03/29/2017  . History of Blalock-Taussig shunt 12/15/2015  . Tetralogy of Fallot s/p repair 12/15/2015  . Hypogonadism in male 08/12/2015  . Postoperative hypothyroidism  08/12/2015   No outpatient medications have been marked as taking for the 11/27/17 encounter (Office Visit) with Leamon Arnt, MD.    Allergies: Patient is allergic to codeine and sulfa antibiotics. Family History: Patient family history includes Alcohol abuse in his father, maternal grandfather, and mother; Arthritis in his brother; Asthma in his brother; Cancer in his paternal grandfather; Depression in his mother; Diabetes in his brother, father, and mother; Drug abuse in his brother; Early death in his father, maternal grandmother, and mother; Healthy in his daughter and son; Heart attack in his father, maternal grandfather, and maternal grandmother; Heart disease in his father; Hyperlipidemia in his mother; Hypertension in his father and mother; Miscarriages / [place] in his mother; Pancreatic cancer in his father; Renal cancer in his brother; Stomach cancer in his mother; Stroke in his brother. Social History:  Patient  reports that he has never smoked. He has never used smokeless tobacco. He reports that he drinks alcohol. He reports that he does not use drugs.  Review of Systems: Constitutional: Negative for fever malaise or anorexia Cardiovascular: negative for chest pain Respiratory: negative for SOB or persistent cough Gastrointestinal: negative for abdominal pain  Objective  Vitals: BP 120/72   Pulse (!) 56   Temp 98.3 F (36.8 C)   Ht 5\' 6"  (1.676 m)   Wt 155 lb 12.8 oz (70.7 kg)   SpO2 96%   BMI 25.15 kg/m  General: no acute distress ,  A&Ox3 Back: Full range of motion, nontender, moves well.  No flank tenderness.   Abdomen: Soft, nontender, no masses, no suprapubic tenderness  skin:  Warm, no rashes  Office Visit on 11/27/2017  Component Date Value Ref Range Status  . Color, UA 11/27/2017 yellow   Final  . Clarity, UA 11/27/2017 clear   Final  . Glucose, UA 11/27/2017 Negative  Negative Final  . Bilirubin, UA 11/27/2017 negative   Final  . Ketones, UA  11/27/2017 negative   Final  . Spec Grav, UA 11/27/2017 1.010  1.010 - 1.025 Final  . Blood, UA 11/27/2017 1+   Final  . pH, UA 11/27/2017 6.0  5.0 - 8.0 Final  . Protein, UA 11/27/2017 Negative  Negative Final  . Urobilinogen, UA 11/27/2017 0.2  0.2 or 1.0 E.U./dL Final  . Nitrite, UA 11/27/2017 negative   Final  . Leukocytes, UA 11/27/2017 Negative  Negative Final      Commons side effects, risks, benefits, and alternatives for medications and treatment plan prescribed today were discussed, and the patient expressed understanding of the given instructions. Patient is instructed to call or message via MyChart if he/she has any questions or concerns regarding our treatment plan. No barriers to understanding were identified. We discussed Red Flag symptoms and signs in detail. Patient expressed understanding regarding what to do in case of urgent or emergency type symptoms.   Medication list was reconciled, printed and provided to the patient in AVS. Patient instructions and summary information was reviewed with the patient as documented in the AVS. This note was prepared with assistance of Dragon voice recognition software. Occasional wrong-word or sound-a-like substitutions may have occurred due to the inherent limitations of voice recognition software

## 2017-11-29 ENCOUNTER — Other Ambulatory Visit: Payer: Self-pay | Admitting: Family Medicine

## 2017-11-29 LAB — URINE CULTURE
MICRO NUMBER:: 90864684
Result:: NO GROWTH
SPECIMEN QUALITY: ADEQUATE

## 2017-11-29 NOTE — Progress Notes (Signed)
Please call patient: I have reviewed his/her lab results. His urine test showed a few RBC, but minimal. Urine culture was negative for infection. Follow instruction discussed in office: if persists or recurs: needs urology; if resolves, I will recheck in September. Avoid nsaids for now.

## 2017-11-29 NOTE — Telephone Encounter (Signed)
Copied from [place] [phone]. Topic: Quick Communication - Rx Refill/Question >> Nov 29, 2017  3:38 PM Burchel, Abbi R wrote: Medication:  testosterone cypionate (DEPOTESTOTERONE CYPIONATE) 100 MG/ML injection   Preferred Pharmacy: [facility] [address], [place] - [address]  [phone] (Phone) [phone] (Fax)  Pt requesting refill. Pt stated he has 1 dose left.

## 2017-12-01 MED ORDER — TESTOSTERONE CYPIONATE 200 MG/ML IM SOLN: 100.0000 mg | INTRAMUSCULAR | 5 refills | Status: DC

## 2017-12-01 NOTE — Telephone Encounter (Signed)
Please call patient or pharmacy to clarify his dosing for refill. Then may refill

## 2017-12-01 NOTE — Telephone Encounter (Signed)
LOV  11/27/17 Dr. Jonni Sanger

## 2017-12-01 NOTE — Telephone Encounter (Signed)
Called patient and he stated that he is receiving the testosterone 200 mg/20 mL size and is taking 0.5 ml injection ever 2 weeks.  Current request is for 100 mg/10 mL

## 2017-12-01 NOTE — Telephone Encounter (Signed)
Medication has no provider associated with it.

## 2017-12-19 ENCOUNTER — Telehealth: Payer: Self-pay | Admitting: Family Medicine

## 2017-12-19 MED ORDER — TESTOSTERONE CYPIONATE 200 MG/ML IM SOLN: 100.0000 mg | INTRAMUSCULAR | 5 refills | Status: DC

## 2017-12-19 NOTE — Telephone Encounter (Signed)
Copied from [place] [id number]. Topic: Quick Communication - See Telephone Encounter >> Dec 19, 2017 10:19 AM Bea Graff, NT wrote: CRM for notification. See Telephone encounter for: 12/19/17. Pt states that he normally takes the testosterone cypionate (DEPOTESTOSTERONE CYPIONATE) 200 MG/ML injection every 2 weeks but when he picked up the medication it states every 28 days. He would like a call with instructions.

## 2017-12-19 NOTE — Telephone Encounter (Signed)
Sig changed to q 2 weeks and please approve and send to pharmacy   Doloris Hall,  LPN

## 2017-12-19 NOTE — Telephone Encounter (Signed)
Last OV 11/27/17, Next OV 01/09/18  Last filled 12/01/17, 10 mL with 5 refills

## 2017-12-19 NOTE — Telephone Encounter (Signed)
Patient states he is doing 100mg   twice per month

## 2017-12-19 NOTE — Telephone Encounter (Signed)
Please correct sig on chart and set up for refill.  Notify patient.

## 2018-01-09 ENCOUNTER — Encounter: Payer: PRIVATE HEALTH INSURANCE | Admitting: Family Medicine

## 2018-01-10 ENCOUNTER — Encounter: Payer: Self-pay | Admitting: Family Medicine

## 2018-01-10 ENCOUNTER — Ambulatory Visit (INDEPENDENT_AMBULATORY_CARE_PROVIDER_SITE_OTHER): Payer: Medicare Other | Admitting: Family Medicine

## 2018-01-10 ENCOUNTER — Other Ambulatory Visit: Payer: Self-pay

## 2018-01-10 VITALS — BP 122/80 | HR 64 | Temp 97.9°F | Ht 66.0 in | Wt 155.4 lb

## 2018-01-10 DIAGNOSIS — R310 Gross hematuria: Secondary | ICD-10-CM

## 2018-01-10 DIAGNOSIS — E890 Postprocedural hypothyroidism: Secondary | ICD-10-CM

## 2018-01-10 DIAGNOSIS — I10 Essential (primary) hypertension: Secondary | ICD-10-CM

## 2018-01-10 DIAGNOSIS — E291 Testicular hypofunction: Secondary | ICD-10-CM | POA: Diagnosis not present

## 2018-01-10 DIAGNOSIS — Z85850 Personal history of malignant neoplasm of thyroid: Secondary | ICD-10-CM

## 2018-01-10 DIAGNOSIS — Z8774 Personal history of (corrected) congenital malformations of heart and circulatory system: Secondary | ICD-10-CM

## 2018-01-10 DIAGNOSIS — E782 Mixed hyperlipidemia: Secondary | ICD-10-CM | POA: Diagnosis not present

## 2018-01-10 LAB — COMPREHENSIVE METABOLIC PANEL
ALT: 13 U/L (ref 0–53)
AST: 19 U/L (ref 0–37)
Albumin: 4.6 g/dL (ref 3.5–5.2)
Alkaline Phosphatase: 71 U/L (ref 39–117)
BUN: 18 mg/dL (ref 6–23)
CO2: 33 meq/L — AB (ref 19–32)
Calcium: 9.4 mg/dL (ref 8.4–10.5)
Chloride: 101 mEq/L (ref 96–112)
Creatinine, Ser: 1.26 mg/dL (ref 0.40–1.50)
GFR: 60.1 mL/min (ref >60.00)
GLUCOSE: 97 mg/dL (ref 70–99)
Potassium: 4.8 mEq/L (ref 3.5–5.1)
SODIUM: 140 meq/L (ref 135–145)
Total Bilirubin: 1.3 mg/dL — ABNORMAL HIGH (ref 0.2–1.2)
Total Protein: 6.5 g/dL (ref 6.0–8.3)

## 2018-01-10 LAB — CBC WITH DIFFERENTIAL/PLATELET
BASOS ABS: 0 10*3/uL (ref 0.0–0.1)
Basophils Relative: 0.5 % (ref 0.0–3.0)
EOS PCT: 3.1 % (ref 0.0–5.0)
Eosinophils Absolute: 0.2 10*3/uL (ref 0.0–0.7)
HCT: 48.6 % (ref 39.0–52.0)
Hemoglobin: 16.3 g/dL (ref 13.0–17.0)
LYMPHS ABS: 3 10*3/uL (ref 0.7–4.0)
Lymphocytes Relative: 37.9 % (ref 12.0–46.0)
MCHC: 33.6 g/dL (ref 30.0–36.0)
MCV: 88.1 fl (ref 78.0–100.0)
MONOS PCT: 10.7 % (ref 3.0–12.0)
Monocytes Absolute: 0.8 10*3/uL (ref 0.1–1.0)
NEUTROS ABS: 3.8 10*3/uL (ref 1.4–7.7)
NEUTROS PCT: 47.8 % (ref 43.0–77.0)
PLATELETS: 247 10*3/uL (ref 150.0–400.0)
RBC: 5.51 Mil/uL (ref 4.22–5.81)
RDW: 15.6 % — ABNORMAL HIGH (ref 11.5–15.5)
WBC: 7.9 10*3/uL (ref 4.0–10.5)

## 2018-01-10 LAB — LIPID PANEL
CHOLESTEROL: 136 mg/dL (ref 0–200)
HDL: 32 mg/dL — ABNORMAL LOW (ref >39.00)
LDL CALC: 91 mg/dL (ref 0–99)
NonHDL: 104.35
TRIGLYCERIDES: 67 mg/dL (ref 0.0–149.0)
Total CHOL/HDL Ratio: 4
VLDL: 13.4 mg/dL (ref 0.0–40.0)

## 2018-01-10 LAB — POCT URINALYSIS DIPSTICK
Bilirubin, UA: NEGATIVE
Blood, UA: NEGATIVE
Glucose, UA: NEGATIVE
Ketones, UA: NEGATIVE
LEUKOCYTES UA: NEGATIVE
Nitrite, UA: NEGATIVE
PROTEIN UA: NEGATIVE
Spec Grav, UA: 1.015 (ref 1.010–1.025)
Urobilinogen, UA: 0.2 E.U./dL
pH, UA: 6 (ref 5.0–8.0)

## 2018-01-10 LAB — TSH: TSH: 2.69 u[IU]/mL (ref 0.35–4.50)

## 2018-01-10 NOTE — Progress Notes (Signed)
Subjective  Chief Complaint  Patient presents with  . Annual Exam    doing well, no complaints, declines flu shot     HPI: Christopher Mosley is a 72 y.o. male who presents to [place] [facility] today for a Male Wellness Visit. He also has the concerns and/or needs as listed above in the chief complaint. These will be addressed in addition to the Health Maintenance Visit.   Wellness Visit: annual visit with health maintenance review and exam    Doing great. Exercises regularly at gym. Eats well. Feels well. No more back pain or gross hematuria  Declines flu vaccination: had it 3x/ before and "always got the flu".  Lifestyle: Body mass index is 25.08 kg/m. Wt Readings from Last 3 Encounters:  01/10/18 155 lb 6.4 oz (70.5 kg)  11/27/17 155 lb 12.8 oz (70.7 kg)  11/16/17 156 lb 12.8 oz (71.1 kg)   Diet: low fat Exercise: intermittently,   Chronic disease management visit and/or acute problem visit:  Chronic problems: HTN, HLD, low T, low thyroid: on meds and takes regularly w/o AEs. No sxs cad,chf, low thyroid or fatigue. Reports good lipid levels in June.   Reviewed notes regarding gross hematuria. Reports it stopped the last time he saw me.    Health Maintenance  Topic Date Due  . INFLUENZA VACCINE  12/07/2017  . COLONOSCOPY  07/11/2018  . PNA vac Low Risk Adult (2 of 2 - PCV13) 09/12/2018  . TETANUS/TDAP  12/14/2025  . Hepatitis C Screening  Completed   Immunization History  Administered Date(s) Administered  . Pneumococcal Polysaccharide-23 05/09/2008, 05/09/2013  . Pneumococcal-Unspecified 09/11/2017  . Tdap 12/15/2015  . Zoster 05/09/2013   We updated and reviewed the patient's past history in detail and it is documented below. Allergies: Patient is allergic to codeine and sulfa antibiotics. 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 Past Surgical History Patient  has a past surgical history that includes Blalock procedure [phone]); Tetralogy of Fallot repair [phone]); Pottts shunt (1957); Melanoma excision; Total knee arthroplasty (Left, 2016); Total thyroidectomy (2002); Bladder surgery (2016); and Pulmonary valve replacement (10/21/2016). Social History Patient  reports that he has never smoked. He has never used smokeless tobacco. He reports that he drinks alcohol. He reports that he does not use drugs. Family History family history includes Alcohol abuse in his father, maternal grandfather, and mother; Arthritis in his brother; Asthma in his brother; Cancer in his paternal grandfather; Depression in his mother; Diabetes in his brother, father, and mother; Drug abuse in his brother; Early death in his father, maternal grandmother, and mother; Healthy in his daughter and son; Heart attack in his father, maternal grandfather, and maternal grandmother; Heart disease in his father; Hyperlipidemia in his mother; Hypertension in his father and mother; Miscarriages / [place] in his mother; Pancreatic cancer in his father; Renal cancer in his brother; Stomach cancer in his mother; Stroke in his brother. Review of Systems: Constitutional: negative for fever or malaise Ophthalmic: negative for photophobia, double vision or loss of vision Cardiovascular: negative for chest pain, dyspnea on exertion, or new LE swelling Respiratory: negative for SOB or persistent cough Gastrointestinal: negative for abdominal pain, change in bowel habits or melena Genitourinary: negative for dysuria or gross hematuria Musculoskeletal: negative for new gait disturbance or muscular weakness Integumentary:  negative for new or persistent rashes Neurological: negative for TIA or stroke symptoms Psychiatric: negative for SI or  delusions Allergic/Immunologic: negative for hives  Patient Care Team    Relationship Specialty Notifications Start End  Leamon Arnt, MD PCP - General Family Medicine  09/08/17   Gevena Barre, MD Referring Physician Cardiology  09/08/17    Objective  Vitals: BP 122/80   Pulse 64   Temp 97.9 F (36.6 C)   Ht 5\' 6"  (1.676 m)   Wt 155 lb 6.4 oz (70.5 kg)   SpO2 98%   BMI 25.08 kg/m  General:  Well developed, well nourished, no acute distress  Psych:  Alert and orientedx3,normal mood and affect HEENT:  Normocephalic, atraumatic, non-icteric sclera, PERRL, oropharynx is clear without mass or exudate, supple neck without adenopathy, mass or thyromegaly Cardiovascular:  Normal S1, S2, RRR without gallop, rub or murmur, nondisplaced PMI, +2 distal pulses in bilateral upper and lower extremities. Respiratory:  Good breath sounds bilaterally, CTAB with normal respiratory effort Gastrointestinal: normal bowel sounds, soft, non-tender, no noted masses. No HSM MSK: no deformities, contusions. Joints are without erythema or swelling. Spine and CVA region are nontender Skin:  Warm, no rashes or suspicious lesions noted, large chronic seroma back of scalp, nontender, multiple well healed scars Neurologic:    Mental status is normal. CN 2-11 are normal. Gross motor and sensory exams are normal. Stable gait. No tremor GU: No inguinal hernias or adenopathy are appreciated bilaterally   Assessment  1. Essential hypertension   2. Mixed hyperlipidemia   3. History of thyroid cancer   4. Tetralogy of Fallot s/p repair   5. Hypogonadism in male   58. Postoperative hypothyroidism   7. Gross hematuria      Plan  Male Wellness Visit:  Age appropriate Health Maintenance and Prevention measures were discussed with patient. Included topics are cancer screening recommendations, ways to keep healthy (see AVS) including dietary and exercise recommendations, regular eye and dental care, use of seat belts,  and avoidance of moderate alcohol use and tobacco use.   BMI: discussed patient's BMI and encouraged positive lifestyle modifications to help get to or maintain a target BMI.  HM needs and immunizations were addressed and ordered. See below for orders. See HM and immunization section for updates.  Routine labs and screening tests ordered including cmp, cbc and lipids where appropriate.  Discussed recommendations regarding Vit D and calcium supplementation (see AVS)  Chronic disease f/u and/or acute problem visit: (deemed necessary to be done in addition to the wellness visit):  HTN, HLD, low T and low thyroid: check all levels today. Well controlled. No change in meds.   H/o gross hematuria: resolved. Recheck urine today.   Follow up: Return in about 6 months (around 07/11/2018) for follow up Hypertension.   Commons side effects, risks, benefits, and alternatives for medications and treatment plan prescribed today were discussed, and the patient expressed understanding of the given instructions. Patient is instructed to call or message via MyChart if he/she has any questions or concerns regarding our treatment plan. No barriers to understanding were identified. We discussed Red Flag symptoms and signs in detail. Patient expressed understanding regarding what to do in case of urgent or emergency type symptoms.   Medication list was reconciled, printed and provided to the patient in AVS. Patient instructions and summary information was reviewed with the patient as documented in the AVS. This note was prepared with assistance of Dragon voice recognition software. Occasional  wrong-word or sound-a-like substitutions may have occurred due to the inherent limitations of voice recognition software  Orders Placed This Encounter  Procedures  . CBC with Differential/Platelet  . Comprehensive metabolic panel  . TSH  . Testosterone,Free and Total  . Lipid panel  . POCT urinalysis dipstick   No  orders of the defined types were placed in this encounter.

## 2018-01-10 NOTE — Patient Instructions (Addendum)
Please return in 6 months for follow up of your hypertension.  We will send you a letter with your results and call if changes need to be made.   If you have any questions or concerns, please don't hesitate to send me a message via MyChart or call the office at [phone]. Thank you for visiting with [place] today! It's our pleasure caring for you.  Please do these things to maintain good health!   Exercise at least 30-45 minutes a day,  4-5 days a week.   Eat a low-fat diet with lots of fruits and vegetables, up to 7-9 servings per day.  Drink plenty of water daily. Try to drink 8 8oz glasses per day.  Seatbelts can save your life. Always wear your seatbelt.  Place Smoke Detectors on every level of your home and check batteries every year.  Eye Doctor - have an eye exam every 1-2 years  Safe sex - use condoms to protect yourself from STDs if you could be exposed to these types of infections.  Avoid heavy alcohol use. If you drink, keep it to less than 2 drinks/day and not every day.  [place].  Choose someone you trust that could speak for you if you became unable to speak for yourself.  Depression is common in our stressful world.If you're feeling down or losing interest in things you normally enjoy, please come in for a visit.

## 2018-01-11 NOTE — Progress Notes (Signed)
Please call patient: I have reviewed his/her lab results. All labs are stable. Thyroid is now at goal. Testosterone levels are perfect. Urine is normal. Continue medications without changes and Follow up as directed.

## 2018-01-12 LAB — TESTOSTERONE,FREE AND TOTAL
TESTOSTERONE FREE: 9.3 pg/mL (ref 6.6–18.1)
Testosterone: 647 ng/dL (ref 264–916)

## 2018-02-04 ENCOUNTER — Other Ambulatory Visit: Payer: Self-pay | Admitting: Family Medicine

## 2018-02-23 DIAGNOSIS — Q213 Tetralogy of Fallot: Secondary | ICD-10-CM | POA: Diagnosis not present

## 2018-03-28 DIAGNOSIS — I493 Ventricular premature depolarization: Secondary | ICD-10-CM | POA: Diagnosis not present

## 2018-04-09 DIAGNOSIS — Q213 Tetralogy of Fallot: Secondary | ICD-10-CM | POA: Diagnosis not present

## 2018-06-05 ENCOUNTER — Telehealth: Payer: Self-pay | Admitting: Family Medicine

## 2018-06-05 NOTE — Telephone Encounter (Signed)
Please advise 

## 2018-06-05 NOTE — Telephone Encounter (Signed)
Copied from [place] [id number]. Topic: Quick Communication - See Telephone Encounter >> Jun 05, 2018 10:35 AM Conception Chancy, NT wrote: CRM for notification. See Telephone encounter for: 06/05/18.  Patient is requesting something called in for [place]. Please advise.  [id number] [place], [place] [address] [place] [address] Phone: [phone] Fax: [phone]

## 2018-06-05 NOTE — Telephone Encounter (Signed)
Pt has tried OTC with no relief. He has appt scheduled for 9am 1/29

## 2018-06-05 NOTE — Telephone Encounter (Signed)
Recommend trial of otc lamisil cream to affected area bid x 1-2 weeks. OV for evaluation if needed.

## 2018-06-06 ENCOUNTER — Encounter: Payer: Self-pay | Admitting: Family Medicine

## 2018-06-06 ENCOUNTER — Ambulatory Visit (INDEPENDENT_AMBULATORY_CARE_PROVIDER_SITE_OTHER): Payer: Medicare Other | Admitting: Family Medicine

## 2018-06-06 ENCOUNTER — Other Ambulatory Visit: Payer: Self-pay

## 2018-06-06 VITALS — BP 120/76 | HR 61 | Temp 98.2°F | Resp 16 | Ht 66.0 in | Wt 161.0 lb

## 2018-06-06 DIAGNOSIS — B356 Tinea cruris: Secondary | ICD-10-CM | POA: Diagnosis not present

## 2018-06-06 MED ORDER — KETOCONAZOLE 2 % EX CREA: 1.0000 "application " | TOPICAL_CREAM | Freq: Two times a day (BID) | CUTANEOUS | 0 refills | Status: AC

## 2018-06-06 NOTE — Progress Notes (Signed)
Subjective  CC:  Chief Complaint  Patient presents with  . Jockitch    Started a month ago, has tried OTC lotrimin with no relief    HPI: Christopher Mosley is a 73 y.o. male who presents to the office today to address the problems listed above in the chief complaint.  Has "jock itch" again. Gets in every once in awhile. Used over the counter lotrimin w/o relief. Red in bilateral groin and itching. Has always cleared with topical RX antifungals in the past. Has never required oral abx.    Assessment  1. Tinea cruris      Plan   Tinea cruris vs erythrasma:  Ketoconazole cream ordered. F/u if not improving. Consider e-mycin if not clearing.   Follow up: prn  07/11/2018  No orders of the defined types were placed in this encounter.  Meds ordered this encounter  Medications  . ketoconazole (NIZORAL) 2 % cream    Sig: Apply 1 application topically 2 (two) times daily for 7 days. Then as needed    Dispense:  30 g    Refill:  0      I reviewed the patients updated PMH, FH, and SocHx.    Patient Active Problem List   Diagnosis Date Noted  . Erectile dysfunction 10/30/2017  . History of thyroid cancer 09/08/2017  . Mixed hyperlipidemia 09/08/2017  . Essential hypertension 09/08/2017  . Colon polyps 09/08/2017  . Vocal cord dysfunction 03/29/2017  . History of Blalock-Taussig shunt 12/15/2015  . Tetralogy of Fallot s/p repair 12/15/2015  . Hypogonadism in male 08/12/2015  . Postoperative hypothyroidism 08/12/2015   Current Meds  Medication Sig  . amLODipine (NORVASC) 5 MG tablet TAKE 1 TABLET BY MOUTH EVERY DAY  . aspirin 81 MG chewable tablet Chew by mouth.  Marland Kitchen atorvastatin (LIPITOR) 40 MG tablet Take 40 mg by mouth daily.  Marland Kitchen levothyroxine (SYNTHROID, LEVOTHROID) 75 MCG tablet TAKE 1 TABLET BY MOUTH ONCE DAILY BEFORE  BREAKFAST  . testosterone cypionate (DEPOTESTOSTERONE CYPIONATE) 200 MG/ML injection Inject 0.5 mLs (100 mg total) into the muscle every 14 (fourteen)  days.    Allergies: Patient is allergic to codeine and sulfa antibiotics. Family History: Patient family history includes Alcohol abuse in his father, maternal grandfather, and mother; Arthritis in his brother; Asthma in his brother; Cancer in his paternal grandfather; Depression in his mother; Diabetes in his brother, father, and mother; Drug abuse in his brother; Early death in his father, maternal grandmother, and mother; Healthy in his daughter and son; Heart attack in his father, maternal grandfather, and maternal grandmother; Heart disease in his father; Hyperlipidemia in his mother; Hypertension in his father and mother; Miscarriages / [place] in his mother; Pancreatic cancer in his father; Renal cancer in his brother; Stomach cancer in his mother; Stroke in his brother. Social History:  Patient  reports that he has never smoked. He has never used smokeless tobacco. He reports current alcohol use. He reports that he does not use drugs.  Review of Systems: Constitutional: Negative for fever malaise or anorexia Cardiovascular: negative for chest pain Respiratory: negative for SOB or persistent cough Gastrointestinal: negative for abdominal pain  Objective  Vitals: BP 120/76   Pulse 61   Temp 98.2 F (36.8 C) (Oral)   Resp 16   Ht 5\' 6"  (1.676 m)   Wt 161 lb (73 kg)   SpO2 98%   BMI 25.99 kg/m  General: no acute distress , A&Ox3 Bilateral groin with macular erythematous rash with  clear demarcations    Commons side effects, risks, benefits, and alternatives for medications and treatment plan prescribed today were discussed, and the patient expressed understanding of the given instructions. Patient is instructed to call or message via MyChart if he/she has any questions or concerns regarding our treatment plan. No barriers to understanding were identified. We discussed Red Flag symptoms and signs in detail. Patient expressed understanding regarding what to do in case of urgent  or emergency type symptoms.   Medication list was reconciled, printed and provided to the patient in AVS. Patient instructions and summary information was reviewed with the patient as documented in the AVS. This note was prepared with assistance of Dragon voice recognition software. Occasional wrong-word or sound-a-like substitutions may have occurred due to the inherent limitations of voice recognition software

## 2018-06-06 NOTE — Patient Instructions (Addendum)
Please follow up if symptoms do not improve or as needed.    Jock Itch  Jock itch is an itchy rash in the groin and upper thigh area. It is a skin infection that is caused by a type of germ that lives in dark, damp places (fungus). The rash usually goes away in 2-3 weeks with treatment. Follow these instructions at home: Skin care  Use skin creams, ointments, or powders exactly as told by your doctor.  Wear loose-fitting clothes. Clothes should not rub against your groin area. Men should wear boxer shorts or loose-fitting underwear.  Keep your groin area clean and dry. ? Change your underwear every day. ? Change out of wet bathing suits as soon as you can. ? After bathing, use a separate towel to dry your groin area. Dry the area gently and completely.  Avoid hot baths and showers. Hot water can make itching worse.  Do not scratch the area. General instructions  Take and apply over-the-counter and prescription medicines only as told by your doctor.  Do not share towels or clothing with other people.  Wash your hands often with soap and water, especially after touching your groin area. If you do not have soap and water, use alcohol-based hand sanitizer. Contact a doctor if:  Your rash: ? Gets worse. ? Does not get better after 2 weeks of treatment. ? Spreads. ? Comes back after treatment is done.  You have any of the following: ? A fever. ? New or worsening redness, swelling, or pain around your rash. ? Fluid, blood, or pus coming from your rash. Summary  Jock itch is an itchy rash. It affects the groin and upper thigh area.  Jock itch usually goes away in 2-3 weeks with treatment.  Keep your groin area clean and dry. This information is not intended to replace advice given to you by your health care provider. Make sure you discuss any questions you have with your health care provider. Document Released: 07/20/2009 Document Revised: 04/05/2017 Document Reviewed:  04/05/2017 Elsevier Interactive Patient Education  2019 [employer].

## 2018-06-22 ENCOUNTER — Telehealth: Payer: Self-pay | Admitting: Family Medicine

## 2018-06-22 MED ORDER — CLOTRIMAZOLE 1 % EX CREA: 1.0000 "application " | TOPICAL_CREAM | Freq: Two times a day (BID) | CUTANEOUS | 0 refills | Status: DC

## 2018-06-22 MED ORDER — CLINDAMYCIN PHOSPHATE 1 % EX LOTN: TOPICAL_LOTION | Freq: Two times a day (BID) | CUTANEOUS | 0 refills | Status: DC

## 2018-06-22 MED ORDER — ERYTHROMYCIN 2 % EX GEL: Freq: Every day | CUTANEOUS | 0 refills | Status: DC

## 2018-06-22 NOTE — Telephone Encounter (Signed)
Pt states that Emgel is over $100, he is wanting to know what else could he get in the place of that gel. We will need to call pt to let him know due to having to look in his insurance book for pricing.

## 2018-06-22 NOTE — Telephone Encounter (Signed)
Copied from [place] [phone]. Topic: Quick Communication - Rx Refill/Question >> Jun 22, 2018  8:01 AM Margot Ables wrote: Medication: ketoconazole (NIZORAL) 2 % cream - pt called stating this is not working - please call him to let him know of alternatives so he can call his insurance to make sure whatever gets ordered will be covered.  Preferred Pharmacy (with phone number or street name): [id number] [place], [place] [phone] (Phone) [phone] (Fax)

## 2018-06-22 NOTE — Telephone Encounter (Signed)
Changed to topical clindamycin solution.  Could also use gel if that is less expensive.

## 2018-06-22 NOTE — Telephone Encounter (Signed)
Please see below and advise.

## 2018-06-22 NOTE — Telephone Encounter (Signed)
Ok. This could be a fungal infection (tinea) but it could also be due to a bacterial skin infection called erythrasma as we discussed at your visit.   I have ordered erythromycin gel to be used daily for the next week.  Also, I have ordered a different antifungal.  Let me recheck in the office if these things don't help.

## 2018-06-22 NOTE — Telephone Encounter (Signed)
Pt has a discount coupon he is going to use and will update [place] if he needs any further assistance.

## 2018-07-11 ENCOUNTER — Other Ambulatory Visit: Payer: Self-pay

## 2018-07-11 ENCOUNTER — Ambulatory Visit (INDEPENDENT_AMBULATORY_CARE_PROVIDER_SITE_OTHER): Payer: Medicare Other | Admitting: Family Medicine

## 2018-07-11 ENCOUNTER — Encounter: Payer: Self-pay | Admitting: Family Medicine

## 2018-07-11 VITALS — BP 122/60 | HR 60 | Temp 97.8°F | Resp 16 | Ht 66.0 in | Wt 163.8 lb

## 2018-07-11 DIAGNOSIS — E291 Testicular hypofunction: Secondary | ICD-10-CM

## 2018-07-11 DIAGNOSIS — E890 Postprocedural hypothyroidism: Secondary | ICD-10-CM

## 2018-07-11 DIAGNOSIS — E782 Mixed hyperlipidemia: Secondary | ICD-10-CM | POA: Diagnosis not present

## 2018-07-11 DIAGNOSIS — I10 Essential (primary) hypertension: Secondary | ICD-10-CM | POA: Diagnosis not present

## 2018-07-11 DIAGNOSIS — K635 Polyp of colon: Secondary | ICD-10-CM | POA: Diagnosis not present

## 2018-07-11 DIAGNOSIS — Z85850 Personal history of malignant neoplasm of thyroid: Secondary | ICD-10-CM

## 2018-07-11 DIAGNOSIS — L081 Erythrasma: Secondary | ICD-10-CM

## 2018-07-11 LAB — COMPREHENSIVE METABOLIC PANEL
ALBUMIN: 4.4 g/dL (ref 3.5–5.2)
ALT: 18 U/L (ref 0–53)
AST: 19 U/L (ref 0–37)
Alkaline Phosphatase: 77 U/L (ref 39–117)
BUN: 17 mg/dL (ref 6–23)
CALCIUM: 9.4 mg/dL (ref 8.4–10.5)
CHLORIDE: 100 meq/L (ref 96–112)
CO2: 30 meq/L (ref 19–32)
Creatinine, Ser: 1.2 mg/dL (ref 0.40–1.50)
GFR: 59.73 mL/min — ABNORMAL LOW (ref >60.00)
Glucose, Bld: 97 mg/dL (ref 70–99)
Potassium: 5 mEq/L (ref 3.5–5.1)
SODIUM: 139 meq/L (ref 135–145)
Total Bilirubin: 1.1 mg/dL (ref 0.2–1.2)
Total Protein: 6.3 g/dL (ref 6.0–8.3)

## 2018-07-11 LAB — TSH: TSH: 1.9 u[IU]/mL (ref 0.35–4.50)

## 2018-07-11 MED ORDER — PNEUMOCOCCAL 13-VAL CONJ VACC IM SUSP: 0.5000 mL | Freq: Once | INTRAMUSCULAR | 0 refills | Status: AC

## 2018-07-11 MED ORDER — ZOSTER VAC RECOMB ADJUVANTED 50 MCG/0.5ML IM SUSR: 0.5000 mL | Freq: Once | INTRAMUSCULAR | 0 refills | Status: AC

## 2018-07-11 NOTE — Patient Instructions (Signed)
Please return in 6 months for your annual complete physical; please come fasting.  Please consider signing up for my chart so you may follow your labs on the portal with me.  If you sign up, I will release them to you. If not, I will send you a letter.   If you have any questions or concerns, please don't hesitate to send me a message via MyChart or call the office at [phone]. Thank you for visiting with Christopher Mosley today! It's our pleasure caring for you.  Please go the pharmacy to get your prevnar and shingrix vaccinations. I have given you printed RXs  We will call you with information regarding your referral appointment. Dr. Lynne Leader office for colonoscopy and Dermatology to evaluate your rash.  If you do not hear from Christopher Mosley within the next 2 weeks, please let me know. It can take 1-2 weeks to get appointments set up with the specialists.

## 2018-07-11 NOTE — Progress Notes (Signed)
Subjective  CC:  Chief Complaint  Patient presents with  . Hypertension  . Annual Exam    Fasting    HPI: Christopher Mosley is a 73 y.o. male who presents to the office today to address the problems listed above in the chief complaint.  Hypertension f/u: Control is good . Pt reports he is doing well. taking medications as instructed, no medication side effects noted, no TIAs, no chest pain on exertion, no dyspnea on exertion, no swelling of ankles. He denies adverse effects from his BP medications. Compliance with medication is good.   HLD on statin and doing well. Last labs in September.   Rash - groing, treated with antifungal and antibacterial cream. Improved but still present.   Low T - doing well on IM supplementation.   Low thyroid: feels great. Energy level is good. Takes meds daily.   HM: due colonoscopy to f/u on colon polyps. Needs referral. Imms: shingrix and prevnar due.   Assessment  1. Essential hypertension   2. Mixed hyperlipidemia   3. History of thyroid cancer   4. Postoperative hypothyroidism   5. Hypogonadism in male   21. Polyp of colon, unspecified part of colon, unspecified type   7. Erythrasma      Plan    Hypertension f/u: BP control is well controlled. Continue meds.  Hyperlipidemia f/u: at goal.   Recheck TSh  Recheck T  Refer to derm for rash; refer to GI for colonoscopy.  Pt refuses AWV.   Education regarding management of these chronic disease states was given. Management strategies discussed on successive visits include dietary and exercise recommendations, goals of achieving and maintaining IBW, and lifestyle modifications aiming for adequate sleep and minimizing stressors.   Follow up: Return in about 6 months (around 01/11/2019) for follow up Hypertension.  Orders Placed This Encounter  Procedures  . Comprehensive metabolic panel  . TSH  . Testosterone,Free and Total  . Ambulatory referral to Gastroenterology  .  Ambulatory referral to Dermatology   Meds ordered this encounter  Medications  . pneumococcal 13-valent conjugate vaccine (PREVNAR 13) SUSP injection    Sig: Inject 0.5 mLs into the muscle once for 1 dose.    Dispense:  0.5 mL    Refill:  0  . Zoster Vaccine Adjuvanted [facility]) injection    Sig: Inject 0.5 mLs into the muscle once for 1 dose. Please give 2nd dose 2-6 months after first dose    Dispense:  2 each    Refill:  0      BP Readings from Last 3 Encounters:  07/11/18 122/60  06/06/18 120/76  01/10/18 122/80   Wt Readings from Last 3 Encounters:  07/11/18 163 lb 12.8 oz (74.3 kg)  06/06/18 161 lb (73 kg)  01/10/18 155 lb 6.4 oz (70.5 kg)    Lab Results  Component Value Date   CHOL 136 01/10/2018   Lab Results  Component Value Date   HDL 32.00 (L) 01/10/2018   Lab Results  Component Value Date   LDLCALC 91 01/10/2018   Lab Results  Component Value Date   TRIG 67.0 01/10/2018   Lab Results  Component Value Date   CHOLHDL 4 01/10/2018   No results found for: LDLDIRECT Lab Results  Component Value Date   CREATININE 1.26 01/10/2018   BUN 18 01/10/2018   NA 140 01/10/2018   K 4.8 01/10/2018   CL 101 01/10/2018   CO2 33 (H) 01/10/2018    The 10-year ASCVD  risk score Christopher Mosley., et al., 2013) is: 19.4%   Values used to calculate the score:     Age: 55 years     Sex: Male     Is Non-Hispanic African American: No     Diabetic: No     Tobacco smoker: No     Systolic Blood Pressure: 989 mmHg     Is BP treated: Yes     HDL Cholesterol: 32 mg/dL     Total Cholesterol: 136 mg/dL  I reviewed the patients updated PMH, FH, and SocHx.    Patient Active Problem List   Diagnosis Date Noted  . Erectile dysfunction 10/30/2017  . History of thyroid cancer 09/08/2017  . Mixed hyperlipidemia 09/08/2017  . Essential hypertension 09/08/2017  . Colon polyps 09/08/2017  . Vocal cord dysfunction 03/29/2017  . History of Blalock-Taussig shunt 12/15/2015  .  Tetralogy of Fallot s/p repair 12/15/2015  . Hypogonadism in male 08/12/2015  . Postoperative hypothyroidism 08/12/2015    Allergies: Codeine and Sulfa antibiotics  Social History: Patient  reports that he has never smoked. He has never used smokeless tobacco. He reports current alcohol use. He reports that he does not use drugs.  Current Meds  Medication Sig  . amLODipine (NORVASC) 5 MG tablet TAKE 1 TABLET BY MOUTH EVERY DAY  . aspirin 81 MG chewable tablet Chew by mouth.  Marland Kitchen atorvastatin (LIPITOR) 40 MG tablet Take 40 mg by mouth daily.  Marland Kitchen levothyroxine (SYNTHROID, LEVOTHROID) 75 MCG tablet TAKE 1 TABLET BY MOUTH ONCE DAILY BEFORE  BREAKFAST  . testosterone cypionate (DEPOTESTOSTERONE CYPIONATE) 200 MG/ML injection Inject 0.5 mLs (100 mg total) into the muscle every 14 (fourteen) days.    Review of Systems: Cardiovascular: negative for chest pain, palpitations, leg swelling, orthopnea Respiratory: negative for SOB, wheezing or persistent cough Gastrointestinal: negative for abdominal pain Genitourinary: negative for dysuria or gross hematuria  Objective  Vitals: BP 122/60   Pulse 60   Temp 97.8 F (36.6 C) (Oral)   Resp 16   Ht 5\' 6"  (1.676 m)   Wt 163 lb 12.8 oz (74.3 kg)   SpO2 98%   BMI 26.44 kg/m  General: no acute distress  Psych:  Alert and oriented, normal mood and affect HEENT:  Normocephalic, atraumatic, supple neck  Cardiovascular:  RRR without murmur. no edema Respiratory:  Good breath sounds bilaterally, CTAB with normal respiratory effort Skin:  Warm, bilateral groin: well demarcated erythematous rash w/o flaking Neurologic:   Mental status is normal, no tremor  Commons side effects, risks, benefits, and alternatives for medications and treatment plan prescribed today were discussed, and the patient expressed understanding of the given instructions. Patient is instructed to call or message via MyChart if he/she has any questions or concerns regarding our  treatment plan. No barriers to understanding were identified. We discussed Red Flag symptoms and signs in detail. Patient expressed understanding regarding what to do in case of urgent or emergency type symptoms.   Medication list was reconciled, printed and provided to the patient in AVS. Patient instructions and summary information was reviewed with the patient as documented in the AVS. This note was prepared with assistance of Dragon voice recognition software. Occasional wrong-word or sound-a-like substitutions may have occurred due to the inherent limitations of voice recognition software

## 2018-07-12 ENCOUNTER — Encounter: Payer: Self-pay | Admitting: Family Medicine

## 2018-07-12 NOTE — Progress Notes (Signed)
I have reviewed results. Normal. Patient notified by letter. Please see letter for details. 

## 2018-07-12 NOTE — Progress Notes (Signed)
Lab results mailed to patient in letter. Normal results. No action / follow up needed on these results.

## 2018-07-13 LAB — TESTOSTERONE,FREE AND TOTAL
TESTOSTERONE FREE: 10.9 pg/mL (ref 6.6–18.1)
Testosterone: 561 ng/dL (ref 264–916)

## 2018-08-09 ENCOUNTER — Other Ambulatory Visit: Payer: Self-pay | Admitting: *Deleted

## 2018-08-09 MED ORDER — TESTOSTERONE CYPIONATE 200 MG/ML IM SOLN: 100.0000 mg | INTRAMUSCULAR | 5 refills | Status: DC

## 2018-08-13 ENCOUNTER — Telehealth: Payer: Self-pay | Admitting: *Deleted

## 2018-08-13 NOTE — Telephone Encounter (Signed)
Testosterone Cypionate approved by insurance.  Approved 05/15/2018 - 08/13/2019

## 2018-09-11 ENCOUNTER — Telehealth: Payer: Self-pay | Admitting: Family Medicine

## 2018-09-11 NOTE — Telephone Encounter (Signed)
Pt has medicare: we did his chronic medical follow up and "physical" but he will be billed a copay for the office visit since medicare does not pay for "complete physicals".   This was not his annual wellness visit which is the free talk visit.

## 2018-09-11 NOTE — Telephone Encounter (Signed)
Pt received a bill for the Office visit on 3.4.2020 which was suppose to be his annual physical . Pt stated that billing advised him of a note that he refused wellcare portion but the Pt stated he just didn't think he needed the portion discussing mental health. Pt was told by billing to call the office.  Dr. Jonni Sanger, please advise if you can make changes so we can email charge corrections to resubmit the claim with changes for coverage.

## 2018-09-12 NOTE — Telephone Encounter (Signed)
Called and left patient a voicemail with the information below and provided billing number and patient financial assistance for additional questions. No routing necessary, nothing further needed.

## 2018-10-19 ENCOUNTER — Other Ambulatory Visit: Payer: Self-pay | Admitting: *Deleted

## 2018-10-19 ENCOUNTER — Telehealth: Payer: Self-pay | Admitting: Family Medicine

## 2018-10-19 MED ORDER — TESTOSTERONE CYPIONATE 200 MG/ML IM SOLN: 100.0000 mg | INTRAMUSCULAR | 5 refills | Status: DC

## 2018-10-19 NOTE — Telephone Encounter (Signed)
Pt LMOVM asking for a refill on testosterone w/3 refills to [facility] on [email or web address]

## 2018-10-19 NOTE — Telephone Encounter (Signed)
Pt aware RX sent to pharmacy.

## 2018-10-19 NOTE — Telephone Encounter (Signed)
RX pending Dr. Jonni Sanger signature

## 2018-11-05 ENCOUNTER — Encounter: Payer: Self-pay | Admitting: Internal Medicine

## 2018-11-06 NOTE — Telephone Encounter (Signed)
Left message to call back to schedule colon. Dr. Carlean Purl reviewed records and indicated that pt is ok for direct colon. Records are in blue folder.

## 2019-01-16 ENCOUNTER — Encounter: Payer: Medicare Other | Admitting: Family Medicine

## 2019-01-30 ENCOUNTER — Other Ambulatory Visit: Payer: Self-pay | Admitting: *Deleted

## 2019-01-30 MED ORDER — LEVOTHYROXINE SODIUM 75 MCG PO TABS
ORAL_TABLET | ORAL | 3 refills | Status: DC
Start: 2019-01-30 — End: 2020-01-09

## 2019-02-14 ENCOUNTER — Encounter: Payer: Self-pay | Admitting: Family Medicine

## 2019-02-14 ENCOUNTER — Other Ambulatory Visit: Payer: Self-pay

## 2019-02-14 ENCOUNTER — Ambulatory Visit (INDEPENDENT_AMBULATORY_CARE_PROVIDER_SITE_OTHER): Payer: Medicare Other

## 2019-02-14 DIAGNOSIS — Z23 Encounter for immunization: Secondary | ICD-10-CM

## 2019-03-05 ENCOUNTER — Ambulatory Visit (INDEPENDENT_AMBULATORY_CARE_PROVIDER_SITE_OTHER): Payer: Medicare Other | Admitting: Family Medicine

## 2019-03-05 ENCOUNTER — Encounter: Payer: Self-pay | Admitting: Family Medicine

## 2019-03-05 ENCOUNTER — Telehealth: Payer: Self-pay | Admitting: *Deleted

## 2019-03-05 ENCOUNTER — Other Ambulatory Visit: Payer: Self-pay

## 2019-03-05 VITALS — BP 134/88 | HR 65 | Temp 98.4°F | Resp 16 | Ht 66.0 in | Wt 159.8 lb

## 2019-03-05 DIAGNOSIS — I10 Essential (primary) hypertension: Secondary | ICD-10-CM

## 2019-03-05 DIAGNOSIS — K635 Polyp of colon: Secondary | ICD-10-CM | POA: Diagnosis not present

## 2019-03-05 DIAGNOSIS — Z85850 Personal history of malignant neoplasm of thyroid: Secondary | ICD-10-CM

## 2019-03-05 DIAGNOSIS — M65331 Trigger finger, right middle finger: Secondary | ICD-10-CM

## 2019-03-05 DIAGNOSIS — E291 Testicular hypofunction: Secondary | ICD-10-CM | POA: Diagnosis not present

## 2019-03-05 DIAGNOSIS — E782 Mixed hyperlipidemia: Secondary | ICD-10-CM | POA: Diagnosis not present

## 2019-03-05 DIAGNOSIS — E890 Postprocedural hypothyroidism: Secondary | ICD-10-CM | POA: Diagnosis not present

## 2019-03-05 LAB — COMPREHENSIVE METABOLIC PANEL
ALT: 16 U/L (ref 0–53)
AST: 23 U/L (ref 0–37)
Albumin: 5.1 g/dL (ref 3.5–5.2)
Alkaline Phosphatase: 72 U/L (ref 39–117)
BUN: 15 mg/dL (ref 6–23)
CO2: 33 mEq/L — ABNORMAL HIGH (ref 19–32)
Calcium: 10 mg/dL (ref 8.4–10.5)
Chloride: 98 mEq/L (ref 96–112)
Creatinine, Ser: 1.15 mg/dL (ref 0.40–1.50)
GFR: 62.62 mL/min (ref >60.00)
Glucose, Bld: 101 mg/dL — ABNORMAL HIGH (ref 70–99)
Potassium: 4.8 mEq/L (ref 3.5–5.1)
Sodium: 138 mEq/L (ref 135–145)
Total Bilirubin: 1.3 mg/dL — ABNORMAL HIGH (ref 0.2–1.2)
Total Protein: 7.1 g/dL (ref 6.0–8.3)

## 2019-03-05 LAB — LIPID PANEL
Cholesterol: 170 mg/dL (ref 0–200)
HDL: 36.5 mg/dL — ABNORMAL LOW (ref >39.00)
LDL Cholesterol: 115 mg/dL — ABNORMAL HIGH (ref 0–99)
NonHDL: 133.51
Total CHOL/HDL Ratio: 5
Triglycerides: 95 mg/dL (ref 0.0–149.0)
VLDL: 19 mg/dL (ref 0.0–40.0)

## 2019-03-05 LAB — CBC WITH DIFFERENTIAL/PLATELET
Basophils Absolute: 0 10*3/uL (ref 0.0–0.1)
Basophils Relative: 0.5 % (ref 0.0–3.0)
Eosinophils Absolute: 0.2 10*3/uL (ref 0.0–0.7)
Eosinophils Relative: 2.3 % (ref 0.0–5.0)
HCT: 56 % — ABNORMAL HIGH (ref 39.0–52.0)
Hemoglobin: 18.5 g/dL (ref 13.0–17.0)
Lymphocytes Relative: 39.6 % (ref 12.0–46.0)
Lymphs Abs: 3.2 10*3/uL (ref 0.7–4.0)
MCHC: 33.1 g/dL (ref 30.0–36.0)
MCV: 91.4 fl (ref 78.0–100.0)
Monocytes Absolute: 0.9 10*3/uL (ref 0.1–1.0)
Monocytes Relative: 10.9 % (ref 3.0–12.0)
Neutro Abs: 3.8 10*3/uL (ref 1.4–7.7)
Neutrophils Relative %: 46.7 % (ref 43.0–77.0)
Platelets: 258 10*3/uL (ref 150.0–400.0)
RBC: 6.12 Mil/uL — ABNORMAL HIGH (ref 4.22–5.81)
RDW: 13.8 % (ref 11.5–15.5)
WBC: 8.1 10*3/uL (ref 4.0–10.5)

## 2019-03-05 MED ORDER — TRIAMCINOLONE ACETONIDE 40 MG/ML IJ SUSP
10.0000 mg | Freq: Once | INTRAMUSCULAR | Status: AC
  Administered 2019-03-05: 10 mg via INTRAMUSCULAR

## 2019-03-05 NOTE — Progress Notes (Signed)
Subjective  Chief Complaint  Patient presents with   Annual Exam    Fasting   Hypertension    Home BPs have been within normal range   Hypothyroidism    HPI: Christopher Mosley is a 73 y.o. male who presents to [facility] today for a Male Wellness Visit. He also has the concerns and/or needs as listed above in the chief complaint. These will be addressed in addition to the Health Maintenance Visit.   Wellness Visit: annual visit with health maintenance review and exam    HM: lives very healthy lifestyle. Walking 4 miles/day. Feels well. imms up to date. Due colonoscopy but deferring due to covid. No sxs of melena, weight loss, abdominal pain; h/o colon polyp.  HTN: well controlled on amlodipine. Feeling well. Taking medications w/o adverse effects. No symptoms of CHF, angina; no palpitations, sob, cp or lower extremity edema. Compliant with meds.   Low thyroid, post operative: compliant with meds. Energy levels are good. No sxs of high or low thyroid.   Low T on supplements; due for fasting recheck. Takes IM biweekly.   HLD: on statin and well tolerated.   Cardioloy: h/o tetralogy of fallot s/p repair. Stable clinically.   C/o trigger finger, middle finger right hand. Had injection about 3 years ago for same. Some pain with daily locking now.   Lifestyle: Body mass index is 25.79 kg/m. Wt Readings from Last 3 Encounters:  03/05/19 159 lb 12.8 oz (72.5 kg)  07/11/18 163 lb 12.8 oz (74.3 kg)  06/06/18 161 lb (73 kg)   BP Readings from Last 3 Encounters:  03/05/19 134/88  07/11/18 122/60  06/06/18 120/76    Diet: low fat Exercise: daily, walking   Patient Active Problem List   Diagnosis Date Noted   Erectile dysfunction 10/30/2017   History of thyroid cancer 09/08/2017   Mixed hyperlipidemia 09/08/2017   Essential hypertension 09/08/2017   Colon polyps 09/08/2017   Vocal cord dysfunction 03/29/2017   History of  Blalock-Taussig shunt 12/15/2015   Tetralogy of Fallot s/p repair 12/15/2015   Hypogonadism in male 08/12/2015   Postoperative hypothyroidism 08/12/2015   Health Maintenance  Topic Date Due   COLONOSCOPY  03/04/2020 (Originally 07/11/2018)   TETANUS/TDAP  12/14/2025   Hepatitis C Screening  Completed   INFLUENZA VACCINE  Discontinued   PNA vac Low Risk Adult  Discontinued   Immunization History  Administered Date(s) Administered   Fluad Quad(high Dose 65+) 02/14/2019   Pneumococcal Conjugate-13 09/11/2017   Pneumococcal Polysaccharide-23 05/09/2008, 05/09/2013   Pneumococcal-Unspecified 09/11/2017   Tdap 12/15/2015   Zoster 05/09/2013   We updated and reviewed the patient's past history in detail and it is documented below. Allergies: Patient is allergic to codeine and sulfa antibiotics. 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 Past Surgical History Patient  has a past surgical history that includes Blalock procedure [id number]); Tetralogy of Fallot repair [phone]); Pottts shunt (1957); Melanoma excision; Total knee arthroplasty (Left, 2016); Total thyroidectomy (2002); Bladder surgery (2016); Pulmonary valve replacement (10/21/2016); and Colonoscopy. Social History Patient  reports that he has never smoked. He has never used smokeless tobacco. He reports current alcohol use. He reports that he does not use drugs. Family History family history includes Alcohol abuse in his father, maternal grandfather, and mother; Arthritis in his brother;  Asthma in his brother; Cancer in his paternal grandfather; Depression in his mother; Diabetes in his brother, father, and mother; Drug abuse in his brother; Early death in his father, maternal grandmother, and mother; Healthy in his  daughter and son; Heart attack in his father, maternal grandfather, and maternal grandmother; Heart disease in his father; Hyperlipidemia in his mother; Hypertension in his father and mother; Miscarriages / [place] in his mother; Pancreatic cancer in his father; Renal cancer in his brother; Stomach cancer in his mother; Stroke in his brother. Review of Systems: Constitutional: negative for fever or malaise Ophthalmic: negative for photophobia, double vision or loss of vision Cardiovascular: negative for chest pain, dyspnea on exertion, or new LE swelling Respiratory: negative for SOB or persistent cough Gastrointestinal: negative for abdominal pain, change in bowel habits or melena Genitourinary: negative for dysuria or gross hematuria Musculoskeletal: negative for new gait disturbance or muscular weakness Integumentary: negative for new or persistent rashes Neurological: negative for TIA or stroke symptoms Psychiatric: negative for SI or delusions Allergic/Immunologic: negative for hives  Patient Care Team    Relationship Specialty Notifications Start End  Leamon Arnt, MD PCP - General Family Medicine  09/08/17   Gevena Barre, MD Referring Physician Cardiology  09/08/17    Objective  Vitals: BP 134/88    Pulse 65    Temp 98.4 F (36.9 C) (Tympanic)    Resp 16    Ht 5\' 6"  (1.676 m)    Wt 159 lb 12.8 oz (72.5 kg)    SpO2 98%    BMI 25.79 kg/m  General:  Well developed, well nourished, no acute distress  Psych:  Alert and orientedx3,normal mood and affect HEENT:  Normocephalic, atraumatic, non-icteric sclera, PERRL, oropharynx is clear without mass or exudate, supple neck without adenopathy, mass or thyromegaly Cardiovascular:  Normal S1, S2, RRR without gallop, rub or murmur, nondisplaced PMI, +2 distal pulses in bilateral upper and lower extremities. Respiratory:  Good breath sounds bilaterally, CTAB with normal respiratory effort Gastrointestinal: normal bowel sounds, soft,  non-tender, no noted masses. No HSM MSK: no deformities, contusions. Joints are without erythema or swelling. Spine and CVA region are nontender, right hand with triggering of middle finger. nontender nodule palpated at mcp.  Skin:  Warm, no rashes or suspicious lesions noted Neurologic:    Mental status is normal. CN 2-11 are normal. Gross motor and sensory exams are normal. Stable gait. No tremor GU: No inguinal hernias or adenopathy are appreciated bilaterally   Trigger Finger Steroid Injection Procedure Note  Indications: The patient has symptoms of trigger digit that causes pain and locking.   Pre-operative Diagnosis: right 3rd Trigger Finger  Post-operative Diagnosis: same   Procedure Details  A Time Out was held and the above information confirmed. The skin was prepped with alcohol and cold spray was use for anesthesia. The symptomatic trigger finger/nodule was injected with 0.71ml of Kenalog 40mg /ml and 0.23ml of 1% lidocaine. No complications.    Assessment  1. Essential hypertension   2. Mixed hyperlipidemia   3. Postoperative hypothyroidism   4. Hypogonadism in male   5. History of thyroid cancer   6. Polyp of colon, unspecified part of colon, unspecified type   7. Trigger middle finger of right hand      Plan  Male Wellness Visit:  Age appropriate Health Maintenance and Prevention measures were discussed with patient. Included topics are cancer screening recommendations, ways to keep healthy (see AVS) including dietary and exercise recommendations, regular eye and  dental care, use of seat belts, and avoidance of moderate alcohol use and tobacco use. To schedule colonoscopy within the next 6 months.   BMI: discussed patient's BMI and encouraged positive lifestyle modifications to help get to or maintain a target BMI.  HM needs and immunizations were addressed and ordered. See below for orders. See HM and immunization section for updates.  Routine labs and screening  tests ordered including cmp, cbc and lipids where appropriate.  Discussed recommendations regarding Vit D and calcium supplementation (see AVS)  Chronic disease f/u and/or acute problem visit: (deemed necessary to be done in addition to the wellness visit):  HTN is controlled. Check renal function and electrolytes.  HLD checkfasting lipids and lfts today  Recheck am testosterone on supplements.   Low thyroid: has been well controlled. Euthyroid clinically. Recheck today.   Trigger finger: Routine post procedure care instructions were given to patient in detail.  Due for AWV.   Follow up: Return in about 6 months (around 09/03/2019) for follow up Hypertension. AWV at convenience of pt.  Commons side effects, risks, benefits, and alternatives for medications and treatment plan prescribed today were discussed, and the patient expressed understanding of the given instructions. Patient is instructed to call or message via MyChart if he/she has any questions or concerns regarding our treatment plan. No barriers to understanding were identified. We discussed Red Flag symptoms and signs in detail. Patient expressed understanding regarding what to do in case of urgent or emergency type symptoms.   Medication list was reconciled, printed and provided to the patient in AVS. Patient instructions and summary information was reviewed with the patient as documented in the AVS. This note was prepared with assistance of Dragon voice recognition software. Occasional wrong-word or sound-a-like substitutions may have occurred due to the inherent limitations of voice recognition software  Orders Placed This Encounter  Procedures   CBC with Differential/Platelet   Comprehensive metabolic panel   Lipid panel   TSH   Testos,Total,Free and SHBG (Male)   Meds ordered this encounter  Medications   triamcinolone acetonide (KENALOG-40) injection 10 mg

## 2019-03-05 NOTE — Addendum Note (Signed)
Addended by: Francis Dowse T on: 03/05/2019 11:18 AM   Modules accepted: Orders

## 2019-03-05 NOTE — Addendum Note (Signed)
Addended by: Francis Dowse T on: 03/05/2019 11:16 AM   Modules accepted: Orders

## 2019-03-05 NOTE — Addendum Note (Signed)
Addended by: Billey Chang on: 03/05/2019 11:30 AM   Modules accepted: Orders

## 2019-03-05 NOTE — Telephone Encounter (Signed)
High normal value on last hemoglobin-appears now on testosterone therapy-would recommend discontinuing testosterone until speaking with Dr. Jonni Sanger

## 2019-03-05 NOTE — Addendum Note (Signed)
Addended by: Billey Chang on: 03/05/2019 11:38 AM   Modules accepted: Orders

## 2019-03-05 NOTE — Addendum Note (Signed)
Addended by: Francis Dowse T on: 03/05/2019 11:19 AM   Modules accepted: Orders

## 2019-03-05 NOTE — Patient Instructions (Signed)
Please return in 6 months for follow up of your hypertension.  Medicare recommends an Annual Wellness Visit for all patients. Please schedule this to be done with our Nurse Educator, Loma Sousa. This is an informative "talk" visit; it's goals are to ensure that your health care needs are being met and to give you education regarding avoiding falls, ensuring you are not suffering from depression or problems with memory or thinking, and to educate you on Advance Care Planning. It helps me take good care of you!   If you have any questions or concerns, please don't hesitate to send me a message via MyChart or call the office at [phone]. Thank you for visiting with Christopher Mosley today! It's our pleasure caring for you.   Preventive Care 71 Years and Older, Male Preventive care refers to lifestyle choices and visits with your health care provider that can promote health and wellness. This includes:  A yearly physical exam. This is also called an annual well check.  Regular dental and eye exams.  Immunizations.  Screening for certain conditions.  Healthy lifestyle choices, such as diet and exercise. What can I expect for my preventive care visit? Physical exam Your health care provider will check:  Height and weight. These may be used to calculate body mass index (BMI), which is a measurement that tells if you are at a healthy weight.  Heart rate and blood pressure.  Your skin for abnormal spots. Counseling Your health care provider may ask you questions about:  Alcohol, tobacco, and drug use.  Emotional well-being.  Home and relationship well-being.  Sexual activity.  Eating habits.  History of falls.  Memory and ability to understand (cognition).  Work and work Statistician. What immunizations do I need?  Influenza (flu) vaccine  This is recommended every year. Tetanus, diphtheria, and pertussis (Tdap) vaccine  You may need a Td booster every 10 years. Varicella (chickenpox)  vaccine  You may need this vaccine if you have not already been vaccinated. Zoster (shingles) vaccine  You may need this after age 75. Pneumococcal conjugate (PCV13) vaccine  One dose is recommended after age 9. Pneumococcal polysaccharide (PPSV23) vaccine  One dose is recommended after age 15. Measles, mumps, and rubella (MMR) vaccine  You may need at least one dose of MMR if you were born in 1957 or later. You may also need a second dose. Meningococcal conjugate (MenACWY) vaccine  You may need this if you have certain conditions. Hepatitis A vaccine  You may need this if you have certain conditions or if you travel or work in places where you may be exposed to hepatitis A. Hepatitis B vaccine  You may need this if you have certain conditions or if you travel or work in places where you may be exposed to hepatitis B. Haemophilus influenzae type b (Hib) vaccine  You may need this if you have certain conditions. You may receive vaccines as individual doses or as more than one vaccine together in one shot (combination vaccines). Talk with your health care provider about the risks and benefits of combination vaccines. What tests do I need? Blood tests  Lipid and cholesterol levels. These may be checked every 5 years, or more frequently depending on your overall health.  Hepatitis C test.  Hepatitis B test. Screening  Lung cancer screening. You may have this screening every year starting at age 24 if you have a 30-pack-year history of smoking and currently smoke or have quit within the past 15 years.  Colorectal  cancer screening. All adults should have this screening starting at age 26 and continuing until age 59. Your health care provider may recommend screening at age 71 if you are at increased risk. You will have tests every 1-10 years, depending on your results and the type of screening test.  Prostate cancer screening. Recommendations will vary depending on your family  history and other risks.  Diabetes screening. This is done by checking your blood sugar (glucose) after you have not eaten for a while (fasting). You may have this done every 1-3 years.  Abdominal aortic aneurysm (AAA) screening. You may need this if you are a current or former smoker.  Sexually transmitted disease (STD) testing. Follow these instructions at home: Eating and drinking  Eat a diet that includes fresh fruits and vegetables, whole grains, lean protein, and low-fat dairy products. Limit your intake of foods with high amounts of sugar, saturated fats, and salt.  Take vitamin and mineral supplements as recommended by your health care provider.  Do not drink alcohol if your health care provider tells you not to drink.  If you drink alcohol: ? Limit how much you have to 0-2 drinks a day. ? Be aware of how much alcohol is in your drink. In the [place] one drink equals one 12 oz bottle of beer (355 mL), one 5 oz glass of wine (148 mL), or one 1 oz glass of hard liquor (44 mL). Lifestyle  Take daily care of your teeth and gums.  Stay active. Exercise for at least 30 minutes on 5 or more days each week.  Do not use any products that contain nicotine or tobacco, such as cigarettes, e-cigarettes, and chewing tobacco. If you need help quitting, ask your health care provider.  If you are sexually active, practice safe sex. Use a condom or other form of protection to prevent STIs (sexually transmitted infections).  Talk with your health care provider about taking a low-dose aspirin or statin. What's next?  Visit your health care provider once a year for a well check visit.  Ask your health care provider how often you should have your eyes and teeth checked.  Stay up to date on all vaccines. This information is not intended to replace advice given to you by your health care provider. Make sure you discuss any questions you have with your health care provider. Document Released:  05/22/2015 Document Revised: 04/19/2018 Document Reviewed: 04/19/2018 Elsevier Patient Education  [place].   Trigger Finger  Trigger finger (stenosing tenosynovitis) is a condition that causes a finger to get stuck in a bent position. Each finger has a tough, cord-like tissue that connects muscle to bone (tendon), and each tendon is surrounded by a tunnel of tissue (tendon sheath). To move your finger, your tendon needs to slide freely through the sheath. Trigger finger happens when the tendon or the sheath thickens, making it difficult to move your finger. Trigger finger can affect any finger or a thumb. It may affect more than one finger. Mild cases may clear up with rest and medicine. Severe cases require more treatment. What are the causes? Trigger finger is caused by a thickened finger tendon or tendon sheath. The cause of this thickening is not known. What increases the risk? The following factors may make you more likely to develop this condition:  Doing activities that require a strong grip.  Having rheumatoid arthritis, gout, or diabetes.  Being 19-51 years old.  Being a woman. What are the signs or  symptoms? Symptoms of this condition include:  Pain when bending or straightening your finger.  Tenderness or swelling where your finger attaches to the palm of your hand.  A lump in the palm of your hand or on the inside of your finger.  Hearing a popping sound when you try to straighten your finger.  Feeling a popping, catching, or locking sensation when you try to straighten your finger.  Being unable to straighten your finger. How is this diagnosed? This condition is diagnosed based on your symptoms and a physical exam. How is this treated? This condition may be treated by:  Resting your finger and avoiding activities that make symptoms worse.  Wearing a finger splint to keep your finger in a slightly bent position.  Taking NSAIDs to relieve pain and  swelling.  Injecting medicine (steroids) into the tendon sheath to reduce swelling and irritation. Injections may need to be repeated.  Having surgery to open the tendon sheath. This may be done if other treatments do not work and you cannot straighten your finger. You may need physical therapy after surgery. Follow these instructions at home:   Use moist heat to help reduce pain and swelling as told by your health care provider.  Rest your finger and avoid activities that make pain worse. Return to normal activities as told by your health care provider.  If you have a splint, wear it as told by your health care provider.  Take over-the-counter and prescription medicines only as told by your health care provider.  Keep all follow-up visits as told by your health care provider. This is important. Contact a health care provider if:  Your symptoms are not improving with home care. Summary  Trigger finger (stenosing tenosynovitis) causes your finger to get stuck in a bent position, and it can make it difficult and painful to straighten your finger.  This condition develops when a finger tendon or tendon sheath thickens.  Treatment starts with resting, wearing a splint, and taking NSAIDs.  In severe cases, surgery to open the tendon sheath may be needed. This information is not intended to replace advice given to you by your health care provider. Make sure you discuss any questions you have with your health care provider. Document Released: 02/13/2004 Document Revised: 04/07/2017 Document Reviewed: 04/05/2016 Elsevier Patient Education  2020 [employer].

## 2019-03-05 NOTE — Addendum Note (Signed)
Addended by: Francis Dowse T on: 03/05/2019 11:32 AM   Modules accepted: Orders

## 2019-03-05 NOTE — Telephone Encounter (Signed)
CRITICAL VALUE STICKER  CRITICAL VALUE: 18.5 hemoglobin   RECEIVER (on-site recipient of call): Shannon NOTIFIED: 03/05/19 @ 14:10  MESSENGER (representative from lab):  MD NOTIFIED: Dr. Jonni Sanger & Dr. Yong Channel  TIME OF NOTIFICATION: 2:15pm  RESPONSE:

## 2019-03-06 ENCOUNTER — Other Ambulatory Visit: Payer: Self-pay | Admitting: *Deleted

## 2019-03-06 DIAGNOSIS — E291 Testicular hypofunction: Secondary | ICD-10-CM

## 2019-03-06 DIAGNOSIS — D582 Other hemoglobinopathies: Secondary | ICD-10-CM

## 2019-03-06 LAB — TESTOSTERONE TOTAL,FREE,BIO, MALES
Albumin: 4.7 g/dL (ref 3.6–5.1)
Sex Hormone Binding: 52 nmol/L (ref 22–77)
Testosterone, Bioavailable: 177.3 ng/dL — ABNORMAL HIGH (ref 15.0–150.0)
Testosterone, Free: 82.7 pg/mL — ABNORMAL HIGH (ref 6.0–73.0)
Testosterone: 846 ng/dL — ABNORMAL HIGH (ref 250–827)

## 2019-03-06 NOTE — Telephone Encounter (Signed)
See lab result note.

## 2019-03-06 NOTE — Progress Notes (Signed)
Please call patient: I have reviewed his/her lab results. Please let patient know his testosterone is too high now; as well, it has increased his hemoglobin. This can cause his blood to be too thick so we need to decrease supplements. Please hold injections for one month, then return for repeat labs fasting in the am: order test total, free bio males quest for hypogonadism, and cbc . Thanks.

## 2019-03-07 LAB — TSH: TSH: 1.3 u[IU]/mL (ref 0.35–4.50)

## 2019-04-01 ENCOUNTER — Other Ambulatory Visit (INDEPENDENT_AMBULATORY_CARE_PROVIDER_SITE_OTHER): Payer: Medicare Other

## 2019-04-01 ENCOUNTER — Other Ambulatory Visit: Payer: Self-pay

## 2019-04-01 DIAGNOSIS — E291 Testicular hypofunction: Secondary | ICD-10-CM

## 2019-04-01 DIAGNOSIS — D582 Other hemoglobinopathies: Secondary | ICD-10-CM

## 2019-04-01 LAB — CBC WITH DIFFERENTIAL/PLATELET
Basophils Absolute: 0 10*3/uL (ref 0.0–0.1)
Basophils Relative: 0.6 % (ref 0.0–3.0)
Eosinophils Absolute: 0.2 10*3/uL (ref 0.0–0.7)
Eosinophils Relative: 2.9 % (ref 0.0–5.0)
HCT: 52.2 % — ABNORMAL HIGH (ref 39.0–52.0)
Hemoglobin: 17.2 g/dL — ABNORMAL HIGH (ref 13.0–17.0)
Lymphocytes Relative: 40.9 % (ref 12.0–46.0)
Lymphs Abs: 3.1 10*3/uL (ref 0.7–4.0)
MCHC: 32.9 g/dL (ref 30.0–36.0)
MCV: 90.5 fl (ref 78.0–100.0)
Monocytes Absolute: 0.9 10*3/uL (ref 0.1–1.0)
Monocytes Relative: 11.2 % (ref 3.0–12.0)
Neutro Abs: 3.4 10*3/uL (ref 1.4–7.7)
Neutrophils Relative %: 44.4 % (ref 43.0–77.0)
Platelets: 231 10*3/uL (ref 150.0–400.0)
RBC: 5.76 Mil/uL (ref 4.22–5.81)
RDW: 13.7 % (ref 11.5–15.5)
WBC: 7.7 10*3/uL (ref 4.0–10.5)

## 2019-04-02 LAB — TESTOSTERONE TOTAL,FREE,BIO, MALES
Albumin: 4.6 g/dL (ref 3.6–5.1)
Sex Hormone Binding: 49 nmol/L (ref 22–77)
Testosterone: 130 ng/dL — ABNORMAL LOW (ref 250–827)

## 2019-04-09 NOTE — Progress Notes (Signed)
Please call patient: I have reviewed his/her lab results. Blood work is improving. hgb is normalizing. Testosterone is now low as expected. Hold injections for another month, then repeat labs. Please schedule ov with me in 4-8 weeks to discuss and recheck labs. Thanks.

## 2019-04-17 NOTE — Addendum Note (Signed)
Addended by: Billey Chang on: 04/17/2019 05:15 PM   Modules accepted: Orders

## 2019-04-26 ENCOUNTER — Ambulatory Visit (INDEPENDENT_AMBULATORY_CARE_PROVIDER_SITE_OTHER): Payer: Medicare Other

## 2019-04-26 ENCOUNTER — Encounter (INDEPENDENT_AMBULATORY_CARE_PROVIDER_SITE_OTHER): Payer: Self-pay

## 2019-04-26 ENCOUNTER — Other Ambulatory Visit: Payer: Self-pay

## 2019-04-26 DIAGNOSIS — Z0000 Encounter for general adult medical examination without abnormal findings: Secondary | ICD-10-CM

## 2019-04-26 NOTE — Patient Instructions (Signed)
Christopher Mosley , Thank you for taking time to come for your Medicare Wellness Visit. I appreciate your ongoing commitment to your health goals. Please review the following plan we discussed and let me know if I can assist you in the future.   Screening recommendations/referrals: Colorectal Screening: last colonoscopy 07/10/13; please schedule repeat once comfortable   Vision and Dental Exams: Recommended annual ophthalmology exams for early detection of glaucoma and other disorders of the eye Recommended annual dental exams for proper oral hygiene  Vaccinations: Influenza vaccine: completed 02/14/19 Pneumococcal vaccine: up to date; last 09/11/17 Tdap vaccine: up to date; last 12/15/15 Shingles vaccine: Please call your insurance company to determine your out of pocket expense for the Shingrix vaccine. You may receive this vaccine at your local pharmacy.  Advanced directives: Please bring a copy of your POA (Power of Attorney) and/or Living Will to your next appointment.  Goals: Recommend to drink at least 6-8 8oz glasses of water per day and consume a balanced diet rich in fresh fruits and vegetables.   Next appointment: Please schedule your Annual Wellness Visit with your Nurse Health Advisor in one year.  Preventive Care 71 Years and Older, Male Preventive care refers to lifestyle choices and visits with your health care provider that can promote health and wellness. What does preventive care include?  A yearly physical exam. This is also called an annual well check.  Dental exams once or twice a year.  Routine eye exams. Ask your health care provider how often you should have your eyes checked.  Personal lifestyle choices, including:  Daily care of your teeth and gums.  Regular physical activity.  Eating a healthy diet.  Avoiding tobacco and drug use.  Limiting alcohol use.  Practicing safe sex.  Taking low doses of aspirin every day if recommended by your health care  provider..  Taking vitamin and mineral supplements as recommended by your health care provider. What happens during an annual well check? The services and screenings done by your health care provider during your annual well check will depend on your age, overall health, lifestyle risk factors, and family history of disease. Counseling  Your health care provider may ask you questions about your:  Alcohol use.  Tobacco use.  Drug use.  Emotional well-being.  Home and relationship well-being.  Sexual activity.  Eating habits.  History of falls.  Memory and ability to understand (cognition).  Work and work Statistician. Screening  You may have the following tests or measurements:  Height, weight, and BMI.  Blood pressure.  Lipid and cholesterol levels. These may be checked every 5 years, or more frequently if you are over 64 years old.  Skin check.  Lung cancer screening. You may have this screening every year starting at age 12 if you have a 30-pack-year history of smoking and currently smoke or have quit within the past 15 years.  Fecal occult blood test (FOBT) of the stool. You may have this test every year starting at age 107.  Flexible sigmoidoscopy or colonoscopy. You may have a sigmoidoscopy every 5 years or a colonoscopy every 10 years starting at age 39.  Prostate cancer screening. Recommendations will vary depending on your family history and other risks.  Hepatitis C blood test.  Hepatitis B blood test.  Sexually transmitted disease (STD) testing.  Diabetes screening. This is done by checking your blood sugar (glucose) after you have not eaten for a while (fasting). You may have this done every 1-3 years.  Abdominal aortic aneurysm (AAA) screening. You may need this if you are a current or former smoker.  Osteoporosis. You may be screened starting at age 81 if you are at high risk. Talk with your health care provider about your test results, treatment  options, and if necessary, the need for more tests. Vaccines  Your health care provider may recommend certain vaccines, such as:  Influenza vaccine. This is recommended every year.  Tetanus, diphtheria, and acellular pertussis (Tdap, Td) vaccine. You may need a Td booster every 10 years.  Zoster vaccine. You may need this after age 82.  Pneumococcal 13-valent conjugate (PCV13) vaccine. One dose is recommended after age 72.  Pneumococcal polysaccharide (PPSV23) vaccine. One dose is recommended after age 37. Talk to your health care provider about which screenings and vaccines you need and how often you need them. This information is not intended to replace advice given to you by your health care provider. Make sure you discuss any questions you have with your health care provider. Document Released: 05/22/2015 Document Revised: 01/13/2016 Document Reviewed: 02/24/2015 Elsevier Interactive Patient Education  2017 [place] Prevention in the Home Falls can cause injuries. They can happen to people of all ages. There are many things you can do to make your home safe and to help prevent falls. What can I do on the outside of my home?  Regularly fix the edges of walkways and driveways and fix any cracks.  Remove anything that might make you trip as you walk through a door, such as a raised step or threshold.  Trim any bushes or trees on the path to your home.  Use bright outdoor lighting.  Clear any walking paths of anything that might make someone trip, such as rocks or tools.  Regularly check to see if handrails are loose or broken. Make sure that both sides of any steps have handrails.  Any raised decks and porches should have guardrails on the edges.  Have any leaves, snow, or ice cleared regularly.  Use sand or salt on walking paths during winter.  Clean up any spills in your garage right away. This includes oil or grease spills. What can I do in the  bathroom?  Use night lights.  Install grab bars by the toilet and in the tub and shower. Do not use towel bars as grab bars.  Use non-skid mats or decals in the tub or shower.  If you need to sit down in the shower, use a plastic, non-slip stool.  Keep the floor dry. Clean up any water that spills on the floor as soon as it happens.  Remove soap buildup in the tub or shower regularly.  Attach bath mats securely with double-sided non-slip rug tape.  Do not have throw rugs and other things on the floor that can make you trip. What can I do in the bedroom?  Use night lights.  Make sure that you have a light by your bed that is easy to reach.  Do not use any sheets or blankets that are too big for your bed. They should not hang down onto the floor.  Have a firm chair that has side arms. You can use this for support while you get dressed.  Do not have throw rugs and other things on the floor that can make you trip. What can I do in the kitchen?  Clean up any spills right away.  Avoid walking on wet floors.  Keep items that you use  a lot in easy-to-reach places.  If you need to reach something above you, use a strong step stool that has a grab bar.  Keep electrical cords out of the way.  Do not use floor polish or wax that makes floors slippery. If you must use wax, use non-skid floor wax.  Do not have throw rugs and other things on the floor that can make you trip. What can I do with my stairs?  Do not leave any items on the stairs.  Make sure that there are handrails on both sides of the stairs and use them. Fix handrails that are broken or loose. Make sure that handrails are as long as the stairways.  Check any carpeting to make sure that it is firmly attached to the stairs. Fix any carpet that is loose or worn.  Avoid having throw rugs at the top or bottom of the stairs. If you do have throw rugs, attach them to the floor with carpet tape.  Make sure that you have a  light switch at the top of the stairs and the bottom of the stairs. If you do not have them, ask someone to add them for you. What else can I do to help prevent falls?  Wear shoes that:  Do not have high heels.  Have rubber bottoms.  Are comfortable and fit you well.  Are closed at the toe. Do not wear sandals.  If you use a stepladder:  Make sure that it is fully opened. Do not climb a closed stepladder.  Make sure that both sides of the stepladder are locked into place.  Ask someone to hold it for you, if possible.  Clearly mark and make sure that you can see:  Any grab bars or handrails.  First and last steps.  Where the edge of each step is.  Use tools that help you move around (mobility aids) if they are needed. These include:  Canes.  Walkers.  Scooters.  Crutches.  Turn on the lights when you go into a dark area. Replace any light bulbs as soon as they burn out.  Set up your furniture so you have a clear path. Avoid moving your furniture around.  If any of your floors are uneven, fix them.  If there are any pets around you, be aware of where they are.  Review your medicines with your doctor. Some medicines can make you feel dizzy. This can increase your chance of falling. Ask your doctor what other things that you can do to help prevent falls. This information is not intended to replace advice given to you by your health care provider. Make sure you discuss any questions you have with your health care provider. Document Released: 02/19/2009 Document Revised: 10/01/2015 Document Reviewed: 05/30/2014 Elsevier Interactive Patient Education  2017 [employer].

## 2019-04-26 NOTE — Progress Notes (Signed)
This visit is being conducted via phone call due to the COVID-19 pandemic. This patient has given me verbal consent via phone to conduct this visit, patient states they are participating from their home address. Some vital signs may be absent or patient reported.   Patient identification: identified by name, DOB, and current address.  Location provider: [facility], Office Persons participating in the virtual visit: Denman George LPN, patient, spouse Alinda Money), and Dr. Billey Chang   Subjective:   Christopher Mosley is a 73 y.o. male who presents for an Initial Medicare Annual Wellness Visit.  Review of Systems   Cardiac Risk Factors include: advanced age (>32men, >101 women);hypertension;male gender;dyslipidemia   Objective:    There were no vitals filed for this visit. There is no height or weight on file to calculate BMI.  Advanced Directives 04/26/2019  Does Patient Have a Medical Advance Directive? Yes  Type of Advance Directive Living will;Healthcare Power of Attorney  Does patient want to make changes to medical advance directive? No - Patient declined  Copy of [place] in Chart? No - copy requested    Current Medications (verified) Outpatient Encounter Medications as of 04/26/2019  Medication Sig  . amLODipine (NORVASC) 5 MG tablet TAKE 1 TABLET BY MOUTH EVERY DAY  . aspirin EC 81 MG tablet Take 81 mg by mouth daily.  Marland Kitchen atorvastatin (LIPITOR) 40 MG tablet Take 40 mg by mouth daily.  Marland Kitchen levothyroxine (SYNTHROID) 75 MCG tablet TAKE 1 TABLET BY MOUTH ONCE DAILY BEFORE  BREAKFAST  . testosterone cypionate (DEPOTESTOSTERONE CYPIONATE) 200 MG/ML injection Inject 0.5 mLs (100 mg total) into the muscle every 14 (fourteen) days.   No facility-administered encounter medications on file as of 04/26/2019.    Allergies (verified) Codeine and Sulfa antibiotics   History: Past Medical History:  Diagnosis Date  . Colon polyps 09/08/2017   q 5 years;  need records  . Heart murmur   . History of hepatitis B   . Hyperlipemia   . Hypertension   . Lesion of vocal cord 02/06/2017   Right granuloma; resolved on f/u  . Melanoma ([place])   . Papillary thyroid carcinoma ([place])    s/p thyroidectomy  . Urine incontinence    Past Surgical History:  Procedure Laterality Date  . BLADDER SURGERY  2016  . [place]  . COLONOSCOPY     multiple - hx adenomas 2010 and 2015  . MELANOMA EXCISION    . Pottts shunt  1957  . PULMONARY VALVE REPLACEMENT  10/21/2016   pig valve  . TETRALOGY OF FALLOT REPAIR  1961  . TOTAL KNEE ARTHROPLASTY Left 2016  . TOTAL THYROIDECTOMY  2002   Family History  Problem Relation Age of Onset  . Alcohol abuse Mother   . Diabetes Mother   . Depression Mother   . Early death Mother   . Hyperlipidemia Mother   . Miscarriages / [place] Mother   . Hypertension Mother   . Stomach cancer Mother   . Alcohol abuse Father   . Diabetes Father   . Early death Father   . Heart attack Father   . Heart disease Father   . Hypertension Father   . Pancreatic cancer Father   . Arthritis Brother   . Diabetes Brother   . Stroke Brother   . Renal cancer Brother   . Early death Maternal Grandmother   . Heart attack Maternal Grandmother   . Alcohol abuse Maternal Grandfather   .  Heart attack Maternal Grandfather   . Cancer Paternal Grandfather   . Asthma Brother   . Drug abuse Brother   . Healthy Daughter   . Healthy Son    Social History   Socioeconomic History  . Marital status: Married    Spouse name: Not on file  . Number of children: Not on file  . Years of education: Not on file  . Highest education level: Not on file  Occupational History  . Occupation: Retired Optometrist  Tobacco Use  . Smoking status: Never Smoker  . Smokeless tobacco: Never Used  Substance and Sexual Activity  . Alcohol use: Yes  . Drug use: Never  . Sexual activity: Yes  Other Topics Concern  . Not on file  Social  History Narrative  . Not on file   Social Determinants of Health   Financial Resource Strain:   . Difficulty of Paying Living Expenses: Not on file  Food Insecurity:   . Worried About Charity fundraiser in the Last Year: Not on file  . Ran Out of Food in the Last Year: Not on file  Transportation Needs:   . Lack of Transportation (Medical): Not on file  . Lack of Transportation (Non-Medical): Not on file  Physical Activity:   . Days of Exercise per Week: Not on file  . Minutes of Exercise per Session: Not on file  Stress:   . Feeling of Stress : Not on file  Social Connections:   . Frequency of Communication with Friends and Family: Not on file  . Frequency of Social Gatherings with Friends and Family: Not on file  . Attends Religious Services: Not on file  . Active Member of Clubs or Organizations: Not on file  . Attends Archivist Meetings: Not on file  . Marital Status: Not on file   Tobacco Counseling Counseling given: Not Answered   Clinical Intake:  Pre-visit preparation completed: Yes  Pain : No/denies pain  Diabetes: No  How often do you need to have someone help you when you read instructions, pamphlets, or other written materials from your doctor or pharmacy?: 1 - Never  Interpreter Needed?: No  Information entered by :: Denman George LPN  Activities of Daily Living In your present state of health, do you have any difficulty performing the following activities: 04/26/2019 03/05/2019  Hearing? N N  Vision? N Y  Difficulty concentrating or making decisions? N N  Walking or climbing stairs? N N  Dressing or bathing? N N  Doing errands, shopping? N N  Preparing Food and eating ? N -  Using the Toilet? N -  In the past six months, have you accidently leaked urine? N -  Do you have problems with loss of bowel control? N -  Managing your Medications? N -  Managing your Finances? N -  Housekeeping or managing your Housekeeping? N -  Some recent  data might be hidden     Immunizations and Health Maintenance Immunization History  Administered Date(s) Administered  . Fluad Quad(high Dose 65+) 02/14/2019  . Pneumococcal Conjugate-13 09/11/2017  . Pneumococcal Polysaccharide-23 05/09/2008, 05/09/2013  . Pneumococcal-Unspecified 09/11/2017  . Tdap 12/15/2015  . Zoster 05/09/2013   There are no preventive care reminders to display for this patient.  Patient Care Team: Leamon Arnt, MD as PCP - General (Family Medicine) Rollene Fare Alvina Chou, MD as Referring Physician (Cardiology)  Indicate any recent Medical Services you may have received from other than [facility] providers in  the past year (date may be approximate).    Assessment:   This is a routine wellness examination for Eulis.  Hearing/Vision screen No exam data present  Dietary issues and exercise activities discussed: Current Exercise Habits: Home exercise routine, Type of exercise: walking, Time (Minutes): 30, Frequency (Times/Week): 3, Weekly Exercise (Minutes/Week): 90, Intensity: Mild  Goals   None    Depression Screen PHQ 2/9 Scores 04/26/2019 03/05/2019 07/11/2018 11/16/2017  PHQ - 2 Score 0 0 0 0  PHQ- 9 Score - - - 0    Fall Risk Fall Risk  04/26/2019 03/05/2019 07/11/2018 09/08/2017  Falls in the past year? 0 0 0 No  Number falls in past yr: - 0 0 -  Injury with Fall? 0 0 0 -  Risk for fall due to : - History of fall(s) - -  Follow up Falls evaluation completed;Education provided;Falls prevention discussed Falls evaluation completed Falls evaluation completed -    Is the patient's home free of loose throw rugs in walkways, pet beds, electrical cords, etc?   yes      Grab bars in the bathroom? yes      Handrails on the stairs?   yes      Adequate lighting?   yes  Cognitive Function: no cognitive concerns at this time  Cognitive Testing  Alert? Yes         Normal Appearance? N/a  Oriented to person? Yes           Place? Yes  Time? Yes  Recall of three  objects? Yes  Can perform simple calculations? Yes  Displays appropriate judgment? Yes  Can read the correct time from a watch face? Yes   Screening Tests Health Maintenance  Topic Date Due  . COLONOSCOPY  03/04/2020 (Originally 07/11/2018)  . TETANUS/TDAP  12/14/2025  . Hepatitis C Screening  Completed  . INFLUENZA VACCINE  Discontinued  . PNA vac Low Risk Adult  Discontinued    Qualifies for Shingles Vaccine? Discussed and patient will check with pharmacy for coverage.  Patient education handout provided   Cancer Screenings: Lung: Low Dose CT Chest recommended if Age 25-80 years, 30 pack-year currently smoking OR have quit w/in 15years. Patient does not qualify. Colorectal: colonoscopy 07/10/13; repeat deferred at this time     Plan:  I have personally reviewed and addressed the Medicare Annual Wellness questionnaire and have noted the following in the patient's chart:  A. Medical and social history B. Use of alcohol, tobacco or illicit drugs  C. Current medications and supplements D. Functional ability and status E.  Nutritional status F.  Physical activity G. Advance directives H. List of other physicians I.  Hospitalizations, surgeries, and ER visits in previous 12 months J.  [place] such as hearing and vision if needed, cognitive and depression L. Referrals, records requested, and appointments- none   In addition, I have reviewed and discussed with patient certain preventive protocols, quality metrics, and best practice recommendations. A written personalized care plan for preventive services as well as general preventive health recommendations were provided to patient.   Signed,  Denman George, LPN  Nurse Health Advisor   Nurse Notes: no additional

## 2019-05-30 ENCOUNTER — Other Ambulatory Visit: Payer: Self-pay

## 2019-05-30 ENCOUNTER — Other Ambulatory Visit: Payer: Medicare Other

## 2019-05-31 ENCOUNTER — Other Ambulatory Visit (INDEPENDENT_AMBULATORY_CARE_PROVIDER_SITE_OTHER): Payer: Medicare Other

## 2019-05-31 DIAGNOSIS — E291 Testicular hypofunction: Secondary | ICD-10-CM | POA: Diagnosis not present

## 2019-05-31 DIAGNOSIS — D582 Other hemoglobinopathies: Secondary | ICD-10-CM | POA: Diagnosis not present

## 2019-05-31 LAB — CBC WITH DIFFERENTIAL/PLATELET
Basophils Absolute: 0.1 10*3/uL (ref 0.0–0.1)
Basophils Relative: 0.6 % (ref 0.0–3.0)
Eosinophils Absolute: 0.3 10*3/uL (ref 0.0–0.7)
Eosinophils Relative: 3 % (ref 0.0–5.0)
HCT: 47.1 % (ref 39.0–52.0)
Hemoglobin: 15.6 g/dL (ref 13.0–17.0)
Lymphocytes Relative: 43.4 % (ref 12.0–46.0)
Lymphs Abs: 3.7 10*3/uL (ref 0.7–4.0)
MCHC: 33.1 g/dL (ref 30.0–36.0)
MCV: 90.7 fl (ref 78.0–100.0)
Monocytes Absolute: 0.9 10*3/uL (ref 0.1–1.0)
Monocytes Relative: 10.5 % (ref 3.0–12.0)
Neutro Abs: 3.6 10*3/uL (ref 1.4–7.7)
Neutrophils Relative %: 42.5 % — ABNORMAL LOW (ref 43.0–77.0)
Platelets: 215 10*3/uL (ref 150.0–400.0)
RBC: 5.19 Mil/uL (ref 4.22–5.81)
RDW: 13.8 % (ref 11.5–15.5)
WBC: 8.5 10*3/uL (ref 4.0–10.5)

## 2019-06-03 LAB — TESTOSTERONE TOTAL,FREE,BIO, MALES
Albumin: 4.4 g/dL (ref 3.6–5.1)
Sex Hormone Binding: 57 nmol/L (ref 22–77)
Testosterone: 19 ng/dL — ABNORMAL LOW (ref 250–827)

## 2019-06-03 NOTE — Progress Notes (Signed)
Please call patient: I have reviewed his/her lab results. His blood count levels have now normalized. He may restart his testosterone replacement but at half dose to start, so inject 100mg  IM monthly. We need to recheck his fasting levels again in 3 months. CBC and testosterone as ordered last time. He is schedule for an ov so we will check then unless it is in the afternoon. thanks  09/03/2019

## 2019-09-03 ENCOUNTER — Ambulatory Visit: Payer: No Typology Code available for payment source | Admitting: Family Medicine

## 2019-09-06 ENCOUNTER — Other Ambulatory Visit: Payer: Self-pay

## 2019-09-09 ENCOUNTER — Encounter: Payer: Self-pay | Admitting: Family Medicine

## 2019-09-09 ENCOUNTER — Other Ambulatory Visit: Payer: Self-pay

## 2019-09-09 ENCOUNTER — Ambulatory Visit (INDEPENDENT_AMBULATORY_CARE_PROVIDER_SITE_OTHER): Payer: Medicare Other | Admitting: Family Medicine

## 2019-09-09 VITALS — BP 128/80 | HR 63 | Temp 98.6°F | Resp 18 | Ht 66.0 in | Wt 162.4 lb

## 2019-09-09 DIAGNOSIS — E291 Testicular hypofunction: Secondary | ICD-10-CM | POA: Diagnosis not present

## 2019-09-09 DIAGNOSIS — I10 Essential (primary) hypertension: Secondary | ICD-10-CM | POA: Diagnosis not present

## 2019-09-09 LAB — BASIC METABOLIC PANEL
BUN: 21 mg/dL (ref 6–23)
CO2: 29 mEq/L (ref 19–32)
Calcium: 9.9 mg/dL (ref 8.4–10.5)
Chloride: 100 mEq/L (ref 96–112)
Creatinine, Ser: 1.19 mg/dL (ref 0.40–1.50)
GFR: 60.11 mL/min (ref >60.00)
Glucose, Bld: 170 mg/dL — ABNORMAL HIGH (ref 70–99)
Potassium: 4.8 mEq/L (ref 3.5–5.1)
Sodium: 139 mEq/L (ref 135–145)

## 2019-09-09 LAB — CBC WITH DIFFERENTIAL/PLATELET
Basophils Absolute: 0.1 10*3/uL (ref 0.0–0.1)
Basophils Relative: 0.7 % (ref 0.0–3.0)
Eosinophils Absolute: 0.2 10*3/uL (ref 0.0–0.7)
Eosinophils Relative: 3 % (ref 0.0–5.0)
HCT: 45.1 % (ref 39.0–52.0)
Hemoglobin: 15.2 g/dL (ref 13.0–17.0)
Lymphocytes Relative: 32.7 % (ref 12.0–46.0)
Lymphs Abs: 2.7 10*3/uL (ref 0.7–4.0)
MCHC: 33.6 g/dL (ref 30.0–36.0)
MCV: 93.7 fl (ref 78.0–100.0)
Monocytes Absolute: 0.9 10*3/uL (ref 0.1–1.0)
Monocytes Relative: 10.5 % (ref 3.0–12.0)
Neutro Abs: 4.4 10*3/uL (ref 1.4–7.7)
Neutrophils Relative %: 53.1 % (ref 43.0–77.0)
Platelets: 222 10*3/uL (ref 150.0–400.0)
RBC: 4.81 Mil/uL (ref 4.22–5.81)
RDW: 13.3 % (ref 11.5–15.5)
WBC: 8.2 10*3/uL (ref 4.0–10.5)

## 2019-09-09 NOTE — Patient Instructions (Signed)
Please return in 6 months for your annual complete physical; please come fasting.   I will release your lab results to you on your MyChart account with further instructions. Please reply with any questions.    If you have any questions or concerns, please don't hesitate to send me a message via MyChart or call the office at [phone]. Thank you for visiting with us today! It's our pleasure caring for you.  

## 2019-09-09 NOTE — Progress Notes (Signed)
Subjective  CC:  Chief Complaint  Patient presents with  . Hypertension    No new concerns at this time.     HPI: Christopher Mosley is a 74 y.o. male who presents to the office today to address the problems listed above in the chief complaint.  Hypertension f/u: Control is good . Pt reports he is doing well. taking medications as instructed, no medication side effects noted, no TIAs, no chest pain on exertion, no dyspnea on exertion, no swelling of ankles. He denies adverse effects from his BP medications. Compliance with medication is good.   Low T: back on 1/2 dose replacement and feeling much better again: improved energy. No AEs. Due for lab recheck  Assessment  1. Essential hypertension   2. Hypogonadism in male      Plan    Hypertension f/u: BP control is well controlled. Continue current meds  Hypogonadism with low T f/u: recheck levels and adjust dose to goal. Pt is symptomatically improved.  Education regarding management of these chronic disease states was given. Management strategies discussed on successive visits include dietary and exercise recommendations, goals of achieving and maintaining IBW, and lifestyle modifications aiming for adequate sleep and minimizing stressors.   Follow up: 6 months for cpe  Orders Placed This Encounter  Procedures  . CBC with Differential/Platelet  . Testosterone Total,Free,Bio, Males  . Basic metabolic panel   No orders of the defined types were placed in this encounter.     BP Readings from Last 3 Encounters:  09/09/19 128/80  03/05/19 134/88  07/11/18 122/60   Wt Readings from Last 3 Encounters:  09/09/19 162 lb 6.4 oz (73.7 kg)  03/05/19 159 lb 12.8 oz (72.5 kg)  07/11/18 163 lb 12.8 oz (74.3 kg)    Lab Results  Component Value Date   CHOL 170 03/05/2019   CHOL 136 01/10/2018   Lab Results  Component Value Date   HDL 36.50 (L) 03/05/2019   HDL 32.00 (L) 01/10/2018   Lab Results  Component Value Date    LDLCALC 115 (H) 03/05/2019   LDLCALC 91 01/10/2018   Lab Results  Component Value Date   TRIG 95.0 03/05/2019   TRIG 67.0 01/10/2018   Lab Results  Component Value Date   CHOLHDL 5 03/05/2019   CHOLHDL 4 01/10/2018   No results found for: LDLDIRECT Lab Results  Component Value Date   CREATININE 1.15 03/05/2019   BUN 15 03/05/2019   NA 138 03/05/2019   K 4.8 03/05/2019   CL 98 03/05/2019   CO2 33 (H) 03/05/2019    The 10-year ASCVD risk score [name], et al., 2013) is: 23.5%   Values used to calculate the score:     Age: 27 years     Sex: Male     Is Non-Hispanic African American: No     Diabetic: No     Tobacco smoker: No     Systolic Blood Pressure: 0000000 mmHg     Is BP treated: Yes     HDL Cholesterol: 36.5 mg/dL     Total Cholesterol: 170 mg/dL  I reviewed the patients updated PMH, FH, and SocHx.    Patient Active Problem List   Diagnosis Date Noted  . Erectile dysfunction 10/30/2017  . History of thyroid cancer 09/08/2017  . Mixed hyperlipidemia 09/08/2017  . Essential hypertension 09/08/2017  . Colon polyps 09/08/2017  . Vocal cord dysfunction 03/29/2017  . History of Blalock-Taussig shunt 12/15/2015  . Tetralogy  of Fallot s/p repair 12/15/2015  . Hypogonadism in male 08/12/2015  . Postoperative hypothyroidism 08/12/2015    Allergies: Codeine and Sulfa antibiotics  Social History: Patient  reports that he has never smoked. He has never used smokeless tobacco. He reports current alcohol use. He reports that he does not use drugs.  Current Meds  Medication Sig  . amLODipine (NORVASC) 5 MG tablet TAKE 1 TABLET BY MOUTH EVERY DAY  . aspirin EC 81 MG tablet Take 81 mg by mouth daily.  Marland Kitchen atorvastatin (LIPITOR) 40 MG tablet Take 40 mg by mouth daily.  Marland Kitchen levothyroxine (SYNTHROID) 75 MCG tablet TAKE 1 TABLET BY MOUTH ONCE DAILY BEFORE  BREAKFAST  . testosterone cypionate (DEPOTESTOSTERONE CYPIONATE) 200 MG/ML injection Inject 0.5 mLs (100 mg total)  into the muscle every 14 (fourteen) days. (Patient taking differently: Inject 100 mg into the muscle every 30 (thirty) days. )    Review of Systems: Cardiovascular: negative for chest pain, palpitations, leg swelling, orthopnea Respiratory: negative for SOB, wheezing or persistent cough Gastrointestinal: negative for abdominal pain Genitourinary: negative for dysuria or gross hematuria  Objective  Vitals: BP 128/80   Pulse 63   Temp 98.6 F (37 C) (Temporal)   Resp 18   Ht 5\' 6"  (1.676 m)   Wt 162 lb 6.4 oz (73.7 kg)   SpO2 97%   BMI 26.21 kg/m  General: no acute distress  Psych:  Alert and oriented, normal mood and affect HEENT:  Normocephalic, atraumatic, supple neck  Cardiovascular:  RRR with murmur, no edema Respiratory:  Good breath sounds bilaterally, CTAB with normal respiratory effort   Commons side effects, risks, benefits, and alternatives for medications and treatment plan prescribed today were discussed, and the patient expressed understanding of the given instructions. Patient is instructed to call or message via MyChart if he/she has any questions or concerns regarding our treatment plan. No barriers to understanding were identified. We discussed Red Flag symptoms and signs in detail. Patient expressed understanding regarding what to do in case of urgent or emergency type symptoms.   Medication list was reconciled, printed and provided to the patient in AVS. Patient instructions and summary information was reviewed with the patient as documented in the AVS. This note was prepared with assistance of Dragon voice recognition software. Occasional wrong-word or sound-a-like substitutions may have occurred due to the inherent limitations of voice recognition software  This visit occurred during the SARS-CoV-2 public health emergency.  Safety protocols were in place, including screening questions prior to the visit, additional usage of staff PPE, and extensive cleaning of exam  room while observing appropriate contact time as indicated for disinfecting solutions.

## 2019-09-10 LAB — TESTOSTERONE TOTAL,FREE,BIO, MALES
Albumin: 4.6 g/dL (ref 3.6–5.1)
Sex Hormone Binding: 51 nmol/L (ref 22–77)
Testosterone: 135 ng/dL — ABNORMAL LOW (ref 250–827)

## 2019-09-10 NOTE — Progress Notes (Signed)
Please call patient: I have reviewed his/her lab results: testosterone level is improved but remains a little low. Please ask him to increase his T dose to 150mg  IM monthly and update dose on his med list. IF however, he is feeling well, he can stay at the 100mg  q month dose.  Hgb is stable.  I will recheck his sugar which was elevated at his next cpe visit.  Thanks.  (will need a PSA and lipid panel as well)

## 2019-09-11 ENCOUNTER — Other Ambulatory Visit: Payer: Self-pay

## 2019-09-11 MED ORDER — TESTOSTERONE CYPIONATE 200 MG/ML IM SOLN: 150.0000 mg | INTRAMUSCULAR | 3 refills | Status: DC

## 2019-11-14 DIAGNOSIS — Z953 Presence of xenogenic heart valve: Secondary | ICD-10-CM | POA: Insufficient documentation

## 2020-01-07 ENCOUNTER — Other Ambulatory Visit: Payer: Self-pay | Admitting: Family Medicine

## 2020-01-07 NOTE — Telephone Encounter (Signed)
LR: 09-11-2019 Qty: 25mL with 3 refills   Last office visit: 09-09-2019 Upcoming appointment: 03-11-2020

## 2020-01-09 ENCOUNTER — Other Ambulatory Visit: Payer: Self-pay | Admitting: Family Medicine

## 2020-03-10 ENCOUNTER — Ambulatory Visit: Payer: Medicare Other | Admitting: Podiatry

## 2020-03-10 ENCOUNTER — Other Ambulatory Visit: Payer: Self-pay

## 2020-03-10 ENCOUNTER — Ambulatory Visit (INDEPENDENT_AMBULATORY_CARE_PROVIDER_SITE_OTHER): Payer: Medicare Other

## 2020-03-10 VITALS — BP 136/84 | HR 60 | Temp 98.5°F

## 2020-03-10 DIAGNOSIS — M722 Plantar fascial fibromatosis: Secondary | ICD-10-CM

## 2020-03-10 MED ORDER — MELOXICAM 15 MG PO TABS: 15.0000 mg | ORAL_TABLET | Freq: Every day | ORAL | 3 refills | Status: DC

## 2020-03-10 MED ORDER — METHYLPREDNISOLONE 4 MG PO TBPK: ORAL_TABLET | ORAL | 0 refills | Status: DC

## 2020-03-10 NOTE — Progress Notes (Signed)
Subjective:  Patient ID: Christopher Mosley, male    DOB: 1946-05-02,  MRN: [id number] HPI Chief Complaint  Patient presents with  . Plantar Fasciitis    left foot     74 y.o. male presents with the above complaint.   ROS: Denies fever chills nausea vomiting muscle aches pains calf pain back pain chest pain shortness of breath.  Past Medical History:  Diagnosis Date  . Colon polyps 09/08/2017   q 5 years; need records  . Heart murmur   . History of hepatitis B   . Hyperlipemia   . Hypertension   . Lesion of vocal cord 02/06/2017   Right granuloma; resolved on f/u  . Melanoma ([place])   . Papillary thyroid carcinoma ([place])    s/p thyroidectomy  . Urine incontinence    Past Surgical History:  Procedure Laterality Date  . BLADDER SURGERY  2016  . [place]  . COLONOSCOPY     multiple - hx adenomas 2010 and 2015  . MELANOMA EXCISION    . Pottts shunt  1957  . PULMONARY VALVE REPLACEMENT  10/21/2016   pig valve  . TETRALOGY OF FALLOT REPAIR  1961  . TOTAL KNEE ARTHROPLASTY Left 2016  . TOTAL THYROIDECTOMY  2002    Current Outpatient Medications:  .  amLODipine (NORVASC) 5 MG tablet, TAKE 1 TABLET BY MOUTH EVERY DAY, Disp: , Rfl:  .  aspirin EC 81 MG tablet, Take 81 mg by mouth daily., Disp: , Rfl:  .  atorvastatin (LIPITOR) 40 MG tablet, Take 40 mg by mouth daily., Disp: , Rfl: 3 .  levothyroxine (SYNTHROID) 75 MCG tablet, TAKE 1 TABLET BY MOUTH ONCE DAILY BEFORE BREAKFAST, Disp: 90 tablet, Rfl: 3 .  meloxicam (MOBIC) 15 MG tablet, Take 1 tablet (15 mg total) by mouth daily., Disp: 30 tablet, Rfl: 3 .  methylPREDNISolone (MEDROL DOSEPAK) 4 MG TBPK tablet, 6 day dose pack - take as directed, Disp: 21 tablet, Rfl: 0 .  testosterone cypionate (DEPOTESTOSTERONE CYPIONATE) 200 MG/ML injection, INJECT 0.75 ML INTO MUSCLE EVERY 30 DAYS, Disp: 1 mL, Rfl: 3  Allergies  Allergen Reactions  . Codeine Nausea And Vomiting  . Sulfa Antibiotics Diarrhea and Rash    Review of Systems Objective:   Vitals:   03/10/20 0903  BP: 136/84  Pulse: 60  Temp: 98.5 F (36.9 C)    General: Well developed, nourished, in no acute distress, alert and oriented x3   Dermatological: Skin is warm, dry and supple bilateral. Nails x 10 are well maintained; remaining integument appears unremarkable at this time. There are no open sores, no preulcerative lesions, no rash or signs of infection present.  Vascular: Dorsalis Pedis artery and Posterior Tibial artery pedal pulses are 2/4 bilateral with immedate capillary fill time. Pedal hair growth present. No varicosities and no lower extremity edema present bilateral.   Neruologic: Grossly intact via light touch bilateral. Vibratory intact via tuning fork bilateral. Protective threshold with Semmes Wienstein monofilament intact to all pedal sites bilateral. Patellar and Achilles deep tendon reflexes 2+ bilateral. No Babinski or clonus noted bilateral.   Musculoskeletal: No gross boney pedal deformities bilateral. No pain, crepitus, or limitation noted with foot and ankle range of motion bilateral. Muscular strength 5/5 in all groups tested bilateral.  Pain on palpation medial calcaneal tubercle of the left heel.  Gait: Unassisted, Nonantalgic.    Radiographs:  Radiographs taken today demonstrate an osseously mature individual soft tissue increase in density plantar fascial kidney insertion  site.  Plantar distally oriented calcaneal heel spur 2 different spurs and one posteriorly as well.  Mild thickening of the Achilles.  Assessment & Plan:   Assessment: Plantar fasciitis left.  Plan: Discussed etiology pathology conservative surgical therapies at this point I injected his left heel today placed him in a plantar fascial brace start him on methylprednisolone to be followed by meloxicam.  Discussed appropriate shoe gear stretching exercise and ice therapy.     Aiven Kampe T. [place], [place]

## 2020-03-10 NOTE — Patient Instructions (Signed)
Plantar Fasciitis Rehab Ask your health care provider which exercises are safe for you. Do exercises exactly as told by your health care provider and adjust them as directed. It is normal to feel mild stretching, pulling, tightness, or discomfort as you do these exercises. Stop right away if you feel sudden pain or your pain gets worse. Do not begin these exercises until told by your health care provider. Stretching and range-of-motion exercises These exercises warm up your muscles and joints and improve the movement and flexibility of your foot. These exercises also help to relieve pain. Plantar fascia stretch  Sit with your left / right leg crossed over your opposite knee. Hold your heel with one hand with that thumb near your arch. With your other hand, hold your toes and gently pull them back toward the top of your foot. You should feel a stretch on the bottom of your toes or your foot (plantar fascia) or both. Hold this stretch for__________ seconds. Slowly release your toes and return to the starting position. Repeat __________ times. Complete this exercise __________ times a day. Gastrocnemius stretch, standing This exercise is also called a calf (gastroc) stretch. It stretches the muscles in the back of the upper calf. Stand with your hands against a wall. Extend your left / right leg behind you, and bend your front knee slightly. Keeping your heels on the floor and your back knee straight, shift your weight toward the wall. Do not arch your back. You should feel a gentle stretch in your upper left / right calf. Hold this position for __________ seconds. Repeat __________ times. Complete this exercise __________ times a day. Soleus stretch, standing This exercise is also called a calf (soleus) stretch. It stretches the muscles in the back of the lower calf. Stand with your hands against a wall. Extend your left / right leg behind you, and bend your front knee slightly. Keeping your  heels on the floor, bend your back knee and shift your weight slightly over your back leg. You should feel a gentle stretch deep in your lower calf. Hold this position for __________ seconds. Repeat __________ times. Complete this exercise __________ times a day. Gastroc and soleus stretch, standing step This exercise stretches the muscles in the back of the lower leg. These muscles are in the upper calf (gastrocnemius) and the lower calf (soleus). Stand with the ball of your left / right foot on a step. The ball of your foot is on the walking surface, right under your toes. Keep your other foot firmly on the same step. Hold on to the wall or a railing for balance. Slowly lift your other foot, allowing your body weight to press your left / right heel down over the edge of the step. You should feel a stretch in your left / right calf. Hold this position for __________ seconds. Return both feet to the step. Repeat this exercise with a slight bend in your left / right knee. Repeat __________ times with your left / right knee straight and __________ times with your left / right knee bent. Complete this exercise __________ times a day. Balance exercise This exercise builds your balance and strength control of your arch to help take pressure off your plantar fascia. Single leg stand If this exercise is too easy, you can try it with your eyes closed or while standing on a pillow. Without shoes, stand near a railing or in a doorway. You may hold on to the railing or door frame  as needed. Stand on your left / right foot. Keep your big toe down on the floor and try to keep your arch lifted. Do not let your foot roll inward. Hold this position for __________ seconds. Repeat __________ times. Complete this exercise __________ times a day. This information is not intended to replace advice given to you by your health care provider. Make sure you discuss any questions you have with your health care  provider. Document Revised: 08/16/2018 Document Reviewed: 02/21/2018 Elsevier Patient Education  [place]. Plantar Fasciitis  Plantar fasciitis is a painful foot condition that affects the heel. It occurs when the band of tissue that connects the toes to the heel bone (plantar fascia) becomes irritated. This can happen as the result of exercising too much or doing other repetitive activities (overuse injury). The pain from plantar fasciitis can range from mild irritation to severe pain that makes it difficult to walk or move. The pain is usually worse in the morning after sleeping, or after sitting or lying down for a while. Pain may also be worse after long periods of walking or standing. What are the causes? This condition may be caused by:  Standing for long periods of time.  Wearing shoes that do not have good arch support.  Doing activities that put stress on joints (high-impact activities), including running, aerobics, and ballet.  Being overweight.  An abnormal way of walking (gait).  Tight muscles in the back of your lower leg (calf).  High arches in your feet.  Starting a new athletic activity. What are the signs or symptoms? The main symptom of this condition is heel pain. Pain may:  Be worse with first steps after a time of rest, especially in the morning after sleeping or after you have been sitting or lying down for a while.  Be worse after long periods of standing still.  Decrease after 30-45 minutes of activity, such as gentle walking. How is this diagnosed? This condition may be diagnosed based on your medical history and your symptoms. Your health care provider may ask questions about your activity level. Your health care provider will do a physical exam to check for:  A tender area on the bottom of your foot.  A high arch in your foot.  Pain when you move your foot.  Difficulty moving your foot. You may have imaging tests to confirm the  diagnosis, such as:  X-rays.  Ultrasound.  MRI. How is this treated? Treatment for plantar fasciitis depends on how severe your condition is. Treatment may include:  Rest, ice, applying pressure (compression), and raising the affected foot (elevation). This may be called RICE therapy. Your health care provider may recommend RICE therapy along with over-the-counter pain medicines to manage your pain.  Exercises to stretch your calves and your plantar fascia.  A splint that holds your foot in a stretched, upward position while you sleep (night splint).  Physical therapy to relieve symptoms and prevent problems in the future.  Injections of steroid medicine (cortisone) to relieve pain and inflammation.  Stimulating your plantar fascia with electrical impulses (extracorporeal shock wave therapy). This is usually the last treatment option before surgery.  Surgery, if other treatments have not worked after 12 months. Follow these instructions at home:  Managing pain, stiffness, and swelling  If directed, put ice on the painful area: ? Put ice in a plastic bag, or use a frozen bottle of water. ? Place a towel between your skin and the bag or  bottle. ? Roll the bottom of your foot over the bag or bottle. ? Do this for 20 minutes, 2-3 times a day.  Wear athletic shoes that have air-sole or gel-sole cushions, or try wearing soft shoe inserts that are designed for plantar fasciitis.  Raise (elevate) your foot above the level of your heart while you are sitting or lying down. Activity  Avoid activities that cause pain. Ask your health care provider what activities are safe for you.  Do physical therapy exercises and stretches as told by your health care provider.  Try activities and forms of exercise that are easier on your joints (low-impact). Examples include swimming, water aerobics, and biking. General instructions  Take over-the-counter and prescription medicines only as told by  your health care provider.  Wear a night splint while sleeping, if told by your health care provider. Loosen the splint if your toes tingle, become numb, or turn cold and blue.  Maintain a healthy weight, or work with your health care provider to lose weight as needed.  Keep all follow-up visits as told by your health care provider. This is important. Contact a health care provider if you:  Have symptoms that do not go away after caring for yourself at home.  Have pain that gets worse.  Have pain that affects your ability to move or do your daily activities. Summary  Plantar fasciitis is a painful foot condition that affects the heel. It occurs when the band of tissue that connects the toes to the heel bone (plantar fascia) becomes irritated.  The main symptom of this condition is heel pain that may be worse after exercising too much or standing still for a long time.  Treatment varies, but it usually starts with rest, ice, compression, and elevation (RICE therapy) and over-the-counter medicines to manage pain. This information is not intended to replace advice given to you by your health care provider. Make sure you discuss any questions you have with your health care provider. Document Revised: 04/07/2017 Document Reviewed: 02/20/2017 Elsevier Patient Education  2020 [employer].

## 2020-03-11 ENCOUNTER — Encounter: Payer: Medicare Other | Admitting: Family Medicine

## 2020-04-07 ENCOUNTER — Ambulatory Visit: Payer: Medicare Other | Admitting: Podiatry

## 2020-05-12 ENCOUNTER — Other Ambulatory Visit: Payer: Self-pay | Admitting: Urology

## 2020-06-15 NOTE — Patient Instructions (Addendum)
DUE TO COVID-19 ONLY ONE VISITOR IS ALLOWED TO COME WITH YOU AND STAY IN THE WAITING ROOM ONLY DURING PRE OP AND PROCEDURE DAY OF SURGERY. THE 1 VISITOR  MAY VISIT WITH YOU AFTER SURGERY IN YOUR PRIVATE ROOM DURING VISITING HOURS ONLY!  YOU NEED TO HAVE A COVID 19 TEST ON: 06/23/20 @ 2:00 PM , THIS TEST MUST BE DONE BEFORE SURGERY,  COVID TESTING SITE [place] [address], IT IS ON THE RIGHT GOING OUT [address] APPROXIMATELY  2 MINUTES PAST [employer] ON THE RIGHT. ONCE YOUR COVID TEST IS COMPLETED,  PLEASE BEGIN THE QUARANTINE INSTRUCTIONS AS OUTLINED IN YOUR HANDOUT.                Christopher Mosley   Your procedure is scheduled on: 06/26/20   Report to [facility] Main  Entrance   Report to short stay at : 5:30 AM     Call this number if you have problems the morning of surgery [phone]    Remember: Do not eat food or drink liquids :After Midnight.   BRUSH YOUR TEETH MORNING OF SURGERY AND RINSE YOUR MOUTH OUT, NO CHEWING GUM CANDY OR MINTS.    Take these medicines the morning of surgery with A SIP OF WATER: synthroid,amlodipine.                                You may not have any metal on your body including hair pins and              piercings  Do not wear jewelry, lotions, powders or perfumes, deodorant             Men may shave face and neck.   Do not bring valuables to the hospital. [place].  Contacts, dentures or bridgework may not be worn into surgery.  Leave suitcase in the car. After surgery it may be brought to your room.     Patients discharged the day of surgery will not be allowed to drive home. IF YOU ARE HAVING SURGERY AND GOING HOME THE SAME DAY, YOU MUST HAVE AN ADULT TO DRIVE YOU HOME AND BE WITH YOU FOR 24 HOURS. YOU MAY GO HOME BY TAXI OR UBER OR ORTHERWISE, BUT AN ADULT MUST ACCOMPANY YOU HOME AND STAY WITH YOU FOR 24 HOURS.  Name and phone number of your  driver:  Special Instructions: N/A              Please read over the following fact sheets you were given: _____________________________________________________________________         [facility] - Preparing for Surgery Before surgery, you can play an important role.  Because skin is not sterile, your skin needs to be as free of germs as possible.  You can reduce the number of germs on your skin by washing with CHG (chlorahexidine gluconate) soap before surgery.  CHG is an antiseptic cleaner which kills germs and bonds with the skin to continue killing germs even after washing. Please DO NOT use if you have an allergy to CHG or antibacterial soaps.  If your skin becomes reddened/irritated stop using the CHG and inform your nurse when you arrive at Short Stay. Do not shave (including legs and underarms) for at least 48 hours prior to  the first CHG shower.  You may shave your face/neck. Please follow these instructions carefully:  1.  Shower with CHG Soap the night before surgery and the  morning of Surgery.  2.  If you choose to wash your hair, wash your hair first as usual with your  normal  shampoo.  3.  After you shampoo, rinse your hair and body thoroughly to remove the  shampoo.                           4.  Use CHG as you would any other liquid soap.  You can apply chg directly  to the skin and wash                       Gently with a scrungie or clean washcloth.  5.  Apply the CHG Soap to your body ONLY FROM THE NECK DOWN.   Do not use on face/ open                           Wound or open sores. Avoid contact with eyes, ears mouth and genitals (private parts).                       Wash face,  Genitals (private parts) with your normal soap.             6.  Wash thoroughly, paying special attention to the area where your surgery  will be performed.  7.  Thoroughly rinse your body with warm water from the neck down.  8.  DO NOT shower/wash with your normal soap after using and rinsing off   the CHG Soap.                9.  Pat yourself dry with a clean towel.            10.  Wear clean pajamas.            11.  Place clean sheets on your bed the night of your first shower and do not  sleep with pets. Day of Surgery : Do not apply any lotions/deodorants the morning of surgery.  Please wear clean clothes to the hospital/surgery center.  FAILURE TO FOLLOW THESE INSTRUCTIONS MAY RESULT IN THE CANCELLATION OF YOUR SURGERY PATIENT SIGNATURE_________________________________  NURSE SIGNATURE__________________________________  ________________________________________________________________________  Christopher Mosley  An incentive spirometer is a tool that can help keep your lungs clear and active. This tool measures how well you are filling your lungs with each breath. Taking long deep breaths may help reverse or decrease the chance of developing breathing (pulmonary) problems (especially infection) following:  A long period of time when you are unable to move or be active. BEFORE THE PROCEDURE   If the spirometer includes an indicator to show your best effort, your nurse or respiratory therapist will set it to a desired goal.  If possible, sit up straight or lean slightly forward. Try not to slouch.  Hold the incentive spirometer in an upright position. INSTRUCTIONS FOR USE  1. Sit on the edge of your bed if possible, or sit up as far as you can in bed or on a chair. 2. Hold the incentive spirometer in an upright position. 3. Breathe out normally. 4. Place the mouthpiece in your mouth and seal your lips tightly around it. 5. Breathe in slowly and as  deeply as possible, raising the piston or the ball toward the top of the column. 6. Hold your breath for 3-5 seconds or for as long as possible. Allow the piston or ball to fall to the bottom of the column. 7. Remove the mouthpiece from your mouth and breathe out normally. 8. Rest for a few seconds and repeat Steps 1 through 7  at least 10 times every 1-2 hours when you are awake. Take your time and take a few normal breaths between deep breaths. 9. The spirometer may include an indicator to show your best effort. Use the indicator as a goal to work toward during each repetition. 10. After each set of 10 deep breaths, practice coughing to be sure your lungs are clear. If you have an incision (the cut made at the time of surgery), support your incision when coughing by placing a pillow or rolled up towels firmly against it. Once you are able to get out of bed, walk around indoors and cough well. You may stop using the incentive spirometer when instructed by your caregiver.  RISKS AND COMPLICATIONS  Take your time so you do not get dizzy or light-headed.  If you are in pain, you may need to take or ask for pain medication before doing incentive spirometry. It is harder to take a deep breath if you are having pain. AFTER USE  Rest and breathe slowly and easily.  It can be helpful to keep track of a log of your progress. Your caregiver can provide you with a simple table to help with this. If you are using the spirometer at home, follow these instructions: [place] IF:   You are having difficultly using the spirometer.  You have trouble using the spirometer as often as instructed.  Your pain medication is not giving enough relief while using the spirometer.  You develop fever of 100.5 F (38.1 C) or higher. SEEK IMMEDIATE MEDICAL CARE IF:   You cough up bloody sputum that had not been present before.  You develop fever of 102 F (38.9 C) or greater.  You develop worsening pain at or near the incision site. MAKE SURE YOU:   Understand these instructions.  Will watch your condition.  Will get help right away if you are not doing well or get worse. Document Released: 09/05/2006 Document Revised: 07/18/2011 Document Reviewed: 11/06/2006 ExitCare Patient Information 2014 [place].   ________________________________________________________________________  Incentive Spirometer  An incentive spirometer is a tool that can help keep your lungs clear and active. This tool measures how well you are filling your lungs with each breath. Taking long deep breaths may help reverse or decrease the chance of developing breathing (pulmonary) problems (especially infection) following:  A long period of time when you are unable to move or be active. BEFORE THE PROCEDURE   If the spirometer includes an indicator to show your best effort, your nurse or respiratory therapist will set it to a desired goal.  If possible, sit up straight or lean slightly forward. Try not to slouch.  Hold the incentive spirometer in an upright position. INSTRUCTIONS FOR USE  11. Sit on the edge of your bed if possible, or sit up as far as you can in bed or on a chair. 12. Hold the incentive spirometer in an upright position. 13. Breathe out normally. 14. Place the mouthpiece in your mouth and seal your lips tightly around it. 15. Breathe in slowly and as deeply as possible, raising the piston  or the ball toward the top of the column. 16. Hold your breath for 3-5 seconds or for as long as possible. Allow the piston or ball to fall to the bottom of the column. 17. Remove the mouthpiece from your mouth and breathe out normally. 18. Rest for a few seconds and repeat Steps 1 through 7 at least 10 times every 1-2 hours when you are awake. Take your time and take a few normal breaths between deep breaths. 19. The spirometer may include an indicator to show your best effort. Use the indicator as a goal to work toward during each repetition. 20. After each set of 10 deep breaths, practice coughing to be sure your lungs are clear. If you have an incision (the cut made at the time of surgery), support your incision when coughing by placing a pillow or rolled up towels firmly against it. Once you are able to  get out of bed, walk around indoors and cough well. You may stop using the incentive spirometer when instructed by your caregiver.  RISKS AND COMPLICATIONS  Take your time so you do not get dizzy or light-headed.  If you are in pain, you may need to take or ask for pain medication before doing incentive spirometry. It is harder to take a deep breath if you are having pain. AFTER USE  Rest and breathe slowly and easily.  It can be helpful to keep track of a log of your progress. Your caregiver can provide you with a simple table to help with this. If you are using the spirometer at home, follow these instructions: [place] IF:   You are having difficultly using the spirometer.  You have trouble using the spirometer as often as instructed.  Your pain medication is not giving enough relief while using the spirometer.  You develop fever of 100.5 F (38.1 C) or higher. SEEK IMMEDIATE MEDICAL CARE IF:   You cough up bloody sputum that had not been present before.  You develop fever of 102 F (38.9 C) or greater.  You develop worsening pain at or near the incision site. MAKE SURE YOU:   Understand these instructions.  Will watch your condition.  Will get help right away if you are not doing well or get worse. Document Released: 09/05/2006 Document Revised: 07/18/2011 Document Reviewed: 11/06/2006 ExitCare Patient Information 2014 ExitCare, [place].   ________________________________________________________________________  WHAT IS A BLOOD TRANSFUSION? Blood Transfusion Information  A transfusion is the replacement of blood or some of its parts. Blood is made up of multiple cells which provide different functions.  Red blood cells carry oxygen and are used for blood loss replacement.  White blood cells fight against infection.  Platelets control bleeding.  Plasma helps clot blood.  Other blood products are available for specialized needs, such as hemophilia or  other clotting disorders. BEFORE THE TRANSFUSION  Who gives blood for transfusions?   Healthy volunteers who are fully evaluated to make sure their blood is safe. This is blood bank blood. Transfusion therapy is the safest it has ever been in the practice of medicine. Before blood is taken from a donor, a complete history is taken to make sure that person has no history of diseases nor engages in risky social behavior (examples are intravenous drug use or sexual activity with multiple partners). The donor's travel history is screened to minimize risk of transmitting infections, such as malaria. The donated blood is tested for signs of infectious diseases, such as HIV and hepatitis.  The blood is then tested to be sure it is compatible with you in order to minimize the chance of a transfusion reaction. If you or a relative donates blood, this is often done in anticipation of surgery and is not appropriate for emergency situations. It takes many days to process the donated blood. RISKS AND COMPLICATIONS Although transfusion therapy is very safe and saves many lives, the main dangers of transfusion include:   Getting an infectious disease.  Developing a transfusion reaction. This is an allergic reaction to something in the blood you were given. Every precaution is taken to prevent this. The decision to have a blood transfusion has been considered carefully by your caregiver before blood is given. Blood is not given unless the benefits outweigh the risks. AFTER THE TRANSFUSION  Right after receiving a blood transfusion, you will usually feel much better and more energetic. This is especially true if your red blood cells have gotten low (anemic). The transfusion raises the level of the red blood cells which carry oxygen, and this usually causes an energy increase.  The nurse administering the transfusion will monitor you carefully for complications. HOME CARE INSTRUCTIONS  No special instructions are  needed after a transfusion. You may find your energy is better. Speak with your caregiver about any limitations on activity for underlying diseases you may have. SEEK MEDICAL CARE IF:   Your condition is not improving after your transfusion.  You develop redness or irritation at the intravenous (IV) site. SEEK IMMEDIATE MEDICAL CARE IF:  Any of the following symptoms occur over the next 12 hours:  Shaking chills.  You have a temperature by mouth above 102 F (38.9 C), not controlled by medicine.  Chest, back, or muscle pain.  People around you feel you are not acting correctly or are confused.  Shortness of breath or difficulty breathing.  Dizziness and fainting.  You get a rash or develop hives.  You have a decrease in urine output.  Your urine turns a dark color or changes to pink, red, or brown. Any of the following symptoms occur over the next 10 days:  You have a temperature by mouth above 102 F (38.9 C), not controlled by medicine.  Shortness of breath.  Weakness after normal activity.  The white part of the eye turns yellow (jaundice).  You have a decrease in the amount of urine or are urinating less often.  Your urine turns a dark color or changes to pink, red, or brown. Document Released: 04/22/2000 Document Revised: 07/18/2011 Document Reviewed: 12/10/2007 [facility] Patient Information 2014 [place], [place].  _______________________________________________________________________

## 2020-06-16 ENCOUNTER — Encounter (HOSPITAL_COMMUNITY): Payer: Self-pay

## 2020-06-16 ENCOUNTER — Other Ambulatory Visit: Payer: Self-pay

## 2020-06-16 ENCOUNTER — Encounter (HOSPITAL_COMMUNITY)
Admission: RE | Admit: 2020-06-16 | Discharge: 2020-06-16 | Disposition: A | Discharge status: Home / Self Care | Payer: Medicare Other | Source: Ambulatory Visit | Attending: Urology | Admitting: Urology

## 2020-06-16 DIAGNOSIS — Z01812 Encounter for preprocedural laboratory examination: Secondary | ICD-10-CM | POA: Diagnosis not present

## 2020-06-16 HISTORY — DX: Hypothyroidism, unspecified: E03.9

## 2020-06-16 HISTORY — DX: Other specified postprocedural states: Z98.890

## 2020-06-16 HISTORY — DX: Unspecified osteoarthritis, unspecified site: M19.90

## 2020-06-16 HISTORY — DX: Other complications of anesthesia, initial encounter: T88.59XA

## 2020-06-16 HISTORY — DX: Nausea with vomiting, unspecified: R11.2

## 2020-06-16 LAB — BASIC METABOLIC PANEL
Anion gap: 10 (ref 5–15)
BUN: 24 mg/dL — ABNORMAL HIGH (ref 8–23)
CO2: 30 mmol/L (ref 22–32)
Calcium: 9.5 mg/dL (ref 8.9–10.3)
Chloride: 100 mmol/L (ref 98–111)
Creatinine, Ser: 1.2 mg/dL (ref 0.61–1.24)
GFR, Estimated: >60 mL/min (ref >60)
Glucose, Bld: 110 mg/dL — ABNORMAL HIGH (ref 70–99)
Potassium: 4.8 mmol/L (ref 3.5–5.1)
Sodium: 140 mmol/L (ref 135–145)

## 2020-06-16 LAB — CBC
HCT: 49.7 % (ref 39.0–52.0)
Hemoglobin: 16.1 g/dL (ref 13.0–17.0)
MCH: 29.7 pg (ref 26.0–34.0)
MCHC: 32.4 g/dL (ref 30.0–36.0)
MCV: 91.5 fL (ref 80.0–100.0)
Platelets: 221 10*3/uL (ref 150–400)
RBC: 5.43 MIL/uL (ref 4.22–5.81)
RDW: 12.8 % (ref 11.5–15.5)
WBC: 8.2 10*3/uL (ref 4.0–10.5)
nRBC: 0 % (ref 0.0–0.2)

## 2020-06-16 NOTE — Progress Notes (Addendum)
COVID Vaccine Completed: Yes Date COVID Vaccine completed: 01/2020 COVID vaccine manufacturer: Pfizer      PCP - Dr. Cristie Hem Cardiologist - Dr. Zebedee Iba. LOV: 11/14/19  Chest x-ray -  EKG - 11/14/19.: Chart Stress Test -  ECHO -  Cardiac Cath -  Pacemaker/ICD device last checked:  Sleep Study -  CPAP -   Fasting Blood Sugar -  Checks Blood Sugar _____ times a day  Blood Thinner Instructions: Dr. Pearline Cables Aspirin Instructions: Will be on hold starting on: 06/21/19 Last Dose:  Anesthesia review: Hx: HTN,Heart murmur,tetralogy of fallot.  Patient denies shortness of breath, fever, cough and chest pain at PAT appointment   Patient verbalized understanding of instructions that were given to them at the PAT appointment. Patient was also instructed that they will need to review over the PAT instructions again at home before surgery.

## 2020-06-17 NOTE — Progress Notes (Signed)
Anesthesia Chart Review   Case: [id number] Date/Time: 06/26/20 0700   Procedure: XI ROBOTIC ASSITED LAPAROSCOPIC PARTIAL NEPHRECTOMY, POSSIBLE RADICAL NEPHRECTOMY/ POSSIBLE OPEN (Right )   Anesthesia type: General   Pre-op diagnosis: RIGHT RENAL MASS, BLADDER STONE   Location: [facility] ROOM 03 / [facility]   Surgeons: Janith Lima, MD      DISCUSSION:75 y.o. never smoker with h/o PONV, HTN, hypothyroidism, tetralogy of Fallot s/p repair, pulmonic valve replacement, thyroid cancer s/p thyroidectomy and radiation therapy 2002, right renal mass, bladder stone scheduled for above procedure 06/26/20 with Dr. Rexene Alberts.   Pt last seen by cardiology 11/14/2019. Per OV note, "He has clinically done very well and is currently asymptomatic and very active. His echocardiogram today demonstrates a well seated and functioning bioprosthetic pulmonary valve without significant regurgitation or stenosis. His right ventricle is normal sized and functional normally."  1 year follow up recommended.   Anticipate pt can proceed with planned procedure barring acute status change.   VS: BP (!) 166/97   Pulse 67   Temp 36.6 C (Oral)   SpO2 100%   PROVIDERS: Makana Boston, MD is PCP   Jeralyn Bennett, MD is Cardiologist  LABS: Labs reviewed: Acceptable for surgery. (all labs ordered are listed, but only abnormal results are displayed)  Labs Reviewed  BASIC METABOLIC PANEL - Abnormal; Notable for the following components:      Result Value   Glucose, Bld 110 (*)    BUN 24 (*)    All other components within normal limits  CBC  TYPE AND SCREEN     IMAGES:   EKG: On chart  CV: Echo 11/14/2019  INTERPRETATION SUMMARY  Tetralogy of Fallot status-post repair. S/p pulmonary valve replacement with  bioprosthesis.   Normal left ventricular size and systolic functoin  Normal right ventricular size and systolic function  Well seated bioprosthetic pulmonary valve with trivial regurgitation and no  significant  stenosis (peak/mean gradients 17/8 mmHg).  Aortic valve leftlet thickening/sclerosis without stenosis or significant regurgitation.   No residual VSD.   No prior study available for comparison.   Past Medical History:  Diagnosis Date  . Arthritis   . Colon polyps 09/08/2017   q 5 years; need records  . Complication of anesthesia   . Heart murmur   . History of hepatitis B   . Hyperlipemia   . Hypertension   . Hypothyroidism   . Lesion of vocal cord 02/06/2017   Right granuloma; resolved on f/u  . Melanoma ([place])   . Papillary thyroid carcinoma ([facility])    s/p thyroidectomy,kidney,melanomas(2)  . PONV (postoperative nausea and vomiting)   . Urine incontinence     Past Surgical History:  Procedure Laterality Date  . BLADDER SURGERY  2016  . [place]  . COLONOSCOPY     multiple - hx adenomas 2010 and 2015  . MELANOMA EXCISION    . Pottts shunt  1957  . PULMONARY VALVE REPLACEMENT  10/21/2016   pig valve  . TETRALOGY OF FALLOT REPAIR  1961  . TOTAL KNEE ARTHROPLASTY Left 2016  . TOTAL THYROIDECTOMY  2002    MEDICATIONS: . amLODipine (NORVASC) 5 MG tablet  . aspirin EC 81 MG tablet  . cholecalciferol (VITAMIN D) 25 MCG (1000 UNIT) tablet  . Coenzyme Q10 (COQ10) 200 MG CAPS  . levothyroxine (SYNTHROID) 75 MCG tablet  . meloxicam (MOBIC) 15 MG tablet  . methylPREDNISolone (MEDROL DOSEPAK) 4 MG TBPK tablet  . rosuvastatin (CRESTOR) 40  MG tablet  . testosterone cypionate (DEPOTESTOSTERONE CYPIONATE) 200 MG/ML injection   No current facility-administered medications for this encounter.    Konrad Felix, PA-C [facility] [phone]

## 2020-06-23 ENCOUNTER — Other Ambulatory Visit (HOSPITAL_COMMUNITY)
Admission: RE | Admit: 2020-06-23 | Discharge: 2020-06-23 | Disposition: A | Discharge status: Home / Self Care | Payer: Medicare Other | Source: Ambulatory Visit | Attending: Urology | Admitting: Urology

## 2020-06-23 DIAGNOSIS — Z20822 Contact with and (suspected) exposure to covid-19: Secondary | ICD-10-CM | POA: Insufficient documentation

## 2020-06-23 DIAGNOSIS — Z01812 Encounter for preprocedural laboratory examination: Secondary | ICD-10-CM | POA: Insufficient documentation

## 2020-06-23 LAB — SARS CORONAVIRUS 2 (TAT 6-24 HRS): SARS Coronavirus 2: NEGATIVE

## 2020-06-25 NOTE — Anesthesia Preprocedure Evaluation (Addendum)
Anesthesia Evaluation  Patient identified by MRN, date of birth, ID band Patient awake    Reviewed: Allergy & Precautions, H&P , NPO status , Patient's Chart, lab work & pertinent test results  History of Anesthesia Complications (+) PONV  Airway Mallampati: II  TM Distance: >3 FB Neck ROM: Full    Dental no notable dental hx.    Pulmonary neg pulmonary ROS,    Pulmonary exam normal breath sounds clear to auscultation       Cardiovascular hypertension, Normal cardiovascular exam Rhythm:Regular Rate:Normal  S/P TOF repair S/P POTTS   CV: Echo 11/14/2019  INTERPRETATION SUMMARY  Tetralogy of Fallot status-post repair. S/p pulmonary valve replacement with  bioprosthesis.   Normal left ventricular size and systolic functoin  Normal right ventricular size and systolic function  Well seated bioprosthetic pulmonary valve with trivial regurgitation and no significant  stenosis (peak/mean gradients 17/8 mmHg).  Aortic valve leftlet thickening/sclerosis without stenosis or significant regurgitation.   No residual VSD.    Neuro/Psych negative neurological ROS  negative psych ROS   GI/Hepatic negative GI ROS, Neg liver ROS,   Endo/Other  Hypothyroidism   Renal/GU negative Renal ROS  negative genitourinary   Musculoskeletal negative musculoskeletal ROS (+)   Abdominal   Peds negative pediatric ROS (+)  Hematology negative hematology ROS (+)   Anesthesia Other Findings   Reproductive/Obstetrics negative OB ROS                           Anesthesia Physical Anesthesia Plan  ASA: II  Anesthesia Plan: General   Post-op Pain Management:    Induction: Intravenous  PONV Risk Score and Plan: 3 and Ondansetron, Dexamethasone, Droperidol and Treatment may vary due to age or medical condition  Airway Management Planned: Oral ETT  Additional Equipment:   Intra-op Plan:    Post-operative Plan: Extubation in OR  Informed Consent: I have reviewed the patients History and Physical, chart, labs and discussed the procedure including the risks, benefits and alternatives for the proposed anesthesia with the patient or authorized representative who has indicated his/her understanding and acceptance.     Dental advisory given  Plan Discussed with: CRNA and Surgeon  Anesthesia Plan Comments:         Anesthesia Quick Evaluation

## 2020-06-26 ENCOUNTER — Inpatient Hospital Stay (HOSPITAL_COMMUNITY): Payer: Medicare Other | Admitting: Physician Assistant

## 2020-06-26 ENCOUNTER — Inpatient Hospital Stay (HOSPITAL_COMMUNITY): Payer: Medicare Other | Admitting: Registered Nurse

## 2020-06-26 ENCOUNTER — Other Ambulatory Visit: Payer: Self-pay

## 2020-06-26 ENCOUNTER — Encounter (HOSPITAL_COMMUNITY)
Admission: RE | Disposition: A | Discharge status: Home / Self Care | Payer: Self-pay | Source: Home / Self Care | Attending: Urology

## 2020-06-26 ENCOUNTER — Inpatient Hospital Stay (HOSPITAL_COMMUNITY)
Admission: RE | Admit: 2020-06-26 | Discharge: 2020-06-28 | DRG: 658 | Disposition: A | Discharge status: Home / Self Care | Payer: Medicare Other | Attending: Urology | Admitting: Urology

## 2020-06-26 ENCOUNTER — Encounter (HOSPITAL_COMMUNITY): Payer: Self-pay | Admitting: Urology

## 2020-06-26 DIAGNOSIS — Z882 Allergy status to sulfonamides status: Secondary | ICD-10-CM | POA: Diagnosis not present

## 2020-06-26 DIAGNOSIS — Z823 Family history of stroke: Secondary | ICD-10-CM

## 2020-06-26 DIAGNOSIS — Z85820 Personal history of malignant melanoma of skin: Secondary | ICD-10-CM

## 2020-06-26 DIAGNOSIS — Z833 Family history of diabetes mellitus: Secondary | ICD-10-CM | POA: Diagnosis not present

## 2020-06-26 DIAGNOSIS — E785 Hyperlipidemia, unspecified: Secondary | ICD-10-CM | POA: Diagnosis present

## 2020-06-26 DIAGNOSIS — Z8249 Family history of ischemic heart disease and other diseases of the circulatory system: Secondary | ICD-10-CM

## 2020-06-26 DIAGNOSIS — Z825 Family history of asthma and other chronic lower respiratory diseases: Secondary | ICD-10-CM | POA: Diagnosis not present

## 2020-06-26 DIAGNOSIS — E890 Postprocedural hypothyroidism: Secondary | ICD-10-CM | POA: Diagnosis present

## 2020-06-26 DIAGNOSIS — Z8719 Personal history of other diseases of the digestive system: Secondary | ICD-10-CM

## 2020-06-26 DIAGNOSIS — I10 Essential (primary) hypertension: Secondary | ICD-10-CM | POA: Diagnosis present

## 2020-06-26 DIAGNOSIS — Z20822 Contact with and (suspected) exposure to covid-19: Secondary | ICD-10-CM | POA: Diagnosis present

## 2020-06-26 DIAGNOSIS — Z800 Family history of malignant neoplasm of digestive organs: Secondary | ICD-10-CM

## 2020-06-26 DIAGNOSIS — Z83438 Family history of other disorder of lipoprotein metabolism and other lipidemia: Secondary | ICD-10-CM

## 2020-06-26 DIAGNOSIS — Z79890 Hormone replacement therapy (postmenopausal): Secondary | ICD-10-CM

## 2020-06-26 DIAGNOSIS — Z8774 Personal history of (corrected) congenital malformations of heart and circulatory system: Secondary | ICD-10-CM

## 2020-06-26 DIAGNOSIS — N210 Calculus in bladder: Secondary | ICD-10-CM | POA: Diagnosis present

## 2020-06-26 DIAGNOSIS — Z818 Family history of other mental and behavioral disorders: Secondary | ICD-10-CM

## 2020-06-26 DIAGNOSIS — N400 Enlarged prostate without lower urinary tract symptoms: Secondary | ICD-10-CM | POA: Diagnosis present

## 2020-06-26 DIAGNOSIS — C641 Malignant neoplasm of right kidney, except renal pelvis: Principal | ICD-10-CM | POA: Diagnosis present

## 2020-06-26 DIAGNOSIS — Z8261 Family history of arthritis: Secondary | ICD-10-CM

## 2020-06-26 DIAGNOSIS — Z85850 Personal history of malignant neoplasm of thyroid: Secondary | ICD-10-CM

## 2020-06-26 DIAGNOSIS — Z96652 Presence of left artificial knee joint: Secondary | ICD-10-CM | POA: Diagnosis present

## 2020-06-26 DIAGNOSIS — Z811 Family history of alcohol abuse and dependence: Secondary | ICD-10-CM | POA: Diagnosis not present

## 2020-06-26 DIAGNOSIS — Z952 Presence of prosthetic heart valve: Secondary | ICD-10-CM | POA: Diagnosis not present

## 2020-06-26 DIAGNOSIS — E291 Testicular hypofunction: Secondary | ICD-10-CM | POA: Diagnosis present

## 2020-06-26 DIAGNOSIS — N211 Calculus in urethra: Secondary | ICD-10-CM | POA: Diagnosis present

## 2020-06-26 DIAGNOSIS — Z813 Family history of other psychoactive substance abuse and dependence: Secondary | ICD-10-CM

## 2020-06-26 DIAGNOSIS — Z885 Allergy status to narcotic agent status: Secondary | ICD-10-CM | POA: Diagnosis not present

## 2020-06-26 DIAGNOSIS — Z8051 Family history of malignant neoplasm of kidney: Secondary | ICD-10-CM

## 2020-06-26 DIAGNOSIS — N2889 Other specified disorders of kidney and ureter: Secondary | ICD-10-CM | POA: Diagnosis present

## 2020-06-26 HISTORY — PX: CYSTOSCOPY: SHX5120

## 2020-06-26 HISTORY — PX: ROBOTIC ASSITED PARTIAL NEPHRECTOMY: SHX6087

## 2020-06-26 LAB — TYPE AND SCREEN
ABO/RH(D): O POS
Antibody Screen: NEGATIVE

## 2020-06-26 LAB — HEMOGLOBIN AND HEMATOCRIT, BLOOD
HCT: 41.8 % (ref 39.0–52.0)
Hemoglobin: 13.8 g/dL (ref 13.0–17.0)

## 2020-06-26 LAB — ABO/RH: ABO/RH(D): O POS

## 2020-06-26 SURGERY — NEPHRECTOMY, PARTIAL, ROBOT-ASSISTED
Anesthesia: General | Site: Bladder | Laterality: Right

## 2020-06-26 MED ORDER — CHLORHEXIDINE GLUCONATE 0.12 % MT SOLN
15.0000 mL | Freq: Once | OROMUCOSAL | Status: AC
  Administered 2020-06-26: 15 mL via OROMUCOSAL

## 2020-06-26 MED ORDER — ALBUMIN HUMAN 5 % IV SOLN
INTRAVENOUS | Status: AC
  Filled 2020-06-26: qty 250

## 2020-06-26 MED ORDER — LACTATED RINGERS IV SOLN: INTRAVENOUS | Status: DC | PRN

## 2020-06-26 MED ORDER — "VISTASEAL 4 ML SINGLE DOSE KIT "
4.0000 mL | PACK | Freq: Once | CUTANEOUS | Status: DC
  Filled 2020-06-26: qty 4

## 2020-06-26 MED ORDER — STERILE WATER FOR IRRIGATION IR SOLN
Status: DC | PRN
  Administered 2020-06-26: 3000 mL

## 2020-06-26 MED ORDER — SENNOSIDES-DOCUSATE SODIUM 8.6-50 MG PO TABS
2.0000 | ORAL_TABLET | Freq: Every day | ORAL | Status: DC
  Administered 2020-06-26 – 2020-06-27 (×2): 2 via ORAL
  Filled 2020-06-26 (×2): qty 2

## 2020-06-26 MED ORDER — ONDANSETRON HCL 4 MG/2ML IJ SOLN: 4.0000 mg | INTRAMUSCULAR | Status: DC | PRN

## 2020-06-26 MED ORDER — LACTATED RINGERS IR SOLN
Status: DC | PRN
  Administered 2020-06-26: 1000 mL

## 2020-06-26 MED ORDER — HYDROMORPHONE HCL 1 MG/ML IJ SOLN: 0.2500 mg | INTRAMUSCULAR | Status: DC | PRN

## 2020-06-26 MED ORDER — ACETAMINOPHEN 500 MG PO TABS
1000.0000 mg | ORAL_TABLET | Freq: Four times a day (QID) | ORAL | Status: AC
  Administered 2020-06-26 – 2020-06-27 (×4): 1000 mg via ORAL
  Filled 2020-06-26 (×4): qty 2

## 2020-06-26 MED ORDER — ONDANSETRON HCL 4 MG/2ML IJ SOLN
INTRAMUSCULAR | Status: DC | PRN
  Administered 2020-06-26: 4 mg via INTRAVENOUS

## 2020-06-26 MED ORDER — MIDAZOLAM HCL 2 MG/2ML IJ SOLN
INTRAMUSCULAR | Status: AC
  Filled 2020-06-26: qty 2

## 2020-06-26 MED ORDER — ROCURONIUM BROMIDE 10 MG/ML (PF) SYRINGE
PREFILLED_SYRINGE | INTRAVENOUS | Status: DC | PRN
  Administered 2020-06-26: 5 mg via INTRAVENOUS
  Administered 2020-06-26: 80 mg via INTRAVENOUS
  Administered 2020-06-26: 10 mg via INTRAVENOUS

## 2020-06-26 MED ORDER — OXYCODONE HCL 5 MG PO TABS
5.0000 mg | ORAL_TABLET | ORAL | Status: DC | PRN
  Administered 2020-06-26 – 2020-06-28 (×3): 5 mg via ORAL
  Filled 2020-06-26 (×3): qty 1

## 2020-06-26 MED ORDER — GLYCOPYRROLATE PF 0.2 MG/ML IJ SOSY
PREFILLED_SYRINGE | INTRAMUSCULAR | Status: AC
  Filled 2020-06-26: qty 1

## 2020-06-26 MED ORDER — FENTANYL CITRATE (PF) 100 MCG/2ML IJ SOLN: 25.0000 ug | INTRAMUSCULAR | Status: DC | PRN

## 2020-06-26 MED ORDER — DEXAMETHASONE SODIUM PHOSPHATE 10 MG/ML IJ SOLN
INTRAMUSCULAR | Status: DC | PRN
  Administered 2020-06-26: 8 mg via INTRAVENOUS

## 2020-06-26 MED ORDER — ROSUVASTATIN CALCIUM 20 MG PO TABS
40.0000 mg | ORAL_TABLET | Freq: Every day | ORAL | Status: DC
  Administered 2020-06-27: 40 mg via ORAL
  Filled 2020-06-26: qty 2

## 2020-06-26 MED ORDER — PHENYLEPHRINE HCL-NACL 20-0.9 MG/250ML-% IV SOLN
INTRAVENOUS | Status: DC | PRN
  Administered 2020-06-26: 25 ug/min via INTRAVENOUS

## 2020-06-26 MED ORDER — EPHEDRINE SULFATE-NACL 50-0.9 MG/10ML-% IV SOSY
PREFILLED_SYRINGE | INTRAVENOUS | Status: DC | PRN
  Administered 2020-06-26: 5 mg via INTRAVENOUS
  Administered 2020-06-26 (×2): 10 mg via INTRAVENOUS
  Administered 2020-06-26: 5 mg via INTRAVENOUS

## 2020-06-26 MED ORDER — DROPERIDOL 2.5 MG/ML IJ SOLN
INTRAMUSCULAR | Status: DC | PRN
  Administered 2020-06-26: .625 mg via INTRAVENOUS

## 2020-06-26 MED ORDER — DEXAMETHASONE SODIUM PHOSPHATE 10 MG/ML IJ SOLN
INTRAMUSCULAR | Status: AC
  Filled 2020-06-26: qty 1

## 2020-06-26 MED ORDER — LEVOTHYROXINE SODIUM 75 MCG PO TABS
75.0000 ug | ORAL_TABLET | Freq: Every day | ORAL | Status: DC
  Administered 2020-06-27 – 2020-06-28 (×2): 75 ug via ORAL
  Filled 2020-06-26 (×2): qty 1

## 2020-06-26 MED ORDER — ORAL CARE MOUTH RINSE: 15.0000 mL | Freq: Once | OROMUCOSAL | Status: AC

## 2020-06-26 MED ORDER — LIDOCAINE 2% (20 MG/ML) 5 ML SYRINGE
INTRAMUSCULAR | Status: DC | PRN
  Administered 2020-06-26: 1.5 mg/kg/h via INTRAVENOUS

## 2020-06-26 MED ORDER — ALBUMIN HUMAN 5 % IV SOLN: INTRAVENOUS | Status: DC | PRN

## 2020-06-26 MED ORDER — PHENYLEPHRINE HCL (PRESSORS) 10 MG/ML IV SOLN
INTRAVENOUS | Status: AC
  Filled 2020-06-26: qty 2

## 2020-06-26 MED ORDER — DIPHENHYDRAMINE HCL 12.5 MG/5ML PO ELIX: 12.5000 mg | ORAL_SOLUTION | Freq: Four times a day (QID) | ORAL | Status: DC | PRN

## 2020-06-26 MED ORDER — LIDOCAINE HCL 2 % IJ SOLN
INTRAMUSCULAR | Status: AC
  Filled 2020-06-26: qty 20

## 2020-06-26 MED ORDER — LIDOCAINE HCL (CARDIAC) PF 100 MG/5ML IV SOSY
PREFILLED_SYRINGE | INTRAVENOUS | Status: DC | PRN
  Administered 2020-06-26: 100 mg via INTRAVENOUS

## 2020-06-26 MED ORDER — ONDANSETRON HCL 4 MG/2ML IJ SOLN: 4.0000 mg | Freq: Once | INTRAMUSCULAR | Status: DC | PRN

## 2020-06-26 MED ORDER — BACITRACIN-NEOMYCIN-POLYMYXIN 400-5-5000 EX OINT: 1.0000 "application " | TOPICAL_OINTMENT | Freq: Three times a day (TID) | CUTANEOUS | Status: DC | PRN

## 2020-06-26 MED ORDER — LIDOCAINE HCL (PF) 2 % IJ SOLN
INTRAMUSCULAR | Status: AC
  Filled 2020-06-26: qty 5

## 2020-06-26 MED ORDER — DIPHENHYDRAMINE HCL 50 MG/ML IJ SOLN: 12.5000 mg | Freq: Four times a day (QID) | INTRAMUSCULAR | Status: DC | PRN

## 2020-06-26 MED ORDER — ACETAMINOPHEN 10 MG/ML IV SOLN
INTRAVENOUS | Status: AC
  Filled 2020-06-26: qty 100

## 2020-06-26 MED ORDER — ACETAMINOPHEN 10 MG/ML IV SOLN
INTRAVENOUS | Status: DC | PRN
  Administered 2020-06-26: 1000 mg via INTRAVENOUS

## 2020-06-26 MED ORDER — LACTATED RINGERS IV SOLN
INTRAVENOUS | Status: DC
  Administered 2020-06-26: 1000 mL via INTRAVENOUS

## 2020-06-26 MED ORDER — GLYCOPYRROLATE PF 0.2 MG/ML IJ SOSY
PREFILLED_SYRINGE | INTRAMUSCULAR | Status: DC | PRN
  Administered 2020-06-26: .2 mg via INTRAVENOUS

## 2020-06-26 MED ORDER — MIDAZOLAM HCL 5 MG/5ML IJ SOLN
INTRAMUSCULAR | Status: DC | PRN
  Administered 2020-06-26: 2 mg via INTRAVENOUS

## 2020-06-26 MED ORDER — DEXTROSE-NACL 5-0.45 % IV SOLN: INTRAVENOUS | Status: DC

## 2020-06-26 MED ORDER — CEFAZOLIN SODIUM-DEXTROSE 2-4 GM/100ML-% IV SOLN
2.0000 g | Freq: Once | INTRAVENOUS | Status: AC
  Administered 2020-06-26: 2 g via INTRAVENOUS
  Filled 2020-06-26: qty 100

## 2020-06-26 MED ORDER — BUPIVACAINE LIPOSOME 1.3 % IJ SUSP
20.0000 mL | Freq: Once | INTRAMUSCULAR | Status: AC
  Administered 2020-06-26: 20 mL
  Filled 2020-06-26: qty 20

## 2020-06-26 MED ORDER — FENTANYL CITRATE (PF) 100 MCG/2ML IJ SOLN
INTRAMUSCULAR | Status: DC | PRN
  Administered 2020-06-26 (×2): 50 ug via INTRAVENOUS

## 2020-06-26 MED ORDER — ONDANSETRON HCL 4 MG/2ML IJ SOLN
INTRAMUSCULAR | Status: AC
  Filled 2020-06-26: qty 2

## 2020-06-26 MED ORDER — HYDROCODONE-ACETAMINOPHEN 5-325 MG PO TABS: 1.0000 | ORAL_TABLET | Freq: Four times a day (QID) | ORAL | 0 refills | Status: DC | PRN

## 2020-06-26 MED ORDER — AMLODIPINE BESYLATE 5 MG PO TABS
5.0000 mg | ORAL_TABLET | Freq: Every day | ORAL | Status: DC
  Administered 2020-06-27: 5 mg via ORAL
  Filled 2020-06-26: qty 1

## 2020-06-26 MED ORDER — PROPOFOL 10 MG/ML IV BOLUS
INTRAVENOUS | Status: AC
  Filled 2020-06-26: qty 20

## 2020-06-26 MED ORDER — HEMOSTATIC AGENTS (NO CHARGE) OPTIME
TOPICAL | Status: DC | PRN
Start: 2020-06-26 — End: 2020-06-26
  Administered 2020-06-26 (×2): 1

## 2020-06-26 MED ORDER — PROPOFOL 10 MG/ML IV BOLUS
INTRAVENOUS | Status: DC | PRN
  Administered 2020-06-26: 150 mg via INTRAVENOUS

## 2020-06-26 MED ORDER — BELLADONNA ALKALOIDS-OPIUM 16.2-60 MG RE SUPP
1.0000 | Freq: Four times a day (QID) | RECTAL | Status: DC | PRN
Start: 2020-06-26 — End: 2020-06-28

## 2020-06-26 MED ORDER — SUGAMMADEX SODIUM 200 MG/2ML IV SOLN
INTRAVENOUS | Status: DC | PRN
  Administered 2020-06-26: 150 mg via INTRAVENOUS

## 2020-06-26 MED ORDER — SODIUM CHLORIDE (PF) 0.9 % IJ SOLN
INTRAMUSCULAR | Status: AC
  Filled 2020-06-26: qty 20

## 2020-06-26 MED ORDER — EPHEDRINE 5 MG/ML INJ
INTRAVENOUS | Status: AC
  Filled 2020-06-26: qty 10

## 2020-06-26 MED ORDER — SODIUM CHLORIDE (PF) 0.9 % IJ SOLN
INTRAMUSCULAR | Status: DC | PRN
  Administered 2020-06-26: 20 mL

## 2020-06-26 MED ORDER — DROPERIDOL 2.5 MG/ML IJ SOLN
INTRAMUSCULAR | Status: AC
  Filled 2020-06-26: qty 2

## 2020-06-26 MED ORDER — ROCURONIUM BROMIDE 10 MG/ML (PF) SYRINGE
PREFILLED_SYRINGE | INTRAVENOUS | Status: AC
  Filled 2020-06-26: qty 10

## 2020-06-26 MED ORDER — FENTANYL CITRATE (PF) 250 MCG/5ML IJ SOLN
INTRAMUSCULAR | Status: AC
  Filled 2020-06-26: qty 5

## 2020-06-26 SURGICAL SUPPLY — 76 items
APPLICATOR SURGIFLO ENDO (HEMOSTASIS) ×3 IMPLANT
APPLICATOR VISTASEAL 35 (MISCELLANEOUS) IMPLANT
BAG LAPAROSCOPIC 12 15 PORT 16 (BASKET) ×2 IMPLANT
BAG RETRIEVAL 12/15 (BASKET) ×3
BASKET ZERO TIP NITINOL 2.4FR (BASKET) ×3 IMPLANT
CATH FOLEY 2W COUNCIL 5CC 18FR (CATHETERS) ×3 IMPLANT
CHLORAPREP W/TINT 26 (MISCELLANEOUS) ×3 IMPLANT
CLIP SUT LAPRA TY ABSORB (SUTURE) ×9 IMPLANT
CLIP VESOLOCK LG 6/CT PURPLE (CLIP) ×3 IMPLANT
CLIP VESOLOCK MED LG 6/CT (CLIP) ×9 IMPLANT
CLIP VESOLOCK XL 6/CT (CLIP) IMPLANT
COVER SURGICAL LIGHT HANDLE (MISCELLANEOUS) ×3 IMPLANT
COVER TIP SHEARS 8 DVNC (MISCELLANEOUS) ×2 IMPLANT
COVER TIP SHEARS 8MM DA VINCI (MISCELLANEOUS) ×1
COVER WAND RF STERILE (DRAPES) IMPLANT
CUTTER ECHEON FLEX ENDO 45 340 (ENDOMECHANICALS) ×3 IMPLANT
DECANTER SPIKE VIAL GLASS SM (MISCELLANEOUS) ×3 IMPLANT
DERMABOND ADVANCED (GAUZE/BANDAGES/DRESSINGS) ×1
DERMABOND ADVANCED .7 DNX12 (GAUZE/BANDAGES/DRESSINGS) ×2 IMPLANT
DRAIN CHANNEL 15F RND FF 3/16 (WOUND CARE) IMPLANT
DRAPE ARM DVNC X/XI (DISPOSABLE) ×8 IMPLANT
DRAPE COLUMN DVNC XI (DISPOSABLE) ×2 IMPLANT
DRAPE DA VINCI XI ARM (DISPOSABLE) ×4
DRAPE DA VINCI XI COLUMN (DISPOSABLE) ×1
DRAPE INCISE IOBAN 66X45 STRL (DRAPES) ×3 IMPLANT
DRAPE SHEET LG 3/4 BI-LAMINATE (DRAPES) ×9 IMPLANT
ELECT PENCIL ROCKER SW 15FT (MISCELLANEOUS) ×3 IMPLANT
ELECT REM PT RETURN 15FT ADLT (MISCELLANEOUS) ×3 IMPLANT
EVACUATOR SILICONE 100CC (DRAIN) IMPLANT
GLOVE SURG ENC MOIS LTX SZ6.5 (GLOVE) ×3 IMPLANT
GLOVE SURG ENC TEXT LTX SZ7.5 (GLOVE) ×9 IMPLANT
GLOVE SURG UNDER POLY LF SZ7.5 (GLOVE) ×6 IMPLANT
GOWN STRL REUS W/TWL LRG LVL3 (GOWN DISPOSABLE) ×9 IMPLANT
GUIDEWIRE STR DUAL SENSOR (WIRE) ×3 IMPLANT
HEMOSTAT SURGICEL 4X8 (HEMOSTASIS) ×3 IMPLANT
HOLDER FOLEY CATH W/STRAP (MISCELLANEOUS) ×3 IMPLANT
IRRIG SUCT STRYKERFLOW 2 WTIP (MISCELLANEOUS) ×3
IRRIGATION SUCT STRKRFLW 2 WTP (MISCELLANEOUS) ×2 IMPLANT
KIT BASIN OR (CUSTOM PROCEDURE TRAY) ×3 IMPLANT
KIT TURNOVER KIT A (KITS) ×3 IMPLANT
LOOP VESSEL MAXI BLUE (MISCELLANEOUS) ×3 IMPLANT
MARKER SKIN DUAL TIP RULER LAB (MISCELLANEOUS) ×3 IMPLANT
NEEDLE INSUFFLATION 14GA 120MM (NEEDLE) ×3 IMPLANT
POUCH SPECIMEN RETRIEVAL 10MM (ENDOMECHANICALS) IMPLANT
PROTECTOR NERVE ULNAR (MISCELLANEOUS) ×6 IMPLANT
SEAL CANN UNIV 5-8 DVNC XI (MISCELLANEOUS) ×8 IMPLANT
SEAL XI 5MM-8MM UNIVERSAL (MISCELLANEOUS) ×4
SET IRRIG Y TYPE TUR BLADDER L (SET/KITS/TRAYS/PACK) ×3 IMPLANT
SET TUBE SMOKE EVAC HIGH FLOW (TUBING) ×3 IMPLANT
SOLUTION ELECTROLUBE (MISCELLANEOUS) ×3 IMPLANT
STAPLE RELOAD 45 WHT (STAPLE) ×6 IMPLANT
STAPLE RELOAD 45MM WHITE (STAPLE) ×3
SURGIFLO W/THROMBIN 8M KIT (HEMOSTASIS) ×3 IMPLANT
SUT ETHILON 2 0 PSLX (SUTURE) IMPLANT
SUT MNCRL AB 4-0 PS2 18 (SUTURE) ×6 IMPLANT
SUT PDS AB 0 CT1 36 (SUTURE) ×6 IMPLANT
SUT PROLENE 4 0 RB 1 (SUTURE) ×1
SUT PROLENE 4-0 RB1 .5 CRCL 36 (SUTURE) ×2 IMPLANT
SUT VIC AB 0 CT1 27 (SUTURE) ×1
SUT VIC AB 0 CT1 27XBRD ANTBC (SUTURE) ×2 IMPLANT
SUT VIC AB 2-0 SH 27 (SUTURE) ×6
SUT VIC AB 2-0 SH 27X BRD (SUTURE) ×12 IMPLANT
SUT VIC AB 3-0 SH 27 (SUTURE) ×1
SUT VIC AB 3-0 SH 27XBRD (SUTURE) ×2 IMPLANT
SUT VICRYL 0 UR6 27IN ABS (SUTURE) IMPLANT
SUT VLOC BARB 180 ABS3/0GR12 (SUTURE) ×6
SUTURE VLOC BRB 180 ABS3/0GR12 (SUTURE) ×4 IMPLANT
TOWEL OR 17X26 10 PK STRL BLUE (TOWEL DISPOSABLE) ×3 IMPLANT
TOWEL OR NON WOVEN STRL DISP B (DISPOSABLE) ×3 IMPLANT
TRAY FOLEY MTR SLVR 16FR STAT (SET/KITS/TRAYS/PACK) ×3 IMPLANT
TRAY LAPAROSCOPIC (CUSTOM PROCEDURE TRAY) ×3 IMPLANT
TROCAR BLADELESS OPT 5 100 (ENDOMECHANICALS) ×3 IMPLANT
TROCAR UNIVERSAL OPT 12M 100M (ENDOMECHANICALS) IMPLANT
TROCAR XCEL 12X100 BLDLESS (ENDOMECHANICALS) ×3 IMPLANT
TROCAR XCEL NON-BLD 5MMX100MML (ENDOMECHANICALS) ×3 IMPLANT
WATER STERILE IRR 1000ML POUR (IV SOLUTION) ×3 IMPLANT

## 2020-06-26 NOTE — Plan of Care (Signed)
  Problem: Education: °Goal: Knowledge of the prescribed therapeutic regimen will improve °Outcome: Progressing °  °Problem: Bowel/Gastric: °Goal: Gastrointestinal status for postoperative course will improve °Outcome: Progressing °  °Problem: Clinical Measurements: °Goal: Postoperative complications will be avoided or minimized °Outcome: Progressing °  °Problem: Respiratory: °Goal: Ability to achieve and maintain a regular respiratory rate will improve °Outcome: Progressing °  °Problem: Skin Integrity: °Goal: Demonstration of wound healing without infection will improve °Outcome: Progressing °  °Problem: Urinary Elimination: °Goal: Ability to avoid or minimize complications of infection will improve °Outcome: Progressing °Goal: Ability to achieve and maintain urine output will improve °Outcome: Progressing °  °

## 2020-06-26 NOTE — Transfer of Care (Signed)
Immediate Anesthesia Transfer of Care Note  Patient: Christopher Mosley  Procedure(s) Performed: XI ROBOTIC ASSITED LAPAROSCOPIC RADICAL NEPHRECTOMY (Right Abdomen) FLEXIBLE CYSTOSCOPY FOR BLADDER STONE AND PLACEMENT OF FOLEY (N/A Bladder)  Patient Location: PACU  Anesthesia Type:General  Level of Consciousness: awake, alert , oriented and patient cooperative  Airway & Oxygen Therapy: Patient Spontanous Breathing and Patient connected to face mask oxygen  Post-op Assessment: Report given to RN, Post -op Vital signs reviewed and stable and Patient moving all extremities  Post vital signs: Reviewed and stable  Last Vitals:  Vitals Value Taken Time  BP 104/59 06/26/20 1153  Temp    Pulse 64 06/26/20 1154  Resp 15 06/26/20 1154  SpO2 100 % 06/26/20 1154  Vitals shown include unvalidated device data.  Last Pain:  Vitals:   06/26/20 0610  TempSrc:   PainSc: 0-No pain         Complications: No complications documented.

## 2020-06-26 NOTE — Progress Notes (Signed)
Day of Surgery Subjective: Seen in PACU. Pain controlled. No nausea or emesis.   Objective: Vital signs in last 24 hours: Temp:  [97.8 F (36.6 C)-98.1 F (36.7 C)] 97.8 F (36.6 C) (02/18 1330) Pulse Rate:  [64-72] 66 (02/18 1330) Resp:  [13-15] 14 (02/18 1330) BP: (90-160)/(59-92) 97/61 (02/18 1330) SpO2:  [99 %-100 %] 99 % (02/18 1330) Weight:  [70.8 kg] 70.8 kg (02/18 0544)  Intake/Output from previous day: No intake/output data recorded. Intake/Output this shift: Total I/O In: 2965 [I.V.:2515; IV Piggyback:450] Out: 350 [Urine:300; Blood:50]  Urine clear yellow  Physical Exam:  General: Alert and oriented CV: RRR Lungs: Clear Abdomen: Soft, ND, ATTP; inc c/d/I Ext: NT, No erythema  Lab Results: Recent Labs    06/26/20 1213  HGB 13.8  HCT 41.8   BMET No results for input(s): NA, K, CL, CO2, GLUCOSE, BUN, CREATININE, CALCIUM in the last 72 hours.   Studies/Results: No results found.  Assessment/Plan: 1. Right lower pole renal mass 4.5cm s/p robotic assisted laparoscopic right nephrectomy (adrenal sparing), cystoscopy, prostatic urethral stone removal 06/26/2020  -Pain control prn -Clears tonight, regular tomorrow -Labs in AM -Likely home Sunday pending pain control. If stays until Sunday, recommend VT on Sunday given prostatic urethral stone removal and moderate obstructing prostate requiring foley placement over a wire prior to starting case. -Discussed case with patient and his wife.   LOS: 0 days   Matt R. Janei Scheff MD 06/26/2020, 1:34 PM [facility]  Pager: [phone]

## 2020-06-26 NOTE — Op Note (Signed)
Operative Note  Preoperative diagnosis:  1.  Right renal mass  Postoperative diagnosis: 1.  Right renal mass  Procedure(s): 1.  Cystoscopy 2. Removal of prostatic urethral stone 3.  Lysis of adhesions for 30 minutes 4. Intraoperative ultrasound of single retroperitoneal organ 5. Robotic assisted laparoscopic right radical nephrectomy (adrenal sparing)  Surgeon: Rexene Alberts, MD  Assistants:  Debbrah Alar, PA  Anesthesia:  General  Complications:  None  EBL:  37ml  Specimens: 1.  ID Type Source Tests Collected by Time Destination  1 : right kidney Tissue PATH Soft tissue SURGICAL PATHOLOGY Janith Lima, MD 06/26/2020 1118     Drains/Catheters: 1.  16 Fr council foley catheter  Intraoperative findings:   1. Cystoscopy demonstrated approximately 8 mm prostatic urethral stone successfully basket extracted.  There were no suspicious bladder lesions within the bladder.  He had a moderately obstructing prostate. 2. Single right renal artery.  One large right main renal vein and accessory lower pole right renal vein.  No significant lymphadenopathy. 3. Intraoperative ultrasound with intraoperative interpretation demonstrated approximately 4.5 cm right lower pole renal mass with extension towards the renal sinus.  Given that he had a higher risk of morbidities with a heminephrectomy including postoperative bleeding, renal leak and devascularization to the remainder of his kidney,, I elected to perform a right radical nephrectomy with adrenal sparing. 4. Excellent hemostasis.  Indication:  Christopher Mosley is a 75 y.o. male with a 4.3 cm right renal mass in the right lower pole on CT A/P 03/11/2020.  We discussed options including robotic assisted right partial nephrectomy versus robotic assisted right radical nephrectomy.  Given the large size of his mass that would require a heminephrectomy, I discussed the higher chance of radical nephrectomy after visualizing with  intraoperative ultrasound.  He consents to the above procedure.  Description of procedure: The indications, alternatives, benefits and risks were discussed with the patient and informed consent was obtained.  The patient was brought onto the operating room table, positioned supine and secured to the bed with a safety strap.  All pressure points were carefully padded and pneumatic compression devices were placed on the lower extremities.  After the administration of intravenous antibiotics and general endotracheal anesthesia, a flexible cystoscope was used to gain entrance into his urethra.  I encountered approximately 8 mm prostatic urethral stone.  This was grasped using a 0 tip grasper and removed.  I then reintroduced the Foley catheter into the bladder noting a moderately obstructing prostate with moderate hypertrophy.  There are no suspicious bladder lesions.  I then passed a sensor wire into the bladder and passed over this a 18 [place] council Foley catheter.The patient was repositioned in the left lateral decubitus with the right side elevated at a 70 degree angle with the left lower leg flexed and the right upper leg extended.  An axillary roll was positioned to protect the brachial plexus and a gel pad was placed to support the back.  Multiple pillows were used to pad beneath and between the lower extremities and to ensure adequate cushioning. The right arm was placed in an armrest and carefully padded. The patient was secured in place across the hips, chest and legs with foam padding and silk tape, and the table was flexed.  The patient was prepped and draped in the standard sterile manner.  The radiographic images were in the room.  Timeout was completed verifying the correct patient, surgical procedure, site and positioning, prior to beginning the procedure.  Pneumoperitoneum was introduced by placing a Veress needle just lateral to the rectus belly into the abdomen and insufflating with CO2 to a  pressure of 15 mmHg.  The camera trocar was placed approximately 7-1/2 cm inferior and 2 cm medially to the planned position of the right robotic arm which was approximately 2 fingerbreadths inferior to the costal margin.  The 0 degree lens was inserted under direct visualization.  The abdominal cavity was examined for any signs of injury, adhesions and identification of anatomic landmarks. We then placed our right robotic trocar, left robotic trocar and fourth arm trocar.  A 12 mm assistant port was placed supra-umbilically and a 5 mm assistant port was placed just inferior to the xiphoid process to use as a liver retractor.  The robot was then docked.  Of note, he had significant right-sided adhesions with his right sided colon adhered to the peritoneum in both the right lower quadrant and right upper quadrant.  I lysed adhesions for approximately half an hour prior to starting the case.  There was no clear white line of Toldt.  I was able to reflect the colon medially from the right lower quadrant superiorly to the right upper quadrant.  Of note, there is significant adhesions tethering the liver.  These were also taken down.  Hepatic fracture was mobilized. This allowed for further retraction of the liver.  The duodenum was kocherized and the inferior vena cava was visualized.  The retroperitoneal fascia overlying the renal vessels was carefully separated, exposing the underlying renal vein.  I was able to achieve an adequate lift using the fourth arm to elevate the kidney, further exposing the hilum.  The right renal vein and right renal artery were carefully dissected and mobilized.  There is an accessory right lower pole renal vein.  Using Vessel loops, I encircled both the right renal artery and right renal vein.  I then turned my attention towards the mass.  I proceeded to defat nearly the entire kidney.  I then used intraoperative ultrasound and identified the right lower pole mass which is approximately  4.5 cm which is progressing towards the renal sinus fat.  I did score several areas and realized that he would require he nephrectomy and in order to achieve adequate surgical margins.  I felt that with his deep excision towards the collecting system and branching arteries near the hilum, he would be at high risk for bleeding and devascularization to the remaining kidney.  I elected to proceed with a right radical nephrectomy.   A laparoscopic battery-operated stapler with a vascular load was used to control and ligate the right renal artery.  A separate vascular load was used to secure the right renal vein.  A separate vascular load was then used to secure the right lower pole accessory vein.  Next, I mobilized the upper pole and any remaining attachments to the right kidney are cut using bipolar and monopolar cautery.Marland Kitchen Special attention was given to the cranial connections to the adrenal gland, which were carefully divided using the bipolar, completely freeing the adrenal off the superior pole of the kidney.  The right ureter was divided between clips and.   The kidney was placed in an Endo Catch bag.  The renal fossa and remainder of the operating field were inspected for bleeding or injury.  The insufflation pressure was reduced, again confirming the absence of bleeding.  Excellent hemostasis was obtained.  Surgi-Flo and Surgicel were then placed along the hilum and right adrenal  bed.  A mini-Gibson incision was made at the site of the fourth arm trocar.  The fascia was opened and the specimen was removed in a muscle-sparing fashion.  The ports were removed under direct vision.  Carter-Thomason device was used to close the 12 mm port.  All wounds were copiously irrigated and then infiltrated with Exparel.  The muscle was reapproximated using 0 Vicryl as a first layer. 1-0 PDS was used to close the fascia as a second layer.  The skin incisions were reapproximated with 4-0 Monocryl.  Dermabond was then  applied on the skin.  At the end of the procedure, all counts were correct.  The patient tolerated the procedure well and was taken to the recovery room in satisfactory condition.  Plan: Admit overnight with pain control.  Start clears tonight.  Obtain advance to regular diet tomorrow.  He will likely stay until postoperative day 2 or until his pain is well controlled.  Favor voiding trial on postop day 2 given removal of prostatic urethral stone with moderately obstructing prostate.  Matt R. [facility]  Pager: [phone]

## 2020-06-26 NOTE — Anesthesia Postprocedure Evaluation (Signed)
Anesthesia Post Note  Patient: Christopher Mosley  Procedure(s) Performed: XI ROBOTIC ASSITED LAPAROSCOPIC RADICAL NEPHRECTOMY (Right Abdomen) FLEXIBLE CYSTOSCOPY FOR BLADDER STONE AND PLACEMENT OF FOLEY (N/A Bladder)     Patient location during evaluation: PACU Anesthesia Type: General Level of consciousness: awake and alert Pain management: pain level controlled Vital Signs Assessment: post-procedure vital signs reviewed and stable Respiratory status: spontaneous breathing, nonlabored ventilation, respiratory function stable and patient connected to nasal cannula oxygen Cardiovascular status: blood pressure returned to baseline and stable Postop Assessment: no apparent nausea or vomiting Anesthetic complications: no   No complications documented.  Last Vitals:  Vitals:   06/26/20 1415 06/26/20 1515  BP: 98/68 99/72  Pulse: 70 70  Resp: 16 16  Temp: 36.7 C 36.7 C  SpO2: 100% 100%    Last Pain:  Vitals:   06/26/20 1515  TempSrc:   PainSc: 0-No pain                 Estela Vinal S

## 2020-06-26 NOTE — H&P (Signed)
Office Visit Report     06/16/2020   --------------------------------------------------------------------------------   Christopher Mosley  MRN: [id number]  DOB: 06/20/1945, 75 year old Male  SSN:    PRIMARY CARE:  Cristie Hem, MD  REFERRING:  Simone Curia D. Claudia Desanctis, MD  PROVIDER:  Jacalyn Lefevre, M.D.  TREATING:  [place], [place]  LOCATION:  [facility] [phone]     --------------------------------------------------------------------------------   CC/HPI: Pt presents today for pre-operative history and physical exam in anticipation of right robotic assisted lap partial nephrectomy vs radical nephrectomy (possibly open) by Dr. Abner Greenspan on 06/26/20. He is doing well and is without complaint. Pt denies F/C, HA, CP, SOB, N/V, diarrhea/constipation, back pain, flank pain, hematuria, and dysuria.    HX:   Christopher Mosley is a 75 year old male who is referred to discuss a new diagnosis of a right renal mass and definitive treatment options.   He underwent a CT A/P 03/11/2020 which revealed an enhancing 4.3 cm renal cortical mass in the lateral lower right kidney compatible with a clear-cell renal cell carcinoma. There is no evidence of renal vein tumor thrombus. There is no evidence of metastatic disease. He was found to have a tiny right lower lobe pulmonary nodule, measuring 3 mm. He did have numerous Bosniak category 2 renal cysts in both kidneys.   His imaging was done after he had an episode of gross hematuria in October 2021. He denies abdominal pain or flank pain. He does have a history of BPH s/p PVP in 2016 by a urologist in [facility]. He has his PSA checked annually by his PCP and is undergoing routine blood work next week.   He also has a history of hypogonadism and takes testosterone cypionate injections every other week.   Of note, he does have a history of a tetralogy of Fallot requiring multiple procedures as a child. He also underwent a pulmonary  valve replacement in 2018. He also has history of thyroid cancer that is been treated. He has a history of melanoma on his abdomen and left thigh that were excised. He denies taking anticoagulation. He denies pulmonary history. He does have history of hypertension that is well controlled with amlodipine.   ECOG: 0   DX date 03/11/2020     ALLERGIES: codeine - passed out while in hospital Sulfa - Skin Rash    MEDICATIONS: Aspirin 81 mg tablet,chewable  Amlodipine Besylate 5 mg tablet  Atorvastatin Calcium 40 mg tablet  Co Q10  Levothyroxine  Testosterone Cypionate  Vitamin D3     GU PSH: Cystoscopy - 03/26/2020 Locm 300-399Mg /Ml Iodine,1Ml - 03/11/2020       PSH Notes: green light laser surgery, correction of tetralogy of fallot, pulmonary valve replacement, melanoma removal   NON-GU PSH: Knee replacement, Left Replace Pulmonary Valve Thyroid Surgery     GU PMH: BPH w/LUTS - 04/17/2020, Patient had a PVP approximately 5 years ago however does have evidence of prostatic regrowth. He may benefit from a TURP after his surgery for renal mass., - 03/26/2020 Calculus of LUT, Unspec - 04/17/2020, - 03/26/2020 Right renal neoplasm - 04/17/2020 Weak Urinary Stream - 04/17/2020, - 03/26/2020 Gross hematuria, Cystoscopy revealed calculus lodged in prostatic urethra which was pushed back into the bladder with the scope. Patient did have enlarged prostate that limited some of the movement of the scope. Do this size and position of stone I was not able to remove it in the office today. This will likely need to be removed  in the operating room while patient is under anesthesia and can be done at the same time as partial nephrectomy. - 03/26/2020, - 03/11/2020, Discussed possible reasons for gross hematuria including kidney/ureter/bladder/prostate and urethra. Would like to proceed with a gross hematuria workup including CT urogram, cystoscopy and urine cytology., - 09/98/3382 Right uncertain  neoplasm of kidney, Discussed CT findings which reveal a 4.3 cm exophytic enhancing right renal mass concerning for renal cell carcinoma. I discussed that with these findings approximately 90% of the time this will be a cancer of the kidney. We discussed management options including partial nephrectomy, radical nephrectomy and ablation. I have recommended that patient proceed with a robotic assisted laparoscopic partial nephrectomy given the size and location of the mass. Patient be referred to 1 of my partners who performs this procedure. We did briefly discussed postoperative limitations. - 03/26/2020      PMH Notes: bells palsy, thyroid cancer ( s/p removal and radiation), parsonage turner syndrome, hypogonadism, HTN, melanoma   NON-GU PMH: Hepatitis B Malignant melanoma of left lower limb, including hip    FAMILY HISTORY: None   SOCIAL HISTORY: Marital Status: Married Preferred Language: English; Ethnicity: Not Hispanic Or Latino; Race: White Current Smoking Status: Patient has never smoked.   Tobacco Use Assessment Completed: Used Tobacco in last 30 days? Does not use smokeless tobacco. Social Drinker.  Does not use drugs. Drinks 1 caffeinated drink per day. Has had a blood transfusion.     Notes: ETOH rare   REVIEW OF SYSTEMS:    GU Review Male:   Patient denies frequent urination, hard to postpone urination, burning/ pain with urination, get up at night to urinate, leakage of urine, stream starts and stops, trouble starting your stream, have to strain to urinate , erection problems, and penile pain.  Gastrointestinal (Upper):   Patient denies nausea, vomiting, and indigestion/ heartburn.  Gastrointestinal (Lower):   Patient denies diarrhea and constipation.  Constitutional:   Patient denies night sweats, fever, weight loss, and fatigue.  Skin:   Patient denies skin rash/ lesion and itching.  Eyes:   Patient denies blurred vision and double vision.  Ears/ Nose/ Throat:   Patient  denies sore throat and sinus problems.  Hematologic/Lymphatic:   Patient denies swollen glands and easy bruising.  Cardiovascular:   Patient denies leg swelling and chest pains.  Respiratory:   Patient denies cough and shortness of breath.  Endocrine:   Patient denies excessive thirst.  Musculoskeletal:   Patient denies back pain and joint pain.  Neurological:   Patient denies headaches and dizziness.  Psychologic:   Patient denies depression and anxiety.   VITAL SIGNS:      06/16/2020 02:13 PM  Weight 159 lb / 72.12 kg  Height 66 in / 167.64 cm  BP 121/86 mmHg  Pulse 64 /min  Temperature 97.3 F / 36.2 C  BMI 25.7 kg/m   MULTI-SYSTEM PHYSICAL EXAMINATION:    Constitutional: Well-nourished. No physical deformities. Normally developed. Good grooming.  Neck: Neck symmetrical, not swollen. Normal tracheal position.  Respiratory: Normal breath sounds. No labored breathing, no use of accessory muscles.   Cardiovascular: Regular rate and rhythm. No murmur, no gallop.   Lymphatic: No enlargement of neck, axillae, groin.  Skin: No paleness, no jaundice, no cyanosis. No lesion, no ulcer, no rash.  Neurologic / Psychiatric: Oriented to time, oriented to place, oriented to person. No depression, no anxiety, no agitation.  Gastrointestinal: No mass, no tenderness, no rigidity, non obese abdomen.  Eyes: Normal  conjunctivae. Normal eyelids.  Ears, Nose, Mouth, and Throat: Left ear no scars, no lesions, no masses. Right ear no scars, no lesions, no masses. Nose no scars, no lesions, no masses. Normal hearing. Normal lips.  Musculoskeletal: Normal gait and station of head and neck.     Complexity of Data:  Records Review:   Previous Patient Records  Urine Test Review:   Urinalysis   06/16/20  Urinalysis  Urine Appearance Clear   Urine Color Yellow   Urine Glucose Neg mg/dL  Urine Bilirubin Neg mg/dL  Urine Ketones Neg mg/dL  Urine Specific Gravity 1.020   Urine Blood Neg ery/uL  Urine pH  6.0   Urine Protein Trace mg/dL  Urine Urobilinogen 0.2 mg/dL  Urine Nitrites Neg   Urine Leukocyte Esterase Neg leu/uL   PROCEDURES:          Urinalysis - 81003 Dipstick Dipstick Cont'd  Color: Yellow Bilirubin: Neg mg/dL  Appearance: Clear Ketones: Neg mg/dL  Specific Gravity: 1.020 Blood: Neg ery/uL  pH: 6.0 Protein: Trace mg/dL  Glucose: Neg mg/dL Urobilinogen: 0.2 mg/dL    Nitrites: Neg    Leukocyte Esterase: Neg leu/uL    ASSESSMENT:      ICD-10 Details  1 GU:   Right renal neoplasm - D49.511    PLAN:           Schedule Return Visit/Planned Activity: Keep Scheduled Appointment - Schedule Surgery          Document Letter(s):  Created for Patient: Clinical Summary         Notes:   There are no changes in the patients history or physical exam since last evaluation by Dr. Abner Greenspan. Pt is scheduled to undergo RAL (possible open) right partial vs radical nephrectomy on 06/26/20.   All pt's questions were answered to the best of my ability.          Next Appointment:      Next Appointment: 06/26/2020 07:15 AM    Appointment Type: Surgery     Location: [facility] [phone]    Provider: Rexene Alberts, M.D.    Reason for Visit: WL/TBA RA LAP PARTIAL NEPHR/ POS RAD NEPH/ POSS OPEN WITH AMANDA      Signed by Mcarthur Rossetti, PA on 06/16/20 at 2:43 PM (EST)  Urology Preoperative H&P   Chief Complaint: Right renal mass, bladder stone  History of Present Illness: Christopher Mosley is a 75 y.o. male with a right renal mass and bladder stone here for robotic assisted laparoscopic right partial nephrectomy, possible radical, bladder stone removal. Denies fevers, chills, significant pain.    Past Medical History:  Diagnosis Date  . Arthritis   . Colon polyps 09/08/2017   q 5 years; need records  . Complication of anesthesia   . Heart murmur   . History of hepatitis B   . Hyperlipemia   . Hypertension   . Hypothyroidism   . Lesion of vocal  cord 02/06/2017   Right granuloma; resolved on f/u  . Melanoma ([place])   . Papillary thyroid carcinoma ([facility])    s/p thyroidectomy,kidney,melanomas(2)  . PONV (postoperative nausea and vomiting)   . Urine incontinence     Past Surgical History:  Procedure Laterality Date  . BLADDER SURGERY  2016  . [place]  . COLONOSCOPY     multiple - hx adenomas 2010 and 2015  . MELANOMA EXCISION    . Pottts shunt  1957  . PULMONARY VALVE REPLACEMENT  10/21/2016   pig valve  . TETRALOGY OF FALLOT REPAIR  1961  . TOTAL KNEE ARTHROPLASTY Left 2016  . TOTAL THYROIDECTOMY  2002    Allergies:  Allergies  Allergen Reactions  . Codeine Other (See Comments)    Syncope   . Sulfa Antibiotics Diarrhea and Rash    Family History  Problem Relation Age of Onset  . Alcohol abuse Mother   . Diabetes Mother   . Depression Mother   . Early death Mother   . Hyperlipidemia Mother   . Miscarriages / [place] Mother   . Hypertension Mother   . Stomach cancer Mother   . Alcohol abuse Father   . Diabetes Father   . Early death Father   . Heart attack Father   . Heart disease Father   . Hypertension Father   . Pancreatic cancer Father   . Arthritis Brother   . Diabetes Brother   . Stroke Brother   . Renal cancer Brother   . Early death Maternal Grandmother   . Heart attack Maternal Grandmother   . Alcohol abuse Maternal Grandfather   . Heart attack Maternal Grandfather   . Cancer Paternal Grandfather   . Asthma Brother   . Drug abuse Brother   . Healthy Daughter   . Healthy Son     Social History:  reports that he has never smoked. He has never used smokeless tobacco. He reports current alcohol use. He reports that he does not use drugs.  ROS: A complete review of systems was performed.  All systems are negative except for pertinent findings as noted.  Physical Exam:  Vital signs in last 24 hours: Temp:  [98.1 F (36.7 C)] 98.1 F (36.7 C) (02/18 0544) Pulse Rate:   [72] 72 (02/18 0544) Resp:  [15] 15 (02/18 0544) BP: (160)/(92) 160/92 (02/18 0544) SpO2:  [100 %] 100 % (02/18 0544) Weight:  [70.8 kg] 70.8 kg (02/18 0544) Constitutional:  Alert and oriented, No acute distress Cardiovascular: Regular rate and rhythm Respiratory: Normal respiratory effort, Lungs clear bilaterally GI: Abdomen is soft, nontender, nondistended, no abdominal masses GU: No CVA tenderness Lymphatic: No lymphadenopathy Neurologic: Grossly intact, no focal deficits Psychiatric: Normal mood and affect  Laboratory Data:  No results for input(s): WBC, HGB, HCT, PLT in the last 72 hours.  No results for input(s): NA, K, CL, GLUCOSE, BUN, CALCIUM, CREATININE in the last 72 hours.  Invalid input(s): CO3   Results for orders placed or performed during the hospital encounter of 06/26/20 (from the past 24 hour(s))  ABO/Rh     Status: None   Collection Time: 06/26/20  6:20 AM  Result Value Ref Range   ABO/RH(D)      O POS Performed at [facility], [place] [address], [place], [place] [address]    Recent Results (from the past 240 hour(s))  SARS CORONAVIRUS 2 (TAT 6-24 HRS) Nasopharyngeal Nasopharyngeal Swab     Status: None   Collection Time: 06/23/20  1:54 PM   Specimen: Nasopharyngeal Swab  Result Value Ref Range Status   SARS Coronavirus 2 NEGATIVE NEGATIVE Final    Comment: (NOTE) SARS-CoV-2 target nucleic acids are NOT DETECTED.  The SARS-CoV-2 RNA is generally detectable in upper and lower respiratory specimens during the acute phase of infection. Negative results do not preclude SARS-CoV-2 infection, do not rule out co-infections with other pathogens, and should not be used as the sole basis for treatment or other patient management decisions. Negative results must be  combined with clinical observations, patient history, and epidemiological information. The expected result is Negative.  Fact Sheet for  Patients: SugarRoll.be  Fact Sheet for Healthcare Providers: https://www.woods-mathews.com/  This test is not yet approved or cleared by the [place] FDA and  has been authorized for detection and/or diagnosis of SARS-CoV-2 by FDA under an Emergency Use Authorization (EUA). This EUA will remain  in effect (meaning this test can be used) for the duration of the COVID-19 declaration under Se ction 564(b)(1) of the Act, 21 U.S.C. section 360bbb-3(b)(1), unless the authorization is terminated or revoked sooner.  Performed at [facility], [place] [address], [place], [place] [address]     Renal Function: No results for input(s): CREATININE in the last 168 hours. Estimated Creatinine Clearance: 52 mL/min (by C-G formula based on SCr of 1.2 mg/dL).  Radiologic Imaging: No results found.  I independently reviewed the above imaging studies.  Assessment and Plan Christopher Mosley is a 75 y.o. male with a right renal mass and bladder stone here for robotic assisted laparoscopic right partial nephrectomy, possible radical, cystoscopy, bladder stone removal.  We have discussed the risks of treatment in detail including but not limited to bleeding, infection, heart attack, stroke, death, venothromoboembolism, cancer recurrence, injury/damage to surrounding organs and structures, urine leak, the possibility of open surgical conversion for patients undergoing minimally invasive surgery, the risk of developing chronic kidney disease and its associated implications, and the potential risk of end stage renal disease possibly necessitating dialysis.   Christopher R. Vadis Slabach MD 06/26/2020, 7:05 AM  [facility] Pager: [phone]

## 2020-06-26 NOTE — Discharge Instructions (Signed)

## 2020-06-26 NOTE — Anesthesia Procedure Notes (Signed)
Procedure Name: Intubation Date/Time: 06/26/2020 7:36 AM Performed by: Victoriano Lain, CRNA Pre-anesthesia Checklist: Patient identified, Emergency Drugs available, Suction available, Patient being monitored and Timeout performed Patient Re-evaluated:Patient Re-evaluated prior to induction Oxygen Delivery Method: Circle system utilized Preoxygenation: Pre-oxygenation with 100% oxygen Induction Type: IV induction Ventilation: Mask ventilation without difficulty Laryngoscope Size: Mac and 4 Grade View: Grade I Tube size: 7.0 mm Number of attempts: 1 Airway Equipment and Method: Stylet Placement Confirmation: ETT inserted through vocal cords under direct vision,  positive ETCO2 and breath sounds checked- equal and bilateral Secured at: 21 cm Tube secured with: Tape Dental Injury: Teeth and Oropharynx as per pre-operative assessment  Comments: Anterior/BURP pressure applied.

## 2020-06-27 ENCOUNTER — Encounter (HOSPITAL_COMMUNITY): Payer: Self-pay | Admitting: Urology

## 2020-06-27 LAB — BASIC METABOLIC PANEL
Anion gap: 8 (ref 5–15)
BUN: 31 mg/dL — ABNORMAL HIGH (ref 8–23)
CO2: 26 mmol/L (ref 22–32)
Calcium: 8.6 mg/dL — ABNORMAL LOW (ref 8.9–10.3)
Chloride: 102 mmol/L (ref 98–111)
Creatinine, Ser: 1.79 mg/dL — ABNORMAL HIGH (ref 0.61–1.24)
GFR, Estimated: 40 mL/min — ABNORMAL LOW (ref >60)
Glucose, Bld: 181 mg/dL — ABNORMAL HIGH (ref 70–99)
Potassium: 4.6 mmol/L (ref 3.5–5.1)
Sodium: 136 mmol/L (ref 135–145)

## 2020-06-27 LAB — HEMOGLOBIN AND HEMATOCRIT, BLOOD
HCT: 37.1 % — ABNORMAL LOW (ref 39.0–52.0)
Hemoglobin: 12.5 g/dL — ABNORMAL LOW (ref 13.0–17.0)

## 2020-06-27 MED ORDER — CHLORHEXIDINE GLUCONATE CLOTH 2 % EX PADS
6.0000 | MEDICATED_PAD | Freq: Every day | CUTANEOUS | Status: DC
  Administered 2020-06-27: 6 via TOPICAL

## 2020-06-27 NOTE — Progress Notes (Signed)
Urology Inpatient Progress Report  Renal mass [N28.89]  Procedure(s): XI ROBOTIC ASSITED LAPAROSCOPIC RADICAL NEPHRECTOMY FLEXIBLE CYSTOSCOPY FOR BLADDER STONE AND PLACEMENT OF FOLEY  1 Day Post-Op   Intv/Subj: No acute events overnight. Patient is without complaint. Urine clear No nausea  Active Problems:   Renal mass  Current Facility-Administered Medications  Medication Dose Route Frequency Provider Last Rate Last Admin  . acetaminophen (TYLENOL) tablet 1,000 mg  1,000 mg Oral Q6H Dancy, Amanda, PA-C   1,000 mg at 06/27/20 0446  . amLODipine (NORVASC) tablet 5 mg  5 mg Oral Daily Dancy, Amanda, PA-C      . belladonna-opium (B&O) suppository 16.2-60mg   1 suppository Rectal Q6H PRN Dancy, Amanda, PA-C      . dextrose 5 %-0.45 % sodium chloride infusion   Intravenous Continuous Debbrah Alar, PA-C 75 mL/hr at 06/27/20 0448 New Bag at 06/27/20 0448  . diphenhydrAMINE (BENADRYL) injection 12.5 mg  12.5 mg Intravenous Q6H PRN Dancy, Amanda, PA-C       Or  . diphenhydrAMINE (BENADRYL) 12.5 MG/5ML elixir 12.5 mg  12.5 mg Oral Q6H PRN Dancy, Amanda, PA-C      . fentaNYL (SUBLIMAZE) injection 25-50 mcg  25-50 mcg Intravenous Q1H PRN Dancy, Amanda, PA-C      . levothyroxine (SYNTHROID) tablet 75 mcg  75 mcg Oral Q0600 Debbrah Alar, PA-C   75 mcg at 06/27/20 0446  . neomycin-bacitracin-polymyxin (NEOSPORIN) ointment packet 1 application  1 application Topical TID PRN Dancy, Amanda, PA-C      . ondansetron (ZOFRAN) injection 4 mg  4 mg Intravenous Q4H PRN Dancy, Amanda, PA-C      . oxyCODONE (Oxy IR/ROXICODONE) immediate release tablet 5 mg  5 mg Oral Q4H PRN Debbrah Alar, PA-C   5 mg at 06/26/20 1749  . rosuvastatin (CRESTOR) tablet 40 mg  40 mg Oral Daily Dancy, Amanda, PA-C      . senna-docusate (Senokot-S) tablet 2 tablet  2 tablet Oral QHS Debbrah Alar, PA-C   2 tablet at 06/26/20 2037     Objective: Vital: Vitals:   06/26/20 1740 06/26/20 2212 06/27/20 0222 06/27/20 0541   BP: 121/65 109/66 104/65 105/65  Pulse: (!) 59 65 62 60  Resp: 16 16 18 16   Temp: 97.6 F (36.4 C) 98.8 F (37.1 C) 98.2 F (36.8 C) 98 F (36.7 C)  TempSrc:      SpO2: 99% 93% 94% 93%  Weight:      Height:       I/Os: I/O last 3 completed shifts: In: 5038.2 [P.O.:460; I.V.:4128.2; IV Piggyback:450] Out: 950 [Urine:900; Blood:50]  Physical Exam:  General: Patient is in no apparent distress Lungs: Normal respiratory effort, chest expands symmetrically. GI: Incisions are c/d/i. The abdomen is appropriately tender Foley: clear yellow  Ext: lower extremities symmetric  Lab Results: Recent Labs    06/26/20 1213 06/27/20 0505  HGB 13.8 12.5*  HCT 41.8 37.1*   Recent Labs    06/27/20 0505  NA 136  K 4.6  CL 102  CO2 26  GLUCOSE 181*  BUN 31*  CREATININE 1.79*  CALCIUM 8.6*   No results for input(s): LABPT, INR in the last 72 hours. No results for input(s): LABURIN in the last 72 hours. Results for orders placed or performed during the hospital encounter of 06/23/20  SARS CORONAVIRUS 2 (TAT 6-24 HRS) Nasopharyngeal Nasopharyngeal Swab     Status: None   Collection Time: 06/23/20  1:54 PM   Specimen: Nasopharyngeal Swab  Result Value Ref Range  Status   SARS Coronavirus 2 NEGATIVE NEGATIVE Final    Comment: (NOTE) SARS-CoV-2 target nucleic acids are NOT DETECTED.  The SARS-CoV-2 RNA is generally detectable in upper and lower respiratory specimens during the acute phase of infection. Negative results do not preclude SARS-CoV-2 infection, do not rule out co-infections with other pathogens, and should not be used as the sole basis for treatment or other patient management decisions. Negative results must be combined with clinical observations, patient history, and epidemiological information. The expected result is Negative.  Fact Sheet for Patients: SugarRoll.be  Fact Sheet for Healthcare  Providers: https://www.woods-mathews.com/  This test is not yet approved or cleared by the [place] FDA and  has been authorized for detection and/or diagnosis of SARS-CoV-2 by FDA under an Emergency Use Authorization (EUA). This EUA will remain  in effect (meaning this test can be used) for the duration of the COVID-19 declaration under Se ction 564(b)(1) of the Act, 21 U.S.C. section 360bbb-3(b)(1), unless the authorization is terminated or revoked sooner.  Performed at [facility], [place] [address], [place], [place] [address]     Studies/Results: No results found.  Assessment: Procedure(s): XI ROBOTIC ASSITED LAPAROSCOPIC RADICAL NEPHRECTOMY FLEXIBLE CYSTOSCOPY FOR BLADDER STONE AND PLACEMENT OF FOLEY, 1 Day Post-Op  doing well.  Plan: HLIVF Transition to oral medication Advance diet Encourage ambulation Voiding trial on Sunday   Plan to discharge Sunday AM    Louis Meckel, MD Urology 06/27/2020, 9:46 AM

## 2020-06-28 MED ORDER — DOCUSATE SODIUM 100 MG PO CAPS: 100.0000 mg | ORAL_CAPSULE | Freq: Every day | ORAL | 2 refills | Status: DC | PRN

## 2020-06-28 NOTE — Plan of Care (Signed)
  Problem: Education: Goal: Knowledge of the prescribed therapeutic regimen will improve Outcome: Adequate for Discharge   Problem: Bowel/Gastric: Goal: Gastrointestinal status for postoperative course will improve Outcome: Adequate for Discharge   Problem: Clinical Measurements: Goal: Postoperative complications will be avoided or minimized Outcome: Adequate for Discharge   Problem: Respiratory: Goal: Ability to achieve and maintain a regular respiratory rate will improve Outcome: Adequate for Discharge   Problem: Skin Integrity: Goal: Demonstration of wound healing without infection will improve Outcome: Adequate for Discharge   Problem: Urinary Elimination: Goal: Ability to avoid or minimize complications of infection will improve Outcome: Adequate for Discharge Goal: Ability to achieve and maintain urine output will improve Outcome: Adequate for Discharge

## 2020-06-28 NOTE — Progress Notes (Signed)
Patient and his spouse given discharge, follow up, and medication instructions, verbalized understanding, IV x 2 removed, personal belongings with patient, spouse to transport home

## 2020-06-28 NOTE — Discharge Summary (Signed)
Date of admission: 06/26/2020  Date of discharge: 06/28/2020  Admission diagnosis: Right renal mass, bladder calculi  Discharge diagnosis: Same  Secondary diagnoses:  Patient Active Problem List   Diagnosis Date Noted  . Renal mass 06/26/2020  . Erectile dysfunction 10/30/2017  . History of thyroid cancer 09/08/2017  . Mixed hyperlipidemia 09/08/2017  . Essential hypertension 09/08/2017  . Colon polyps 09/08/2017  . Vocal cord dysfunction 03/29/2017  . History of Blalock-Taussig shunt 12/15/2015  . Tetralogy of Fallot s/p repair 12/15/2015  . Hypogonadism in male 08/12/2015  . Postoperative hypothyroidism 08/12/2015    Procedures performed: Procedure(s): XI ROBOTIC ASSITED LAPAROSCOPIC RADICAL NEPHRECTOMY FLEXIBLE CYSTOSCOPY FOR BLADDER STONE AND PLACEMENT OF FOLEY  History and Physical: For full details, please see admission history and physical. Briefly, Christopher Mosley is a 75 y.o. year old patient with gross hematuria who was found to have a small urethral stone and a renal mass.   Hospital Course: Patient tolerated the procedure well.  He was then transferred to the floor after an uneventful PACU stay.  His hospital course was uncomplicated.  On POD#2 he had met discharge criteria: was eating a regular diet, was up and ambulating independently,  pain was well controlled, was voiding without a catheter, and was ready to for discharge.   Physical exam on day of discharge: Vitals:   06/27/20 1357 06/27/20 1825 06/27/20 2038 06/28/20 0458  BP: (!) 164/67 135/77 113/70 (!) 147/79  Pulse: 72 64 96 67  Resp: _0 Temp: 97.8 F (36.6 C) 98.7 F (37.1 C) 98.9 F (37.2 C) 98 F (36.7 C)  TempSrc:   Oral Oral  SpO2:  92% 99% 96%  Weight:      Height:        Intake/Output Summary (Last 24 hours) at 06/28/2020 0730 Last data filed at 06/28/2020 0400 Gross per 24 hour  Intake --  Output 2350 ml  Net -2350 ml   NAD Nonlabored breathing Abdomen is soft,  slightly distended and appropriately tender.  His incisions are clean/dry/intact Extremities are symmetric  Laboratory values:  Recent Labs    06/26/20 1213 06/27/20 0505  HGB 13.8 12.5*  HCT 41.8 37.1*   Recent Labs    06/27/20 0505  NA 136  K 4.6  CL 102  CO2 26  GLUCOSE 181*  BUN 31*  CREATININE 1.79*  CALCIUM 8.6*   No results for input(s): LABPT, INR in the last 72 hours. No results for input(s): LABURIN in the last 72 hours. Results for orders placed or performed during the hospital encounter of 06/23/20  SARS CORONAVIRUS 2 (TAT 6-24 HRS) Nasopharyngeal Nasopharyngeal Swab     Status: None   Collection Time: 06/23/20  1:54 PM   Specimen: Nasopharyngeal Swab  Result Value Ref Range Status   SARS Coronavirus 2 NEGATIVE NEGATIVE Final    Comment: (NOTE) SARS-CoV-2 target nucleic acids are NOT DETECTED.  The SARS-CoV-2 RNA is generally detectable in upper and lower respiratory specimens during the acute phase of infection. Negative results do not preclude SARS-CoV-2 infection, do not rule out co-infections with other pathogens, and should not be used as the sole basis for treatment or other patient management decisions. Negative results must be combined with clinical observations, patient history, and epidemiological information. The expected result is Negative.  Fact Sheet for Patients: SugarRoll.be  Fact Sheet for Healthcare Providers: https://www.woods-mathews.com/  This test is not yet approved or cleared by the [place] and  has been authorized  for detection and/or diagnosis of SARS-CoV-2 by FDA under an Emergency Use Authorization (EUA). This EUA will remain  in effect (meaning this test can be used) for the duration of the COVID-19 declaration under Se ction 564(b)(1) of the Act, 21 U.S.C. section 360bbb-3(b)(1), unless the authorization is terminated or revoked sooner.  Performed at [facility] [place] [address] [place], [place] [address]     Disposition: Home  Discharge instruction: The patient was instructed to be ambulatory but told to refrain from heavy lifting, strenuous activity, or driving.   Discharge medications:  Allergies as of 06/28/2020      Reactions   Codeine Other (See Comments)   Syncope    Sulfa Antibiotics Diarrhea, Rash      Medication List    STOP taking these medications   aspirin EC 81 MG tablet   cholecalciferol 25 MCG (1000 UNIT) tablet Commonly known as: VITAMIN D   CoQ10 200 MG Caps   meloxicam 15 MG tablet Commonly known as: MOBIC     TAKE these medications   amLODipine 5 MG tablet Commonly known as: NORVASC Take 5 mg by mouth daily.   docusate sodium 100 MG capsule Commonly known as: Colace Take 1 capsule (100 mg total) by mouth daily as needed.   HYDROcodone-acetaminophen 5-325 MG tablet Commonly known as: Norco Take 1-2 tablets by mouth every 6 (six) hours as needed for moderate pain.   levothyroxine 75 MCG tablet Commonly known as: SYNTHROID TAKE 1 TABLET BY MOUTH ONCE DAILY BEFORE BREAKFAST   methylPREDNISolone 4 MG Tbpk tablet Commonly known as: MEDROL DOSEPAK 6 day dose pack - take as directed   rosuvastatin 40 MG tablet Commonly known as: CRESTOR Take 40 mg by mouth daily.   testosterone cypionate 200 MG/ML injection Commonly known as: DEPOTESTOSTERONE CYPIONATE INJECT 0.75 ML INTO MUSCLE EVERY 30 DAYS What changed: See the new instructions.       Followup:   Follow-up Information    [facility] On 07/06/2020.   Why: 8AM Contact information: [place] [phone]

## 2020-06-29 LAB — SURGICAL PATHOLOGY

## 2020-10-12 ENCOUNTER — Other Ambulatory Visit (HOSPITAL_COMMUNITY): Payer: Self-pay | Admitting: Urology

## 2020-10-12 DIAGNOSIS — R911 Solitary pulmonary nodule: Secondary | ICD-10-CM

## 2020-11-04 ENCOUNTER — Encounter (HOSPITAL_COMMUNITY)
Admission: RE | Admit: 2020-11-04 | Discharge: 2020-11-04 | Disposition: A | Discharge status: Home / Self Care | Payer: Medicare Other | Source: Ambulatory Visit | Attending: Urology | Admitting: Urology

## 2020-11-04 ENCOUNTER — Other Ambulatory Visit: Payer: Self-pay

## 2020-11-04 DIAGNOSIS — Z905 Acquired absence of kidney: Secondary | ICD-10-CM | POA: Insufficient documentation

## 2020-11-04 DIAGNOSIS — Z8551 Personal history of malignant neoplasm of bladder: Secondary | ICD-10-CM | POA: Diagnosis not present

## 2020-11-04 DIAGNOSIS — R911 Solitary pulmonary nodule: Secondary | ICD-10-CM | POA: Diagnosis not present

## 2020-11-04 LAB — GLUCOSE, CAPILLARY: Glucose-Capillary: 128 mg/dL — ABNORMAL HIGH (ref 70–99)

## 2020-11-04 MED ORDER — FLUDEOXYGLUCOSE F - 18 (FDG) INJECTION
7.0000 | Freq: Once | INTRAVENOUS | Status: AC | PRN
  Administered 2020-11-04: 7.73 via INTRAVENOUS

## 2020-11-12 ENCOUNTER — Telehealth: Payer: Self-pay | Admitting: Oncology

## 2020-11-12 NOTE — Telephone Encounter (Signed)
Received a new pt referral from Christopher Mosley at [facility] for metastatic renal cell carcinoma. Christopher Mosley has been cld and scheduled to see Christopher Mosley on 7/12 at 2pm. Pt aware to arrive 15 minutes early.

## 2020-11-17 ENCOUNTER — Inpatient Hospital Stay: Payer: Medicare Other | Attending: Oncology | Admitting: Oncology

## 2020-11-17 ENCOUNTER — Other Ambulatory Visit: Payer: Self-pay

## 2020-11-17 VITALS — BP 149/80 | HR 65 | Temp 98.1°F | Resp 18 | Wt 157.5 lb

## 2020-11-17 DIAGNOSIS — C649 Malignant neoplasm of unspecified kidney, except renal pelvis: Secondary | ICD-10-CM | POA: Insufficient documentation

## 2020-11-17 DIAGNOSIS — R911 Solitary pulmonary nodule: Secondary | ICD-10-CM

## 2020-11-17 DIAGNOSIS — C641 Malignant neoplasm of right kidney, except renal pelvis: Secondary | ICD-10-CM

## 2020-11-17 NOTE — Progress Notes (Signed)
Reason for the request:    Renal cell carcinoma  HPI: I was asked by Dr. Abner Greenspan to evaluate Christopher Mosley for the evaluation of kidney cancer.  He is a 75 year old man with history of hyperlipidemia, hypertension as well as hypergonadism in addition to papillary thyroid cancer.  He was found to have a 4.3 cm mass in the lateral lower right kidney discovered on a CT scan of the abdomen and pelvis in November 2021.  He was evaluated by Dr. Abner Greenspan and underwent robotic assisted laparoscopic right radical nephrectomy on June 26, 2020.  The final pathology showed clear-cell renal cell carcinoma nuclear grade 2 measuring 4.0 cm with the pathological staging of T1a.  He is remained on active surveillance and underwent imaging studies for surveillance purposes in May 2022.  The CT scan at that time showed numerous bilateral small pulmonary nodules the largest measuring 0.9 x 0.7 cm suspicious for malignancy.  Clinically, he is asymptomatic from these findings.  Denies any respiratory complaints at this time.  He does report some mild cough but no shortness of breath or difficulty breathing.  He denies any history of any lung disease emphysema.  He does have history of [place] that was resected from his left knee and right-sided chest wall respectively.  He also has history of Tetralogy of Fallot that was repaired as a child.   He does not report any headaches, blurry vision, syncope or seizures. Does not report any fevers, chills or sweats.  Does not report any cough, wheezing or hemoptysis.  Does not report any chest pain, palpitation, orthopnea or leg edema.  Does not report any nausea, vomiting or abdominal pain.  Does not report any constipation or diarrhea.  Does not report any skeletal complaints.    Does not report frequency, urgency or hematuria.  Does not report any skin rashes or lesions. Does not report any heat or cold intolerance.  Does not report any lymphadenopathy or petechiae.  Does not  report any anxiety or depression.  Remaining review of systems is negative.     Past Medical History:  Diagnosis Date   Arthritis    Colon polyps 09/08/2017   q 5 years; need records   Complication of anesthesia    Heart murmur    History of hepatitis B    Hyperlipemia    Hypertension    Hypothyroidism    Lesion of vocal cord 02/06/2017   Right granuloma; resolved on f/u   Melanoma ([place])    Papillary thyroid carcinoma ([place])    s/p thyroidectomy,kidney,melanomas(2)   PONV (postoperative nausea and vomiting)    Urine incontinence   :   Past Surgical History:  Procedure Laterality Date   BLADDER SURGERY  2016   [place]   COLONOSCOPY     multiple - hx adenomas 2010 and 2015   CYSTOSCOPY N/A 06/26/2020   Procedure: FLEXIBLE CYSTOSCOPY FOR BLADDER STONE AND PLACEMENT OF FOLEY;  Surgeon: Janith Lima, MD;  Location: [facility];  Service: Urology;  Laterality: N/A;   MELANOMA EXCISION     [place] shunt  1957   PULMONARY VALVE REPLACEMENT  10/21/2016   pig valve   ROBOTIC ASSITED PARTIAL NEPHRECTOMY Right 06/26/2020   Procedure: XI ROBOTIC ASSITED LAPAROSCOPIC RADICAL NEPHRECTOMY;  Surgeon: Janith Lima, MD;  Location: [facility];  Service: Urology;  Laterality: Right;   TETRALOGY OF FALLOT REPAIR  1961   TOTAL KNEE ARTHROPLASTY Left 2016   TOTAL THYROIDECTOMY  2002  :  Current Outpatient Medications:    amLODipine (NORVASC) 5 MG tablet, Take 5 mg by mouth daily., Disp: , Rfl:    docusate sodium (COLACE) 100 MG capsule, Take 1 capsule (100 mg total) by mouth daily as needed., Disp: 30 capsule, Rfl: 2   HYDROcodone-acetaminophen (NORCO) 5-325 MG tablet, Take 1-2 tablets by mouth every 6 (six) hours as needed for moderate pain., Disp: 20 tablet, Rfl: 0   levothyroxine (SYNTHROID) 75 MCG tablet, TAKE 1 TABLET BY MOUTH ONCE DAILY BEFORE BREAKFAST (Patient taking differently: Take 75 mcg by mouth daily before breakfast.), Disp: 90 tablet, Rfl: 3   methylPREDNISolone (MEDROL  DOSEPAK) 4 MG TBPK tablet, 6 day dose pack - take as directed (Patient not taking: Reported on 06/03/2020), Disp: 21 tablet, Rfl: 0   rosuvastatin (CRESTOR) 40 MG tablet, Take 40 mg by mouth daily., Disp: , Rfl:    testosterone cypionate (DEPOTESTOSTERONE CYPIONATE) 200 MG/ML injection, INJECT 0.75 ML INTO MUSCLE EVERY 30 DAYS (Patient taking differently: Inject 150 mg into the muscle every 28 (twenty-eight) days.), Disp: 1 mL, Rfl: 3:   Allergies  Allergen Reactions   Codeine Other (See Comments)    Syncope    Sulfa Antibiotics Diarrhea and Rash  :   Family History  Problem Relation Age of Onset   Alcohol abuse Mother    Diabetes Mother    Depression Mother    Early death Mother    Hyperlipidemia Mother    Miscarriages / [place] Mother    Hypertension Mother    Stomach cancer Mother    Alcohol abuse Father    Diabetes Father    Early death Father    Heart attack Father    Heart disease Father    Hypertension Father    Pancreatic cancer Father    Arthritis Brother    Diabetes Brother    Stroke Brother    Renal cancer Brother    Early death Maternal Grandmother    Heart attack Maternal Grandmother    Alcohol abuse Maternal Grandfather    Heart attack Maternal Grandfather    Cancer Paternal Grandfather    Asthma Brother    Drug abuse Brother    Healthy Daughter    Healthy Son   :   Social History   Socioeconomic History   Marital status: Married    Spouse name: Not on file   Number of children: Not on file   Years of education: Not on file   Highest education level: Not on file  Occupational History   Occupation: Retired Optometrist  Tobacco Use   Smoking status: Never   Smokeless tobacco: Never  Vaping Use   Vaping Use: Never used  Substance and Sexual Activity   Alcohol use: Yes    Comment: occas.   Drug use: Never   Sexual activity: Yes  Other Topics Concern   Not on file  Social History Narrative   Not on file   Social Determinants of Health    Financial Resource Strain: Not on file  Food Insecurity: Not on file  Transportation Needs: Not on file  Physical Activity: Not on file  Stress: Not on file  Social Connections: Not on file  Intimate Partner Violence: Not on file  :  Pertinent items are noted in HPI.  Exam: Blood pressure (!) 149/80, pulse 65, temperature 98.1 F (36.7 C), temperature source Oral, resp. rate 18, weight 157 lb 8 oz (71.4 kg), SpO2 97 %. ECOG 0 General appearance: alert and cooperative appeared without distress.  Head: atraumatic without any abnormalities. Eyes: conjunctivae/corneas clear. PERRL.  Sclera anicteric. Throat: lips, mucosa, and tongue normal; without oral thrush or ulcers. Resp: clear to auscultation bilaterally without rhonchi, wheezes or dullness to percussion. Cardio: regular rate and rhythm, S1, S2 normal, no murmur, click, rub or gallop GI: soft, non-tender; bowel sounds normal; no masses,  no organomegaly Skin: Skin color, texture, turgor normal. No rashes or lesions Lymph nodes: Cervical, supraclavicular, and axillary nodes normal. Neurologic: Grossly normal without any motor, sensory or deep tendon reflexes. Musculoskeletal: No joint deformity or effusion.   NM PET Image Initial (PI) Skull Base To Thigh  Result Date: 11/04/2020 CLINICAL DATA:  Initial treatment strategy for solitary pulmonary nodule. History of melanoma and bladder cancer. Post RIGHT nephrectomy EXAM: NUCLEAR MEDICINE PET SKULL BASE TO THIGH TECHNIQUE: 7.7 mCi F-18 FDG was injected intravenously. Full-ring PET imaging was performed from the skull base to thigh after the radiotracer. CT data was obtained and used for attenuation correction and anatomic localization. Fasting blood glucose: 128 mg/dl COMPARISON:  CT 08/02/2020, 03/11/2020 FINDINGS: Mediastinal blood pool activity: SUV max 2.7 Liver activity: SUV max NA NECK: No hypermetabolic lymph nodes in the neck. Incidental CT findings: none CHEST: Multiple  bilateral round pulmonary nodules. Several larger nodules have mild metabolic activity. For example 9 mm RIGHT lower lobe nodule (image 28/series 5) with SUV max equal 1.1. LEFT upper lobe pulmonary nodule measuring 8 mm (image 13) SUV max equal 2.0. These metabolic activity is less than blood pool activity The pulmonary arteries are markedly enlarged. Incidental CT findings: Post CABG ABDOMEN/PELVIS: Post RIGHT nephrectomy. No nodularity in nephrectomy bed. Single node in the central mesentery adjacent to the superior mesenteric artery measures 8 mm with SUV max equal 3.7 (image 112). No abnormal activity liver. Benign-appearing cyst of the LEFT kidney. Adrenal glands normal. No pelvic lymphadenopathy.  Prostate normal. Incidental CT findings: none SKELETON: No focal hypermetabolic activity to suggest skeletal metastasis. Incidental CT findings: Lucency in the T11 vertebral body is favored hemangioma. IMPRESSION: 1. Bilateral small round pulmonary nodules of varying size. Nodules have faint metabolic activity. Findings are suggestive of metastatic renal cell carcinoma. Melanoma not favored as melanoma is typically intensely hypermetabolic. 2. Post RIGHT nephrectomy without evidence local recurrence. 3. Small mesenteric node in the upper abdomen with moderate metabolic activity is concerning for solitary nodal metastasis. Electronically Signed   By: Suzy Bouchard M.D.   On: 11/04/2020 14:36    Assessment and Plan:    75 year old man with:  1.  Renal cell carcinoma diagnosed in February 2022.  He was found to have 4.0 cm right kidney mass and underwent radical nephrectomy which showed a T1 tumor with clear-cell histology and nuclear grade II.   The natural course of this disease at this time and risk of relapse was discussed.  The treatment options for metastatic renal cell carcinoma was discussed as well if indeed he has stage IV disease.  These options would include immunotherapy, oral targeted therapy  or combination of the above.  Combination immunotherapy could also be a possibility if and when stage IV disease status has been confirmed.   2.  Bilateral pulmonary nodules: These were detected on CT scan in May 2022 confirmed by PET scan on November 04, 2020.  The differential diagnosis was discussed today with the patient.  Imaging studies of both CT scan and PET scan were personally reviewed and reviewed with the patient and his wife.  These nodules are suspicious for metastatic renal cell  carcinoma but other considerations could be a possibility.  Metastatic melanoma likely and other benign etiologies could also be considered.  Management options were reviewed including obtaining tissue biopsy versus a period of observation were reviewed.  Given the likelihood that we are dealing with metastatic renal cell carcinoma at worst and interval scanning and observation would be reasonable to get an idea of the pace of the disease as well as give [place] more options for biopsy if needed.  The plan is to obtain that CT scan chest abdomen and pelvis in 3 months and will assess at that time.   3.  Melanoma: He has history of melanoma of the left knee as well as the right chest wall resected in 1990 in 1994 respectively.  No evidence of disease noted at this time.   4.  Tetralogy of Fallot: Status post repair and status post pulmonary valve replacement in 2018.   5.  Follow-up: We will be in 3 months for repeat evaluation after imaging studies.   60  minutes were dedicated to this visit. The time was spent on reviewing pathology results,, imaging studies, discussing treatment options, discussing differential diagnosis and answering questions regarding future plan.      A copy of this consult has been forwarded to the requesting physician.

## 2020-12-02 ENCOUNTER — Encounter: Payer: Self-pay | Admitting: Oncology

## 2020-12-15 ENCOUNTER — Telehealth: Payer: Self-pay | Admitting: Oncology

## 2020-12-15 NOTE — Telephone Encounter (Signed)
R/s appt per 7/27 sch msg. Pt aware,.

## 2020-12-29 ENCOUNTER — Other Ambulatory Visit: Payer: Self-pay | Admitting: Oncology

## 2020-12-29 DIAGNOSIS — C641 Malignant neoplasm of right kidney, except renal pelvis: Secondary | ICD-10-CM

## 2020-12-30 ENCOUNTER — Other Ambulatory Visit: Payer: Self-pay | Admitting: Oncology

## 2020-12-30 ENCOUNTER — Ambulatory Visit (HOSPITAL_COMMUNITY)
Admission: RE | Admit: 2020-12-30 | Discharge: 2020-12-30 | Disposition: A | Discharge status: Home / Self Care | Payer: Medicare Other | Source: Ambulatory Visit | Attending: Oncology | Admitting: Oncology

## 2020-12-30 ENCOUNTER — Other Ambulatory Visit: Payer: Self-pay

## 2020-12-30 ENCOUNTER — Inpatient Hospital Stay: Payer: Medicare Other | Attending: Oncology

## 2020-12-30 DIAGNOSIS — Z882 Allergy status to sulfonamides status: Secondary | ICD-10-CM | POA: Diagnosis not present

## 2020-12-30 DIAGNOSIS — I712 Thoracic aortic aneurysm, without rupture: Secondary | ICD-10-CM | POA: Insufficient documentation

## 2020-12-30 DIAGNOSIS — Q213 Tetralogy of Fallot: Secondary | ICD-10-CM | POA: Diagnosis not present

## 2020-12-30 DIAGNOSIS — R918 Other nonspecific abnormal finding of lung field: Secondary | ICD-10-CM | POA: Insufficient documentation

## 2020-12-30 DIAGNOSIS — Z885 Allergy status to narcotic agent status: Secondary | ICD-10-CM | POA: Diagnosis not present

## 2020-12-30 DIAGNOSIS — K5730 Diverticulosis of large intestine without perforation or abscess without bleeding: Secondary | ICD-10-CM | POA: Insufficient documentation

## 2020-12-30 DIAGNOSIS — I2510 Atherosclerotic heart disease of native coronary artery without angina pectoris: Secondary | ICD-10-CM | POA: Insufficient documentation

## 2020-12-30 DIAGNOSIS — Z79899 Other long term (current) drug therapy: Secondary | ICD-10-CM | POA: Insufficient documentation

## 2020-12-30 DIAGNOSIS — N200 Calculus of kidney: Secondary | ICD-10-CM | POA: Insufficient documentation

## 2020-12-30 DIAGNOSIS — C641 Malignant neoplasm of right kidney, except renal pelvis: Secondary | ICD-10-CM | POA: Diagnosis not present

## 2020-12-30 DIAGNOSIS — I700 Atherosclerosis of aorta: Secondary | ICD-10-CM | POA: Diagnosis not present

## 2020-12-30 DIAGNOSIS — Z952 Presence of prosthetic heart valve: Secondary | ICD-10-CM | POA: Diagnosis not present

## 2020-12-30 DIAGNOSIS — Z905 Acquired absence of kidney: Secondary | ICD-10-CM | POA: Diagnosis not present

## 2020-12-30 LAB — CBC WITH DIFFERENTIAL (CANCER CENTER ONLY)
Abs Immature Granulocytes: 0.02 10*3/uL (ref 0.00–0.07)
Basophils Absolute: 0.1 10*3/uL (ref 0.0–0.1)
Basophils Relative: 1 %
Eosinophils Absolute: 0.3 10*3/uL (ref 0.0–0.5)
Eosinophils Relative: 4 %
HCT: 44.5 % (ref 39.0–52.0)
Hemoglobin: 14.6 g/dL (ref 13.0–17.0)
Immature Granulocytes: 0 %
Lymphocytes Relative: 39 %
Lymphs Abs: 2.9 10*3/uL (ref 0.7–4.0)
MCH: 28.9 pg (ref 26.0–34.0)
MCHC: 32.8 g/dL (ref 30.0–36.0)
MCV: 87.9 fL (ref 80.0–100.0)
Monocytes Absolute: 0.8 10*3/uL (ref 0.1–1.0)
Monocytes Relative: 11 %
Neutro Abs: 3.3 10*3/uL (ref 1.7–7.7)
Neutrophils Relative %: 45 %
Platelet Count: 216 10*3/uL (ref 150–400)
RBC: 5.06 MIL/uL (ref 4.22–5.81)
RDW: 13.1 % (ref 11.5–15.5)
WBC Count: 7.5 10*3/uL (ref 4.0–10.5)
nRBC: 0 % (ref 0.0–0.2)

## 2020-12-30 LAB — CMP (CANCER CENTER ONLY)
ALT: 16 U/L (ref 0–44)
AST: 24 U/L (ref 15–41)
Albumin: 4.4 g/dL (ref 3.5–5.0)
Alkaline Phosphatase: 57 U/L (ref 38–126)
Anion gap: 11 (ref 5–15)
BUN: 23 mg/dL (ref 8–23)
CO2: 27 mmol/L (ref 22–32)
Calcium: 9.5 mg/dL (ref 8.9–10.3)
Chloride: 104 mmol/L (ref 98–111)
Creatinine: 1.78 mg/dL — ABNORMAL HIGH (ref 0.61–1.24)
GFR, Estimated: 40 mL/min — ABNORMAL LOW (ref >60)
Glucose, Bld: 105 mg/dL — ABNORMAL HIGH (ref 70–99)
Potassium: 4.6 mmol/L (ref 3.5–5.1)
Sodium: 142 mmol/L (ref 135–145)
Total Bilirubin: 0.8 mg/dL (ref 0.3–1.2)
Total Protein: 6.9 g/dL (ref 6.5–8.1)

## 2020-12-31 ENCOUNTER — Inpatient Hospital Stay: Payer: Medicare Other | Admitting: Oncology

## 2020-12-31 VITALS — BP 142/87 | HR 65 | Temp 98.1°F | Resp 18 | Wt 157.2 lb

## 2020-12-31 DIAGNOSIS — C641 Malignant neoplasm of right kidney, except renal pelvis: Secondary | ICD-10-CM | POA: Diagnosis not present

## 2020-12-31 NOTE — Progress Notes (Signed)
Hematology and Oncology Follow Up Visit  Lameek Gower NW:[id number] [birth year] 75 y.o. 12/31/2020 3:36 PM Wile, Jesse Sans, MDWile, Jesse Sans, MD   Principle Diagnosis: Mr. Musa is a 75 year old man with clear-cell renal cell carcinoma diagnosed in February 2022.  He was found to have T1a disease with nuclear grade 2.  He subsequently developed bilateral pulmonary nodules of undetermined etiology in May 2022.   Prior Therapy:    He is robotic assisted laparoscopic right radical nephrectomy on June 26, 2020.  The final pathology showed clear-cell renal cell carcinoma nuclear grade 2 measuring 4.0 cm with the pathological staging of T1a.     Current therapy: Active surveillance.  Interim History: Mr. Lockner returns today for a follow-up visit.  Since the last visit, he reports no major changes in his health.  He denies any recent hospitalizations or illnesses.  He denies any shortness of breath or difficulty breathing.  His performance status quality of life remain excellent.     Medications: I have reviewed the patient's current medications.  Current Outpatient Medications  Medication Sig Dispense Refill   amLODipine (NORVASC) 5 MG tablet Take 5 mg by mouth daily.     levothyroxine (SYNTHROID) 75 MCG tablet TAKE 1 TABLET BY MOUTH ONCE DAILY BEFORE BREAKFAST (Patient taking differently: Take 75 mcg by mouth daily before breakfast.) 90 tablet 3   rosuvastatin (CRESTOR) 20 MG tablet Take 20 mg by mouth at bedtime.     testosterone cypionate (DEPOTESTOSTERONE CYPIONATE) 200 MG/ML injection INJECT 0.75 ML INTO MUSCLE EVERY 30 DAYS (Patient taking differently: Inject 150 mg into the muscle every 28 (twenty-eight) days.) 1 mL 3   No current facility-administered medications for this visit.     Allergies:  Allergies  Allergen Reactions   Codeine Other (See Comments)    Syncope    Sulfa Antibiotics Diarrhea and Rash      Physical Exam: Blood pressure (!) 142/87, pulse 65,  temperature 98.1 F (36.7 C), temperature source Oral, resp. rate 18, weight 157 lb 3.2 oz (71.3 kg), SpO2 98 %.  ECOG:     General appearance: Comfortable appearing without any discomfort Head: Normocephalic without any trauma Oropharynx: Mucous membranes are moist and pink without any thrush or ulcers. Eyes: Pupils are equal and round reactive to light. Lymph nodes: No cervical, supraclavicular, inguinal or axillary lymphadenopathy.   Heart:regular rate and rhythm.  S1 and S2 without leg edema. Lung: Clear without any rhonchi or wheezes.  No dullness to percussion. Abdomin: Soft, nontender, nondistended with good bowel sounds.  No hepatosplenomegaly. Musculoskeletal: No joint deformity or effusion.  Full range of motion noted. Neurological: No deficits noted on motor, sensory and deep tendon reflex exam. Skin: No petechial rash or dryness.  Appeared moist.      Lab Results: Lab Results  Component Value Date   WBC 7.5 12/30/2020   HGB 14.6 12/30/2020   HCT 44.5 12/30/2020   MCV 87.9 12/30/2020   PLT 216 12/30/2020     Chemistry      Component Value Date/Time   NA 142 12/30/2020 0807   K 4.6 12/30/2020 0807   CL 104 12/30/2020 0807   CO2 27 12/30/2020 0807   BUN 23 12/30/2020 0807   CREATININE 1.78 (H) 12/30/2020 0807      Component Value Date/Time   CALCIUM 9.5 12/30/2020 0807   ALKPHOS 57 12/30/2020 0807   AST 24 12/30/2020 0807   ALT 16 12/30/2020 0807   BILITOT 0.8 12/30/2020 [id number]  Result Date: 12/31/2020 CLINICAL DATA:  76 year old male with history of right renal cancer status post nephrectomy. Follow-up study. EXAM: CT CHEST, ABDOMEN AND PELVIS WITHOUT CONTRAST TECHNIQUE: Multidetector CT imaging of the chest, abdomen and pelvis was performed following the standard protocol without IV contrast. COMPARISON:  PET-CT 11/04/2020. FINDINGS: CT CHEST FINDINGS Cardiovascular: Heart size is normal. There is no significant pericardial fluid, thickening or  pericardial calcification. There is aortic atherosclerosis, as well as atherosclerosis of the great vessels of the mediastinum and the coronary arteries, including calcified atherosclerotic plaque in the left main, left anterior descending and right coronary arteries. Calcifications of the aortic valve. Status post pulmonic valve replacement. Pulmonic trunk is dilated measuring 3.8 cm in diameter, and central pulmonary arteries are enlarged, indicative of pulmonary arterial hypertension. Aneurysmal dilatation of the ascending thoracic aorta (4.9 cm in diameter). Mediastinum/Nodes: Status post thyroidectomy. Several prominent but nonenlarged mediastinal lymph nodes, similar to the prior study, nonspecific. No definite pathologically enlarged mediastinal or hilar lymph nodes. Please note that accurate exclusion of hilar adenopathy is limited on noncontrast CT scans. Esophagus is unremarkable in appearance. No axillary lymphadenopathy. Lungs/Pleura: Again noted are multiple small pulmonary nodules scattered throughout the lungs bilaterally, similar in size and number to the prior examination. The largest of these on the right side is in the posterior aspect of the right middle lobe abutting the major fissure (axial image 81 of series 4) measuring 9 x 8 mm. The largest left-sided nodule is in the left upper lobe (axial image 48 of series 4) measuring 9 x 8 mm. Multiple other smaller pulmonary nodules appear similar in size and number to the prior examination. No definite new pulmonary nodules. No acute consolidative airspace disease. No pleural effusions. There is a trace amount of pleural thickening in the right hemithorax, including very small calcified pleural plaques near the base of the right hemithorax, stable compared to prior studies, likely sequela of remote right-sided pleural infection or hemorrhage. Musculoskeletal: Median sternotomy wires. There are no aggressive appearing lytic or blastic lesions noted in  the visualized portions of the skeleton. CT ABDOMEN PELVIS FINDINGS Hepatobiliary: No definite suspicious cystic or solid hepatic lesions are confidently identified on today's noncontrast CT examination. Unenhanced appearance of the gallbladder is normal. Pancreas: No definite pancreatic mass or peripancreatic fluid collections or inflammatory changes are noted on today's noncontrast CT examination. Spleen: Unremarkable. Adrenals/Urinary Tract: Status post right radical nephrectomy. Trace amount of residual postoperative fluid in the nephrectomy bed, decreased compared to the prior study. No definite soft tissue mass in this region to clearly indicate residual or locally recurrent disease. Low-attenuation lesions in the left kidney, incompletely characterized on today's noncontrast CT examination, but similar to the prior study and statistically likely to represent cysts, largest of which is exophytic in the lateral aspect of the lower pole measuring up to 3.3 cm in diameter. 3 mm nonobstructive calculus in the lower pole collecting system of the left kidney. Small cortical calcification in the upper pole of the left kidney. No left hydroureteronephrosis. Unenhanced appearance of the urinary bladder is normal. Bilateral adrenal glands are normal in appearance. Stomach/Bowel: Unenhanced appearance of the stomach is normal. There is no pathologic dilatation of small bowel or colon. Numerous colonic diverticula are noted, particularly in the sigmoid colon, without surrounding inflammatory changes to suggest an acute diverticulitis at this time. Normal appendix. Vascular/Lymphatic: Atherosclerotic calcifications in the abdominal aorta and pelvic vasculature. No lymphadenopathy noted in the abdomen or pelvis. Reproductive: Prostate gland and seminal vesicles  are unremarkable in appearance. Other: No significant volume of ascites.  No pneumoperitoneum. Musculoskeletal: There are no aggressive appearing lytic or blastic  lesions noted in the visualized portions of the skeleton. IMPRESSION: 1. Status post right nephrectomy. Resolving postoperative fluid in the nephrectomy bed. No findings to suggest residual or locally recurrent disease. However, there are multiple persistent small pulmonary nodules which are stable in number and size compared to the prior examination and remain concerning for metastatic disease. 2. No new extra-pulmonary metastatic disease confidently identified elsewhere in the chest, abdomen or pelvis. 3. Status post pulmonic valve replacement. There is dilatation of the pulmonic trunk (3.8 cm in diameter) in the central pulmonary arteries, suggesting pulmonary arterial hypertension. 4. 3 mm nonobstructive calculus in the lower pole collecting system of the left kidney. 5. Colonic diverticulosis without evidence of acute diverticulitis at this time. 6. Aortic atherosclerosis, in addition to left main and 2 vessel coronary artery disease. Assessment for potential risk factor modification, dietary therapy or pharmacologic therapy may be warranted, if clinically indicated. 7. There are calcifications of the aortic valve. Echocardiographic correlation for evaluation of potential valvular dysfunction may be warranted if clinically indicated. 8. Additional incidental findings, as above. Electronically Signed   By: Vinnie Langton M.D.   On: 12/31/2020 05:42     Impression and Plan:  75 year old man with:  1.  T1a renal cell carcinoma diagnosed in February 2022.  He was found to have 4.0 cm with clear-cell histology and nuclear grade II.     Treatment options at this time I would reviewed.  After surgical resection no role for adjuvant therapy given his low-grade and small tumor at this time.  Any systemic therapy will be considered if he developed metastatic disease.     2.  Bilateral pulmonary nodules: CT scan obtained on December 30, 2020 was personally reviewed and showed no increase in the size of these  pulmonary nodules and no abnormalities noted elsewhere.  The differential diagnosis and management options were reviewed again.  Metastatic renal cell carcinoma remains a possibility, benign etiology as well as metastatic malignancy defending kidney could also be considered.  Given the location and the size of these pulmonary nodules, I recommended continued active surveillance.  We will repeat imaging studies in 3 months and consider biopsy if size have changed in the interim.   3.  Melanoma: Status post resection 1994.  No clear-cut evidence of relapse at this time.     4.  Tetralogy of Fallot: He continues to follow with cardiology after pulmonary valve replacement.     5.  Follow-up: In 3 months for repeat evaluation.     30  minutes were spent on this encounter.  Time was educated to reviewing his disease status, discussing differential diagnosis, management choices and future plan of care review.         Zola Button, MD 8/25/20223:36 PM

## 2021-01-11 ENCOUNTER — Other Ambulatory Visit: Payer: Self-pay | Admitting: Family Medicine

## 2021-01-13 ENCOUNTER — Encounter: Payer: Self-pay | Admitting: Oncology

## 2021-01-20 ENCOUNTER — Encounter: Payer: Self-pay | Admitting: *Deleted

## 2021-02-03 ENCOUNTER — Ambulatory Visit: Payer: Medicare Other | Admitting: Oncology

## 2021-03-23 ENCOUNTER — Other Ambulatory Visit: Payer: Medicare Other

## 2021-03-26 ENCOUNTER — Ambulatory Visit: Payer: Medicare Other | Admitting: Oncology

## 2021-04-02 ENCOUNTER — Other Ambulatory Visit: Payer: Self-pay

## 2021-04-02 ENCOUNTER — Inpatient Hospital Stay: Payer: Medicare Other | Attending: Oncology

## 2021-04-02 DIAGNOSIS — C641 Malignant neoplasm of right kidney, except renal pelvis: Secondary | ICD-10-CM | POA: Insufficient documentation

## 2021-04-02 LAB — CMP (CANCER CENTER ONLY)
ALT: 17 U/L (ref 0–44)
AST: 19 U/L (ref 15–41)
Albumin: 4 g/dL (ref 3.5–5.0)
Alkaline Phosphatase: 62 U/L (ref 38–126)
Anion gap: 10 (ref 5–15)
BUN: 28 mg/dL — ABNORMAL HIGH (ref 8–23)
CO2: 28 mmol/L (ref 22–32)
Calcium: 8.9 mg/dL (ref 8.9–10.3)
Chloride: 102 mmol/L (ref 98–111)
Creatinine: 1.81 mg/dL — ABNORMAL HIGH (ref 0.61–1.24)
GFR, Estimated: 39 mL/min — ABNORMAL LOW (ref >60)
Glucose, Bld: 109 mg/dL — ABNORMAL HIGH (ref 70–99)
Potassium: 4.5 mmol/L (ref 3.5–5.1)
Sodium: 140 mmol/L (ref 135–145)
Total Bilirubin: 0.5 mg/dL (ref 0.3–1.2)
Total Protein: 6.2 g/dL — ABNORMAL LOW (ref 6.5–8.1)

## 2021-04-02 LAB — CBC WITH DIFFERENTIAL (CANCER CENTER ONLY)
Abs Immature Granulocytes: 0.03 10*3/uL (ref 0.00–0.07)
Basophils Absolute: 0 10*3/uL (ref 0.0–0.1)
Basophils Relative: 0 %
Eosinophils Absolute: 0.4 10*3/uL (ref 0.0–0.5)
Eosinophils Relative: 4 %
HCT: 42.4 % (ref 39.0–52.0)
Hemoglobin: 13.9 g/dL (ref 13.0–17.0)
Immature Granulocytes: 0 %
Lymphocytes Relative: 38 %
Lymphs Abs: 3.5 10*3/uL (ref 0.7–4.0)
MCH: 29.2 pg (ref 26.0–34.0)
MCHC: 32.8 g/dL (ref 30.0–36.0)
MCV: 89.1 fL (ref 80.0–100.0)
Monocytes Absolute: 1 10*3/uL (ref 0.1–1.0)
Monocytes Relative: 10 %
Neutro Abs: 4.3 10*3/uL (ref 1.7–7.7)
Neutrophils Relative %: 48 %
Platelet Count: 231 10*3/uL (ref 150–400)
RBC: 4.76 MIL/uL (ref 4.22–5.81)
RDW: 13 % (ref 11.5–15.5)
WBC Count: 9.2 10*3/uL (ref 4.0–10.5)
nRBC: 0 % (ref 0.0–0.2)

## 2021-04-05 ENCOUNTER — Encounter (HOSPITAL_COMMUNITY): Payer: Self-pay

## 2021-04-05 ENCOUNTER — Ambulatory Visit (HOSPITAL_COMMUNITY)
Admission: RE | Admit: 2021-04-05 | Discharge: 2021-04-05 | Disposition: A | Discharge status: Home / Self Care | Payer: Medicare Other | Source: Ambulatory Visit | Attending: Oncology | Admitting: Oncology

## 2021-04-05 DIAGNOSIS — C641 Malignant neoplasm of right kidney, except renal pelvis: Secondary | ICD-10-CM | POA: Diagnosis present

## 2021-04-12 ENCOUNTER — Other Ambulatory Visit: Payer: Self-pay

## 2021-04-12 ENCOUNTER — Inpatient Hospital Stay: Payer: Medicare Other | Attending: Oncology | Admitting: Oncology

## 2021-04-12 VITALS — BP 151/78 | HR 66 | Temp 98.1°F | Resp 17 | Ht 67.0 in | Wt 163.4 lb

## 2021-04-12 DIAGNOSIS — Z905 Acquired absence of kidney: Secondary | ICD-10-CM | POA: Insufficient documentation

## 2021-04-12 DIAGNOSIS — Z882 Allergy status to sulfonamides status: Secondary | ICD-10-CM | POA: Insufficient documentation

## 2021-04-12 DIAGNOSIS — Z79899 Other long term (current) drug therapy: Secondary | ICD-10-CM | POA: Insufficient documentation

## 2021-04-12 DIAGNOSIS — Z885 Allergy status to narcotic agent status: Secondary | ICD-10-CM | POA: Diagnosis not present

## 2021-04-12 DIAGNOSIS — C641 Malignant neoplasm of right kidney, except renal pelvis: Secondary | ICD-10-CM | POA: Diagnosis present

## 2021-04-12 DIAGNOSIS — Q213 Tetralogy of Fallot: Secondary | ICD-10-CM | POA: Insufficient documentation

## 2021-04-12 DIAGNOSIS — R918 Other nonspecific abnormal finding of lung field: Secondary | ICD-10-CM | POA: Insufficient documentation

## 2021-04-12 DIAGNOSIS — I700 Atherosclerosis of aorta: Secondary | ICD-10-CM | POA: Diagnosis not present

## 2021-04-12 NOTE — Progress Notes (Signed)
Hematology and Oncology Follow Up Visit  Christopher Mosley [id number] [birth year] 75 y.o. 04/12/2021 9:21 AM Wile, Jesse Sans, MDWile, Jesse Sans, MD   Principle Diagnosis: Christopher Mosley is a 75 year old man with T1a clear-cell renal cell carcinoma diagnosed in February 2022.  He has also pulmonary nodules of unknown etiology.   Prior Therapy:    He is robotic assisted laparoscopic right radical nephrectomy on June 26, 2020.  The final pathology showed clear-cell renal cell carcinoma nuclear grade 2 measuring 4.0 cm with the pathological staging of T1a.     Current therapy: Active surveillance.  Interim History: Christopher Mosley presents today for return evaluation.  Since the last visit, he reports no major changes in his health.  He continues to feel reasonably well.  He denies any chest pain shortness of breath or difficulty breathing.  He denies any flank pain or hematuria.  Formal status quality of life remained excellent.     Medications: Updated on review. Current Outpatient Medications  Medication Sig Dispense Refill   amLODipine (NORVASC) 5 MG tablet Take 5 mg by mouth daily.     levothyroxine (SYNTHROID) 75 MCG tablet TAKE 1 TABLET BY MOUTH ONCE DAILY BEFORE BREAKFAST (Patient taking differently: Take 75 mcg by mouth daily before breakfast.) 90 tablet 3   rosuvastatin (CRESTOR) 20 MG tablet Take 20 mg by mouth at bedtime.     testosterone cypionate (DEPOTESTOSTERONE CYPIONATE) 200 MG/ML injection INJECT 0.75 ML INTO MUSCLE EVERY 30 DAYS (Patient taking differently: Inject 150 mg into the muscle every 28 (twenty-eight) days.) 1 mL 3   No current facility-administered medications for this visit.     Allergies:  Allergies  Allergen Reactions   Codeine Other (See Comments)    Syncope    Sulfa Antibiotics Diarrhea and Rash      Physical Exam: Blood pressure (!) 151/78, pulse 66, temperature 98.1 F (36.7 C), temperature source Temporal, resp. rate 17, height 5\' 7"  (1.702  m), weight 163 lb 6.4 oz (74.1 kg), SpO2 100 %.   ECOG:    General appearance: Alert, awake without any distress. Head: Atraumatic without abnormalities Oropharynx: Without any thrush or ulcers. Eyes: No scleral icterus. Lymph nodes: No lymphadenopathy noted in the cervical, supraclavicular, or axillary nodes Heart:regular rate and rhythm, without any murmurs or gallops.   Lung: Clear to auscultation without any rhonchi, wheezes or dullness to percussion. Abdomin: Soft, nontender without any shifting dullness or ascites. Musculoskeletal: No clubbing or cyanosis. Neurological: No motor or sensory deficits. Skin: No rashes or lesions.      Lab Results: Lab Results  Component Value Date   WBC 9.2 04/02/2021   HGB 13.9 04/02/2021   HCT 42.4 04/02/2021   MCV 89.1 04/02/2021   PLT 231 04/02/2021     Chemistry      Component Value Date/Time   NA 140 04/02/2021 0712   K 4.5 04/02/2021 0712   CL 102 04/02/2021 0712   CO2 28 04/02/2021 0712   BUN 28 (H) 04/02/2021 0712   CREATININE 1.81 (H) 04/02/2021 0712      Component Value Date/Time   CALCIUM 8.9 04/02/2021 0712   ALKPHOS 62 04/02/2021 0712   AST 19 04/02/2021 0712   ALT 17 04/02/2021 0712   BILITOT 0.5 04/02/2021 0712      IMPRESSION: 1. Status post right nephrectomy with decreased soft tissue seen in the nephrectomy bed, compatible with resolving postsurgical change. No evidence of recurrent disease. 2. Stable solid bilateral pulmonary nodules. 3. No evidence of  metastatic disease in the abdomen or pelvis. 4.  Aortic Atherosclerosis (ICD10-I70.0).      Impression and Plan:  75 year old man with:  1.  Kidney cancer diagnosed in February 2022.  He was found to have T1a clear-cell subtype.   He is currently on active surveillance without any need for intervention.  CT scan on April 05, 2021 does not show any evidence of clear metastasis.  He is at a candidate for adjuvant immunotherapy at this time.   Systemic therapy will be only used if he has documented metastatic disease.     2.  Bilateral pulmonary nodules: Etiology remains unclear and appears to be benign but could also be an indolent malignancy including renal cell carcinoma.  CT scan obtained on 04/05/2021 was personally reviewed and showed overall stability of these nodules.  At this time I recommended continued active surveillance and will repeat imaging studies in 6 months.     3.  Melanoma: No recent recurrence at this time.  We will continue active surveillance for the time being.     4.  Tetralogy of Fallot: No recent exacerbation or evidence of heart failure.  Continues to follow with cardiology.     5.  Follow-up: Will be in 5 months for repeat evaluation.     30  minutes were spent on this visit.  The time was dedicated to reviewing laboratory data, disease status update, reviewing imaging studies and outlining future plan of care.         Zola Button, MD 12/5/20229:21 AM

## 2021-06-04 ENCOUNTER — Telehealth: Payer: Self-pay | Admitting: Oncology

## 2021-06-04 NOTE — Telephone Encounter (Signed)
Rescheduled May appointments, patient has been called and notified of new upcoming appointment.

## 2021-07-14 ENCOUNTER — Telehealth: Payer: Self-pay | Admitting: Oncology

## 2021-07-14 NOTE — Telephone Encounter (Signed)
Called patient regarding upcoming May appointments, left a voicemail. 

## 2021-09-07 ENCOUNTER — Other Ambulatory Visit: Payer: Medicare Other

## 2021-09-08 ENCOUNTER — Ambulatory Visit (HOSPITAL_COMMUNITY)
Admission: RE | Admit: 2021-09-08 | Discharge: 2021-09-08 | Disposition: A | Discharge status: Home / Self Care | Payer: Medicare Other | Source: Ambulatory Visit | Attending: Oncology | Admitting: Oncology

## 2021-09-08 ENCOUNTER — Other Ambulatory Visit: Payer: Self-pay

## 2021-09-08 ENCOUNTER — Ambulatory Visit (HOSPITAL_COMMUNITY): Payer: Medicare Other

## 2021-09-08 ENCOUNTER — Inpatient Hospital Stay: Payer: Medicare Other | Attending: Oncology

## 2021-09-08 DIAGNOSIS — I7121 Aneurysm of the ascending aorta, without rupture: Secondary | ICD-10-CM | POA: Insufficient documentation

## 2021-09-08 DIAGNOSIS — Q213 Tetralogy of Fallot: Secondary | ICD-10-CM | POA: Insufficient documentation

## 2021-09-08 DIAGNOSIS — Z905 Acquired absence of kidney: Secondary | ICD-10-CM | POA: Insufficient documentation

## 2021-09-08 DIAGNOSIS — K5730 Diverticulosis of large intestine without perforation or abscess without bleeding: Secondary | ICD-10-CM | POA: Insufficient documentation

## 2021-09-08 DIAGNOSIS — C641 Malignant neoplasm of right kidney, except renal pelvis: Secondary | ICD-10-CM | POA: Insufficient documentation

## 2021-09-08 DIAGNOSIS — Z79899 Other long term (current) drug therapy: Secondary | ICD-10-CM | POA: Insufficient documentation

## 2021-09-08 DIAGNOSIS — Z882 Allergy status to sulfonamides status: Secondary | ICD-10-CM | POA: Insufficient documentation

## 2021-09-08 DIAGNOSIS — I700 Atherosclerosis of aorta: Secondary | ICD-10-CM | POA: Insufficient documentation

## 2021-09-08 DIAGNOSIS — Z885 Allergy status to narcotic agent status: Secondary | ICD-10-CM | POA: Insufficient documentation

## 2021-09-08 DIAGNOSIS — R911 Solitary pulmonary nodule: Secondary | ICD-10-CM | POA: Insufficient documentation

## 2021-09-08 DIAGNOSIS — C439 Malignant melanoma of skin, unspecified: Secondary | ICD-10-CM | POA: Insufficient documentation

## 2021-09-08 LAB — CBC WITH DIFFERENTIAL (CANCER CENTER ONLY)
Abs Immature Granulocytes: 0.02 10*3/uL (ref 0.00–0.07)
Basophils Absolute: 0 10*3/uL (ref 0.0–0.1)
Basophils Relative: 0 %
Eosinophils Absolute: 0.4 10*3/uL (ref 0.0–0.5)
Eosinophils Relative: 4 %
HCT: 43.4 % (ref 39.0–52.0)
Hemoglobin: 13.8 g/dL (ref 13.0–17.0)
Immature Granulocytes: 0 %
Lymphocytes Relative: 35 %
Lymphs Abs: 4.1 10*3/uL — ABNORMAL HIGH (ref 0.7–4.0)
MCH: 28.4 pg (ref 26.0–34.0)
MCHC: 31.8 g/dL (ref 30.0–36.0)
MCV: 89.3 fL (ref 80.0–100.0)
Monocytes Absolute: 1.1 10*3/uL — ABNORMAL HIGH (ref 0.1–1.0)
Monocytes Relative: 9 %
Neutro Abs: 5.9 10*3/uL (ref 1.7–7.7)
Neutrophils Relative %: 52 %
Platelet Count: 259 10*3/uL (ref 150–400)
RBC: 4.86 MIL/uL (ref 4.22–5.81)
RDW: 14 % (ref 11.5–15.5)
WBC Count: 11.5 10*3/uL — ABNORMAL HIGH (ref 4.0–10.5)
nRBC: 0 % (ref 0.0–0.2)

## 2021-09-08 LAB — CMP (CANCER CENTER ONLY)
ALT: 11 U/L (ref 0–44)
AST: 20 U/L (ref 15–41)
Albumin: 4.6 g/dL (ref 3.5–5.0)
Alkaline Phosphatase: 57 U/L (ref 38–126)
Anion gap: 7 (ref 5–15)
BUN: 22 mg/dL (ref 8–23)
CO2: 29 mmol/L (ref 22–32)
Calcium: 9.4 mg/dL (ref 8.9–10.3)
Chloride: 101 mmol/L (ref 98–111)
Creatinine: 1.69 mg/dL — ABNORMAL HIGH (ref 0.61–1.24)
GFR, Estimated: 42 mL/min — ABNORMAL LOW (ref >60)
Glucose, Bld: 106 mg/dL — ABNORMAL HIGH (ref 70–99)
Potassium: 4.7 mmol/L (ref 3.5–5.1)
Sodium: 137 mmol/L (ref 135–145)
Total Bilirubin: 0.7 mg/dL (ref 0.3–1.2)
Total Protein: 7 g/dL (ref 6.5–8.1)

## 2021-09-14 ENCOUNTER — Ambulatory Visit: Payer: Medicare Other | Admitting: Oncology

## 2021-09-14 ENCOUNTER — Other Ambulatory Visit: Payer: Self-pay

## 2021-09-14 ENCOUNTER — Inpatient Hospital Stay: Payer: Medicare Other | Admitting: Oncology

## 2021-09-14 VITALS — BP 148/93 | HR 72 | Temp 97.9°F | Resp 17 | Ht 67.0 in | Wt 166.1 lb

## 2021-09-14 DIAGNOSIS — R911 Solitary pulmonary nodule: Secondary | ICD-10-CM | POA: Diagnosis not present

## 2021-09-14 DIAGNOSIS — I7121 Aneurysm of the ascending aorta, without rupture: Secondary | ICD-10-CM | POA: Diagnosis not present

## 2021-09-14 DIAGNOSIS — I700 Atherosclerosis of aorta: Secondary | ICD-10-CM | POA: Diagnosis not present

## 2021-09-14 DIAGNOSIS — Z905 Acquired absence of kidney: Secondary | ICD-10-CM | POA: Diagnosis not present

## 2021-09-14 DIAGNOSIS — C439 Malignant melanoma of skin, unspecified: Secondary | ICD-10-CM | POA: Diagnosis not present

## 2021-09-14 DIAGNOSIS — Z79899 Other long term (current) drug therapy: Secondary | ICD-10-CM | POA: Diagnosis not present

## 2021-09-14 DIAGNOSIS — C641 Malignant neoplasm of right kidney, except renal pelvis: Secondary | ICD-10-CM

## 2021-09-14 DIAGNOSIS — Z882 Allergy status to sulfonamides status: Secondary | ICD-10-CM | POA: Diagnosis not present

## 2021-09-14 DIAGNOSIS — K5730 Diverticulosis of large intestine without perforation or abscess without bleeding: Secondary | ICD-10-CM | POA: Diagnosis not present

## 2021-09-14 DIAGNOSIS — Q213 Tetralogy of Fallot: Secondary | ICD-10-CM | POA: Diagnosis not present

## 2021-09-14 DIAGNOSIS — Z885 Allergy status to narcotic agent status: Secondary | ICD-10-CM | POA: Diagnosis not present

## 2021-09-14 NOTE — Progress Notes (Signed)
Hematology and Oncology Follow Up Visit ? ?Alexandre Faries ?[id number] ?[birth year] 76 y.o. ?09/14/2021 8:49 AM ?Julion Boston, MDWile, Jesse Sans, MD  ? ?Principle Diagnosis: 76 year old man with kidney cancer diagnosed in February 2022.  He was found to have T1a clear-cell with pulmonary nodules.  Etiologies pulmonary nodules appear to be benign. ? ? ?Prior Therapy:  ? ? ?He is robotic assisted laparoscopic right radical nephrectomy on June 26, 2020.  The final pathology showed clear-cell renal cell carcinoma nuclear grade 2 measuring 4.0 cm with the pathological staging of T1a.  ? ? ? ?Current therapy: Active surveillance. ? ?Interim History: Mr. Corkum is here for a follow-up visit.  Since last visit, no major changes in his health.  He denies any recent hospitalizations or illnesses.  He denies any chest pain or shortness of breath.  He denies any palpitation or excessive fatigue.  His performance status quality of life remains reasonable. ? ? ? ? ?Medications: Reviewed without changes. ?Current Outpatient Medications  ?Medication Sig Dispense Refill  ? amLODipine (NORVASC) 5 MG tablet Take 5 mg by mouth daily.    ? levothyroxine (SYNTHROID) 75 MCG tablet TAKE 1 TABLET BY MOUTH ONCE DAILY BEFORE BREAKFAST (Patient taking differently: Take 75 mcg by mouth daily before breakfast.) 90 tablet 3  ? rosuvastatin (CRESTOR) 20 MG tablet Take 20 mg by mouth at bedtime.    ? testosterone cypionate (DEPOTESTOSTERONE CYPIONATE) 200 MG/ML injection INJECT 0.75 ML INTO MUSCLE EVERY 30 DAYS (Patient taking differently: Inject 150 mg into the muscle every 28 (twenty-eight) days.) 1 mL 3  ? ?No current facility-administered medications for this visit.  ? ? ? ?Allergies:  ?Allergies  ?Allergen Reactions  ? Codeine Other (See Comments)  ?  Syncope   ? Sulfa Antibiotics Diarrhea and Rash  ? ? ? ? ?Physical Exam: ? ?Blood pressure (!) 148/93, pulse 72, temperature 97.9 ?F (36.6 ?C), temperature source Temporal, resp. rate 17,  height '5\' 7"'$  (1.702 m), weight 166 lb 1.6 oz (75.3 kg), SpO2 100 %. ? ? ?ECOG: 1 ? ? ?General appearance: Comfortable appearing without any discomfort ?Head: Normocephalic without any trauma ?Oropharynx: Mucous membranes are moist and pink without any thrush or ulcers. ?Eyes: Pupils are equal and round reactive to light. ?Lymph nodes: No cervical, supraclavicular, inguinal or axillary lymphadenopathy.   ?Heart:regular rate and rhythm.  S1 and S2 without leg edema. ?Lung: Clear without any rhonchi or wheezes.  No dullness to percussion. ?Abdomin: Soft, nontender, nondistended with good bowel sounds.  No hepatosplenomegaly. ?Musculoskeletal: No joint deformity or effusion.  Full range of motion noted. ?Neurological: No deficits noted on motor, sensory and deep tendon reflex exam. ?Skin: No petechial rash or dryness.  Appeared moist.  ? ? ? ? ? ? ?Lab Results: ?Lab Results  ?Component Value Date  ? WBC 11.5 (H) 09/08/2021  ? HGB 13.8 09/08/2021  ? HCT 43.4 09/08/2021  ? MCV 89.3 09/08/2021  ? PLT 259 09/08/2021  ? ?  Chemistry   ?   ?Component Value Date/Time  ? NA 137 09/08/2021 1510  ? K 4.7 09/08/2021 1510  ? CL 101 09/08/2021 1510  ? CO2 29 09/08/2021 1510  ? BUN 22 09/08/2021 1510  ? CREATININE 1.69 (H) 09/08/2021 1510  ?    ?Component Value Date/Time  ? CALCIUM 9.4 09/08/2021 1510  ? ALKPHOS 57 09/08/2021 1510  ? AST 20 09/08/2021 1510  ? ALT 11 09/08/2021 1510  ? BILITOT 0.7 09/08/2021 1510  ?  ? ? ? ?  IMPRESSION: ?1. Prior right nephrectomy without evidence of local recurrence on ?noncontrast CT. ?2. Stable solid bilateral pulmonary nodules. ?3. No evidence of new or progressive metastatic disease within the ?chest, abdomen, or pelvis. ?4. Unchanged aneurysmal dilation of the ascending thoracic aorta ?measuring 4.9 cm. Ascending thoracic aortic aneurysm. Recommend ?semi-annual imaging followup by CTA or MRA and referral to ?cardiothoracic surgery if not already obtained. This recommendation ?follows 2010  ACCF/AHA/AATS/ACR/ASA/SCA/SCAI/SIR/STS/SVM Guidelines ?for the Diagnosis and Management of Patients With Thoracic Aortic ?Disease. Circulation. 2010; 121: D638-V564. Aortic aneurysm NOS ?(ICD10-I71.9) ?5. Left-sided colonic diverticulosis without findings of acute ?diverticulitis. Short segment of sigmoid colonic wall thickening ?appears similar to prior and may reflect sequela of chronic ?diverticulitis/ Segmental colitis associated with diverticulosis, ?consider correlation with findings on prior colonoscopy. ?6.  Aortic Atherosclerosis (ICD10-I70.0). ?  ? ? ? ? ?Impression and Plan: ? ?76 year old man with: ? ?1.  T1a clear-cell renal cell carcinoma on the right side diagnosed in 2022. ?  ?His disease status was updated at this time and treatment choices were reviewed.  CT scan obtained on Sep 07, 2021 was personally reviewed and showed no evidence of metastatic disease at this time.  I do not recommend any additional treatment other than an active surveillance which we will do every 6 months to complete 2 years of therapy and then annually after that.  He is agreeable to proceed. ?  ?2.  Bilateral pulmonary nodules: Unchanged on CT scan Sep 08, 2021.  Differential diagnosis was reviewed at this time.  Benign etiology is the most likely consideration at this time.  I recommended continued active surveillance case for dealing with metastatic disease which is considered less likely. ? ? ?  ?3.  Melanoma: He is currently on active surveillance without any evidence of relapsed disease. ?  ?  ?4.  Tetralogy of Fallot: He continues to follow with cardiology and no recent exacerbation. ?  ?  ?5.  Follow-up: In 6 months for repeat follow-up. ?  ?  ?30  minutes were spent on this encounter.  The time was dedicated to reviewing laboratory data, disease status update, imaging studies reviewing differential diagnosis of these findings. ?  ?  ?  ? ? ?Zola Button, MD ?5/9/20238:49 AM ? ?

## 2022-02-14 ENCOUNTER — Telehealth: Payer: Self-pay | Admitting: Oncology

## 2022-02-14 NOTE — Telephone Encounter (Signed)
Called patient regarding upcoming November appointment, patient is notified.  

## 2022-03-02 ENCOUNTER — Encounter: Payer: Self-pay | Admitting: *Deleted

## 2022-03-17 ENCOUNTER — Inpatient Hospital Stay: Payer: Medicare Other | Attending: Oncology

## 2022-03-17 DIAGNOSIS — K5730 Diverticulosis of large intestine without perforation or abscess without bleeding: Secondary | ICD-10-CM | POA: Insufficient documentation

## 2022-03-17 DIAGNOSIS — Z882 Allergy status to sulfonamides status: Secondary | ICD-10-CM | POA: Diagnosis not present

## 2022-03-17 DIAGNOSIS — Q213 Tetralogy of Fallot: Secondary | ICD-10-CM | POA: Insufficient documentation

## 2022-03-17 DIAGNOSIS — Z8774 Personal history of (corrected) congenital malformations of heart and circulatory system: Secondary | ICD-10-CM | POA: Diagnosis not present

## 2022-03-17 DIAGNOSIS — Z79899 Other long term (current) drug therapy: Secondary | ICD-10-CM | POA: Insufficient documentation

## 2022-03-17 DIAGNOSIS — I700 Atherosclerosis of aorta: Secondary | ICD-10-CM | POA: Diagnosis not present

## 2022-03-17 DIAGNOSIS — C641 Malignant neoplasm of right kidney, except renal pelvis: Secondary | ICD-10-CM | POA: Insufficient documentation

## 2022-03-17 DIAGNOSIS — Z885 Allergy status to narcotic agent status: Secondary | ICD-10-CM | POA: Insufficient documentation

## 2022-03-17 DIAGNOSIS — Z905 Acquired absence of kidney: Secondary | ICD-10-CM | POA: Diagnosis not present

## 2022-03-17 DIAGNOSIS — R918 Other nonspecific abnormal finding of lung field: Secondary | ICD-10-CM | POA: Insufficient documentation

## 2022-03-17 LAB — CMP (CANCER CENTER ONLY)
ALT: 16 U/L (ref 0–44)
AST: 20 U/L (ref 15–41)
Albumin: 4.5 g/dL (ref 3.5–5.0)
Alkaline Phosphatase: 61 U/L (ref 38–126)
Anion gap: 6 (ref 5–15)
BUN: 31 mg/dL — ABNORMAL HIGH (ref 8–23)
CO2: 31 mmol/L (ref 22–32)
Calcium: 8.8 mg/dL — ABNORMAL LOW (ref 8.9–10.3)
Chloride: 104 mmol/L (ref 98–111)
Creatinine: 1.82 mg/dL — ABNORMAL HIGH (ref 0.61–1.24)
GFR, Estimated: 38 mL/min — ABNORMAL LOW (ref >60)
Glucose, Bld: 97 mg/dL (ref 70–99)
Potassium: 4.6 mmol/L (ref 3.5–5.1)
Sodium: 141 mmol/L (ref 135–145)
Total Bilirubin: 0.8 mg/dL (ref 0.3–1.2)
Total Protein: 6.6 g/dL (ref 6.5–8.1)

## 2022-03-17 LAB — CBC WITH DIFFERENTIAL (CANCER CENTER ONLY)
Abs Immature Granulocytes: 0.03 10*3/uL (ref 0.00–0.07)
Basophils Absolute: 0.1 10*3/uL (ref 0.0–0.1)
Basophils Relative: 1 %
Eosinophils Absolute: 0.4 10*3/uL (ref 0.0–0.5)
Eosinophils Relative: 4 %
HCT: 46.6 % (ref 39.0–52.0)
Hemoglobin: 15.4 g/dL (ref 13.0–17.0)
Immature Granulocytes: 0 %
Lymphocytes Relative: 39 %
Lymphs Abs: 3.3 10*3/uL (ref 0.7–4.0)
MCH: 29.1 pg (ref 26.0–34.0)
MCHC: 33 g/dL (ref 30.0–36.0)
MCV: 87.9 fL (ref 80.0–100.0)
Monocytes Absolute: 0.9 10*3/uL (ref 0.1–1.0)
Monocytes Relative: 10 %
Neutro Abs: 3.9 10*3/uL (ref 1.7–7.7)
Neutrophils Relative %: 46 %
Platelet Count: 236 10*3/uL (ref 150–400)
RBC: 5.3 MIL/uL (ref 4.22–5.81)
RDW: 14.6 % (ref 11.5–15.5)
WBC Count: 8.5 10*3/uL (ref 4.0–10.5)
nRBC: 0 % (ref 0.0–0.2)

## 2022-03-23 ENCOUNTER — Ambulatory Visit (HOSPITAL_COMMUNITY)
Admission: RE | Admit: 2022-03-23 | Discharge: 2022-03-23 | Disposition: A | Discharge status: Home / Self Care | Payer: Medicare Other | Source: Ambulatory Visit | Attending: Oncology | Admitting: Oncology

## 2022-03-23 DIAGNOSIS — C641 Malignant neoplasm of right kidney, except renal pelvis: Secondary | ICD-10-CM | POA: Diagnosis not present

## 2022-03-24 ENCOUNTER — Inpatient Hospital Stay (HOSPITAL_BASED_OUTPATIENT_CLINIC_OR_DEPARTMENT_OTHER): Payer: Medicare Other | Admitting: Oncology

## 2022-03-24 VITALS — BP 151/86 | HR 74 | Temp 98.2°F | Resp 18 | Wt 163.1 lb

## 2022-03-24 DIAGNOSIS — C641 Malignant neoplasm of right kidney, except renal pelvis: Secondary | ICD-10-CM | POA: Diagnosis not present

## 2022-03-24 NOTE — Progress Notes (Signed)
Hematology and Oncology Follow Up Visit  Christopher Mosley [id number] March 05, 1946 76 y.o. 03/24/2022 2:48 PM Wile, Christopher Sans, MDWile, Christopher Sans, MD   Principle Diagnosis: 76 year old man with T1a clear-cell renal cell carcinoma diagnosed in 2022.  He developed pulmonary nodules with work-up currently unrevealing for malignancy.   Prior Therapy:    He is robotic assisted laparoscopic right radical nephrectomy on June 26, 2020.  The final pathology showed clear-cell renal cell carcinoma nuclear grade 2 measuring 4.0 cm with the pathological staging of T1a.     Current therapy: Active surveillance.  Interim History: Mr. Mosley returns today for repeat follow-up.  Since the last visit, he reports no major changes in his health.  He denies any recent hospitalizations or illnesses.  He denies any chest pain or shortness of breath.  He denies any cough or wheezing.  He denies any hemoptysis or hematemesis.  His performance status and quality of life remain excellent.     Medications: Updated on review. Current Outpatient Medications  Medication Sig Dispense Refill   amLODipine (NORVASC) 5 MG tablet Take 5 mg by mouth daily.     levothyroxine (SYNTHROID) 75 MCG tablet TAKE 1 TABLET BY MOUTH ONCE DAILY BEFORE BREAKFAST (Patient taking differently: Take 75 mcg by mouth daily before breakfast.) 90 tablet 3   rosuvastatin (CRESTOR) 20 MG tablet Take 20 mg by mouth at bedtime.     testosterone cypionate (DEPOTESTOSTERONE CYPIONATE) 200 MG/ML injection INJECT 0.75 ML INTO MUSCLE EVERY 30 DAYS (Patient taking differently: Inject 150 mg into the muscle every 28 (twenty-eight) days.) 1 mL 3   No current facility-administered medications for this visit.     Allergies:  Allergies  Allergen Reactions   Codeine Other (See Comments)    Syncope    Sulfa Antibiotics Diarrhea and Rash      Physical Exam:   Blood pressure (!) 151/86, pulse 74, temperature 98.2 F (36.8 C), temperature  source Oral, resp. rate 18, weight 163 lb 1.6 oz (74 kg), SpO2 96 %.   ECOG: 1   General appearance: Alert, awake without any distress. Head: Atraumatic without abnormalities Oropharynx: Without any thrush or ulcers. Eyes: No scleral icterus. Lymph nodes: No lymphadenopathy noted in the cervical, supraclavicular, or axillary nodes Heart:regular rate and rhythm, without any murmurs or gallops.   Lung: Clear to auscultation without any rhonchi, wheezes or dullness to percussion. Abdomin: Soft, nontender without any shifting dullness or ascites. Musculoskeletal: No clubbing or cyanosis. Neurological: No motor or sensory deficits. Skin: No rashes or lesions.        Lab Results: Lab Results  Component Value Date   WBC 8.5 03/17/2022   HGB 15.4 03/17/2022   HCT 46.6 03/17/2022   MCV 87.9 03/17/2022   PLT 236 03/17/2022     Chemistry      Component Value Date/Time   NA 141 03/17/2022 0958   K 4.6 03/17/2022 0958   CL 104 03/17/2022 0958   CO2 31 03/17/2022 0958   BUN 31 (H) 03/17/2022 0958   CREATININE 1.82 (H) 03/17/2022 0958      Component Value Date/Time   CALCIUM 8.8 (L) 03/17/2022 0958   ALKPHOS 61 03/17/2022 0958   AST 20 03/17/2022 0958   ALT 16 03/17/2022 0958   BILITOT 0.8 03/17/2022 0958          Narrative & Impression  CLINICAL DATA:  Renal cell carcinoma post right partial nephrectomy 06/26/2020. * Tracking Code: BO *   EXAM: CT CHEST, ABDOMEN AND  PELVIS WITHOUT CONTRAST   TECHNIQUE: Multidetector CT imaging of the chest, abdomen and pelvis was performed following the standard protocol without IV contrast.   RADIATION DOSE REDUCTION: This exam was performed according to the departmental dose-optimization program which includes automated exposure control, adjustment of the mA and/or kV according to patient size and/or use of iterative reconstruction technique.   COMPARISON:  CT 09/08/2021 and 04/05/2021   FINDINGS: CT CHEST FINDINGS    Cardiovascular: No acute vascular findings are identified. Patient is status post median sternotomy and pulmonic valve replacement. There is stable cardiomegaly. There are calcifications of the aortic valve with diffuse atherosclerosis of the aorta, great vessels and coronary arteries. Dilatation of the ascending aorta to 4.8 cm similar to the previous study. There is chronic central enlargement of the pulmonary arteries.   Mediastinum/Nodes: There are no enlarged mediastinal, hilar or axillary lymph nodes. Scattered small mediastinal lymph nodes are unchanged. Hilar assessment is limited by the lack of intravenous contrast, although the hilar contours appear unchanged. Previous thyroidectomy. Small hiatal hernia.   Lungs/Pleura: No pleural effusion or pneumothorax. Scattered pulmonary nodules are again noted bilaterally which are unchanged and consistent with benign findings. Largest nodules measure 9 mm in the left upper lobe (image 26/4) and 8 mm in the right middle lobe (image 64/4). No new or enlarging nodules identified. Unchanged scattered mild pulmonary scarring and pleural thickening.   Musculoskeletal/Chest wall: No chest wall mass or suspicious osseous findings. Chronic rib deformities bilaterally and postsurgical changes in the sternum.   CT ABDOMEN AND PELVIS FINDINGS   Hepatobiliary: The liver appears unremarkable as imaged in the noncontrast state. No evidence of gallstones, gallbladder wall thickening or biliary dilatation.   Pancreas: Unremarkable. No pancreatic ductal dilatation or surrounding inflammatory changes.   Spleen: Normal in size without focal abnormality.   Adrenals/Urinary Tract: Both adrenal glands appear normal. Status post right nephrectomy with postsurgical changes in the nephrectomy bed, but no signs of local recurrence. The left kidney appears unchanged with scattered cortical calcifications and hypodense lesions measuring up to 3.2 cm in  the lower interpolar region (image 75/2). No enlarging left renal masses are identified. No evidence of ureteral calculus or hydronephrosis. The bladder appears normal for its degree of distention.   Stomach/Bowel: No enteric contrast administered. The stomach appears unremarkable for its degree of distension. No evidence of bowel wall thickening, distention or surrounding inflammatory change. The appendix appears normal. There are diverticular changes of the sigmoid colon.   Vascular/Lymphatic: There are no enlarged abdominal or pelvic lymph nodes. Aortic and branch vessel atherosclerosis without evidence of aneurysm.   Reproductive: The prostate gland and seminal vesicles appear unremarkable. The right testis appears partially retracted into the right inguinal canal, previously appearing normally located.   Other: Trace pelvic ascites without peritoneal nodularity. Intact abdominal wall.   Musculoskeletal: No acute or significant osseous findings. Lumbar spondylosis.   IMPRESSION: 1. Stable CTS of the chest, abdomen and pelvis status post right nephrectomy. No evidence of local recurrence or metastatic disease. 2. Stable bilateral pulmonary nodules, consistent with benign findings. 3. Unchanged appearance of the left kidney with incompletely characterized low-density lesions which are probably cysts based on stability. Given the patient's history, continued surveillance recommended. 4. Stable dilatation of the ascending aorta and central pulmonary arteries status post pulmonic valve replacement. 5. Interval retraction of the right testis into the right inguinal canal, previously appearing normally located. Correlate clinically. 6. Trace pelvic ascites without peritoneal nodularity. 7. Distal colonic diverticulosis without evidence  of acute inflammation. 8.  Aortic Atherosclerosis (ICD10-I70.0).        Impression and Plan:  76 year old man with:  1.  Kidney cancer  diagnosed in 2022.  He was found to have right T1a clear-cell tumor without any evidence of relapsed disease.   He continues to be on active surveillance after surgical resection which will be continued for the time being.  CT scan on March 23, 2022 was personally reviewed and did not show any evidence to suggest relapse disease.  Based on these findings, I have recommended continued surveillance with a repeat CT scan in 6 months.  If he continues to be stable annual surveillance to complete 5 years is recommended and discontinue surveillance after that.   2.  Bilateral pulmonary nodules: Appears to be an in etiology.  No evidence of any changes based on recent imaging studies.  We will repeat in 6 months and annually after that.       3.  Tetralogy of Fallot: Status post surgical repair and follow-up with cardiology.  No recent decompensation.   4.  Follow-up: In 4 months for follow-up visit.     30  minutes were spent on this visit.  The time was dedicated to updating disease status, treatment choices and outlining future plan of care review.         Zola Button, MD 11/16/20232:48 PM

## 2022-06-29 ENCOUNTER — Other Ambulatory Visit: Payer: Self-pay

## 2022-06-29 DIAGNOSIS — C641 Malignant neoplasm of right kidney, except renal pelvis: Secondary | ICD-10-CM

## 2022-07-13 ENCOUNTER — Other Ambulatory Visit: Payer: Self-pay | Admitting: Internal Medicine

## 2022-07-13 DIAGNOSIS — M7989 Other specified soft tissue disorders: Secondary | ICD-10-CM

## 2022-07-26 ENCOUNTER — Ambulatory Visit
Admission: RE | Admit: 2022-07-26 | Discharge: 2022-07-26 | Disposition: A | Discharge status: Home / Self Care | Payer: Medicare Other | Source: Ambulatory Visit | Attending: Internal Medicine | Admitting: Internal Medicine

## 2022-07-26 DIAGNOSIS — M7989 Other specified soft tissue disorders: Secondary | ICD-10-CM

## 2022-09-20 ENCOUNTER — Other Ambulatory Visit: Payer: Self-pay | Admitting: Medical Oncology

## 2022-09-20 DIAGNOSIS — C641 Malignant neoplasm of right kidney, except renal pelvis: Secondary | ICD-10-CM

## 2022-09-22 ENCOUNTER — Other Ambulatory Visit: Payer: Self-pay | Admitting: Nurse Practitioner

## 2022-09-22 ENCOUNTER — Ambulatory Visit (HOSPITAL_COMMUNITY)
Admission: RE | Admit: 2022-09-22 | Discharge: 2022-09-22 | Disposition: A | Discharge status: Home / Self Care | Payer: Medicare Other | Source: Ambulatory Visit | Attending: Oncology | Admitting: Oncology

## 2022-09-22 ENCOUNTER — Inpatient Hospital Stay: Payer: Medicare Other | Attending: Internal Medicine

## 2022-09-22 DIAGNOSIS — C641 Malignant neoplasm of right kidney, except renal pelvis: Secondary | ICD-10-CM | POA: Insufficient documentation

## 2022-09-22 DIAGNOSIS — Z85820 Personal history of malignant melanoma of skin: Secondary | ICD-10-CM | POA: Diagnosis not present

## 2022-09-22 DIAGNOSIS — N281 Cyst of kidney, acquired: Secondary | ICD-10-CM | POA: Diagnosis not present

## 2022-09-22 DIAGNOSIS — Z8719 Personal history of other diseases of the digestive system: Secondary | ICD-10-CM | POA: Diagnosis not present

## 2022-09-22 DIAGNOSIS — Z85850 Personal history of malignant neoplasm of thyroid: Secondary | ICD-10-CM | POA: Insufficient documentation

## 2022-09-22 DIAGNOSIS — Z905 Acquired absence of kidney: Secondary | ICD-10-CM | POA: Diagnosis not present

## 2022-09-22 DIAGNOSIS — K8020 Calculus of gallbladder without cholecystitis without obstruction: Secondary | ICD-10-CM | POA: Diagnosis not present

## 2022-09-22 DIAGNOSIS — I7121 Aneurysm of the ascending aorta, without rupture: Secondary | ICD-10-CM | POA: Diagnosis not present

## 2022-09-22 DIAGNOSIS — Z79899 Other long term (current) drug therapy: Secondary | ICD-10-CM | POA: Insufficient documentation

## 2022-09-22 DIAGNOSIS — I700 Atherosclerosis of aorta: Secondary | ICD-10-CM | POA: Insufficient documentation

## 2022-09-22 DIAGNOSIS — Z882 Allergy status to sulfonamides status: Secondary | ICD-10-CM | POA: Diagnosis not present

## 2022-09-22 DIAGNOSIS — Z885 Allergy status to narcotic agent status: Secondary | ICD-10-CM | POA: Diagnosis not present

## 2022-09-22 LAB — CBC WITH DIFFERENTIAL (CANCER CENTER ONLY)
Abs Immature Granulocytes: 0.02 10*3/uL (ref 0.00–0.07)
Basophils Absolute: 0 10*3/uL (ref 0.0–0.1)
Basophils Relative: 1 %
Eosinophils Absolute: 0.4 10*3/uL (ref 0.0–0.5)
Eosinophils Relative: 5 %
HCT: 46.6 % (ref 39.0–52.0)
Hemoglobin: 15.2 g/dL (ref 13.0–17.0)
Immature Granulocytes: 0 %
Lymphocytes Relative: 40 %
Lymphs Abs: 3.4 10*3/uL (ref 0.7–4.0)
MCH: 29.2 pg (ref 26.0–34.0)
MCHC: 32.6 g/dL (ref 30.0–36.0)
MCV: 89.4 fL (ref 80.0–100.0)
Monocytes Absolute: 0.9 10*3/uL (ref 0.1–1.0)
Monocytes Relative: 10 %
Neutro Abs: 3.8 10*3/uL (ref 1.7–7.7)
Neutrophils Relative %: 44 %
Platelet Count: 251 10*3/uL (ref 150–400)
RBC: 5.21 MIL/uL (ref 4.22–5.81)
RDW: 13.4 % (ref 11.5–15.5)
WBC Count: 8.5 10*3/uL (ref 4.0–10.5)
nRBC: 0 % (ref 0.0–0.2)

## 2022-09-22 LAB — CMP (CANCER CENTER ONLY)
ALT: 24 U/L (ref 0–44)
AST: 26 U/L (ref 15–41)
Albumin: 4.7 g/dL (ref 3.5–5.0)
Alkaline Phosphatase: 68 U/L (ref 38–126)
Anion gap: 9 (ref 5–15)
BUN: 35 mg/dL — ABNORMAL HIGH (ref 8–23)
CO2: 28 mmol/L (ref 22–32)
Calcium: 9.3 mg/dL (ref 8.9–10.3)
Chloride: 102 mmol/L (ref 98–111)
Creatinine: 1.94 mg/dL — ABNORMAL HIGH (ref 0.61–1.24)
GFR, Estimated: 36 mL/min — ABNORMAL LOW (ref >60)
Glucose, Bld: 151 mg/dL — ABNORMAL HIGH (ref 70–99)
Potassium: 4.3 mmol/L (ref 3.5–5.1)
Sodium: 139 mmol/L (ref 135–145)
Total Bilirubin: 0.7 mg/dL (ref 0.3–1.2)
Total Protein: 7 g/dL (ref 6.5–8.1)

## 2022-09-22 MED ORDER — SODIUM CHLORIDE (PF) 0.9 % IJ SOLN
INTRAMUSCULAR | Status: AC
  Filled 2022-09-22: qty 50

## 2022-09-29 ENCOUNTER — Inpatient Hospital Stay (HOSPITAL_BASED_OUTPATIENT_CLINIC_OR_DEPARTMENT_OTHER): Payer: Medicare Other | Admitting: Internal Medicine

## 2022-09-29 VITALS — BP 179/83 | HR 63 | Temp 97.8°F | Resp 15 | Wt 154.9 lb

## 2022-09-29 DIAGNOSIS — C649 Malignant neoplasm of unspecified kidney, except renal pelvis: Secondary | ICD-10-CM | POA: Diagnosis not present

## 2022-09-29 DIAGNOSIS — C641 Malignant neoplasm of right kidney, except renal pelvis: Secondary | ICD-10-CM | POA: Diagnosis not present

## 2022-09-29 NOTE — Progress Notes (Signed)
[facility] Telephone:([phone]   Fax:([phone]  OFFICE PROGRESS NOTE  Melida Quitter, MD [address] [place] [address]  DIAGNOSIS: Stage I (T1a, N0, M0) clear-cell renal cell carcinoma diagnosed in 2022.  PRIOR THERAPY: Status post robotic assisted laparoscopic right radical nephrectomy on June 26, 2020 and the final pathology showed clear-cell renal cell carcinoma nuclear grade 2 measuring 4.0 cm.  CURRENT THERAPY: Observation  INTERVAL HISTORY: Christopher Mosley 77 y.o. male came to the clinic today to establish care with me after Dr. Clelia Croft his primary oncologist left the practice.  The patient is feeling fine today with no concerning complaints.  He denied having any current chest pain, shortness of breath, cough or hemoptysis.  He has no nausea, vomiting, diarrhea or constipation.  He has no headache or visual changes.  He has no fever or chills.  He is here today for evaluation with repeat CT scan of the chest, abdomen and pelvis for restaging of his disease.  MEDICAL HISTORY: Past Medical History:  Diagnosis Date   Arthritis    Colon polyps 09/08/2017   q 5 years; need records   Complication of anesthesia    Heart murmur    History of hepatitis B    Hyperlipemia    Hypertension    Hypothyroidism    Lesion of vocal cord 02/06/2017   Right granuloma; resolved on f/u   Melanoma (HCC)    Papillary thyroid carcinoma (HCC)    s/p thyroidectomy,kidney,melanomas(2)   PONV (postoperative nausea and vomiting)    Urine incontinence     ALLERGIES:  is allergic to codeine and sulfa antibiotics.  MEDICATIONS:  Current Outpatient Medications  Medication Sig Dispense Refill   amLODipine (NORVASC) 5 MG tablet Take 5 mg by mouth daily.     levothyroxine (SYNTHROID) 75 MCG tablet TAKE 1 TABLET BY MOUTH ONCE DAILY BEFORE BREAKFAST (Patient taking differently: Take 75 mcg by mouth daily before breakfast.) 90 tablet 3   rosuvastatin (CRESTOR) 20  MG tablet Take 20 mg by mouth at bedtime.     testosterone cypionate (DEPOTESTOSTERONE CYPIONATE) 200 MG/ML injection INJECT 0.75 ML INTO MUSCLE EVERY 30 DAYS (Patient taking differently: Inject 150 mg into the muscle every 28 (twenty-eight) days.) 1 mL 3   No current facility-administered medications for this visit.    SURGICAL HISTORY:  Past Surgical History:  Procedure Laterality Date   BLADDER SURGERY  2016   BLALOCK PROCEDURE  1954   COLONOSCOPY     multiple - hx adenomas 2010 and 2015   CYSTOSCOPY N/A 06/26/2020   Procedure: FLEXIBLE CYSTOSCOPY FOR BLADDER STONE AND PLACEMENT OF FOLEY;  Surgeon: Jannifer Hick, MD;  Location: [facility];  Service: Urology;  Laterality: N/A;   MELANOMA EXCISION     [place] shunt  1957   PULMONARY VALVE REPLACEMENT  10/21/2016   pig valve   ROBOTIC ASSITED PARTIAL NEPHRECTOMY Right 06/26/2020   Procedure: XI ROBOTIC ASSITED LAPAROSCOPIC RADICAL NEPHRECTOMY;  Surgeon: Jannifer Hick, MD;  Location: [facility];  Service: Urology;  Laterality: Right;   TETRALOGY OF FALLOT REPAIR  1961   TOTAL KNEE ARTHROPLASTY Left 2016   TOTAL THYROIDECTOMY  2002    REVIEW OF SYSTEMS:  Constitutional: negative Eyes: negative Ears, nose, mouth, throat, and face: negative Respiratory: negative Cardiovascular: negative Gastrointestinal: negative Genitourinary:negative Integument/breast: negative Hematologic/lymphatic: negative Musculoskeletal:negative Neurological: negative Behavioral/Psych: negative Endocrine: negative Allergic/Immunologic: negative   PHYSICAL EXAMINATION: General appearance: alert, cooperative, fatigued, and no distress  Head: Normocephalic, without obvious abnormality, atraumatic Neck: no adenopathy, no JVD, supple, symmetrical, trachea midline, and thyroid not enlarged, symmetric, no tenderness/mass/nodules Lymph nodes: Cervical, supraclavicular, and axillary nodes normal. Resp: clear to auscultation bilaterally Back: symmetric, no curvature. ROM  normal. No CVA tenderness. Cardio: regular rate and rhythm, S1, S2 normal, no murmur, click, rub or gallop GI: soft, non-tender; bowel sounds normal; no masses,  no organomegaly Extremities: extremities normal, atraumatic, no cyanosis or edema Neurologic: Alert and oriented X 3, normal strength and tone. Normal symmetric reflexes. Normal coordination and gait  ECOG PERFORMANCE STATUS: 1 - Symptomatic but completely ambulatory  Blood pressure (!) 179/83, pulse 63, temperature 97.8 F (36.6 C), temperature source Oral, resp. rate 15, weight 154 lb 14.4 oz (70.3 kg), SpO2 99 %.  LABORATORY DATA: Lab Results  Component Value Date   WBC 8.5 09/22/2022   HGB 15.2 09/22/2022   HCT 46.6 09/22/2022   MCV 89.4 09/22/2022   PLT 251 09/22/2022      Chemistry      Component Value Date/Time   NA 139 09/22/2022 0806   K 4.3 09/22/2022 0806   CL 102 09/22/2022 0806   CO2 28 09/22/2022 0806   BUN 35 (H) 09/22/2022 0806   CREATININE 1.94 (H) 09/22/2022 0806      Component Value Date/Time   CALCIUM 9.3 09/22/2022 0806   ALKPHOS 68 09/22/2022 0806   AST 26 09/22/2022 0806   ALT 24 09/22/2022 0806   BILITOT 0.7 09/22/2022 0806       RADIOGRAPHIC STUDIES: CT Chest Wo Contrast  Result Date: 09/25/2022 CLINICAL DATA:  Follow-up renal cell carcinoma. Previous right nephrectomy. Surveillance. * Tracking Code: BO * EXAM: CT CHEST, ABDOMEN AND PELVIS WITHOUT CONTRAST TECHNIQUE: Multidetector CT imaging of the chest, abdomen and pelvis was performed following the standard protocol without IV contrast. RADIATION DOSE REDUCTION: This exam was performed according to the departmental dose-optimization program which includes automated exposure control, adjustment of the mA and/or kV according to patient size and/or use of iterative reconstruction technique. COMPARISON:  03/23/2022 FINDINGS: CT CHEST FINDINGS Cardiovascular: Prior pulmonary valve replacement. Stable marked enlargement of central pulmonary  arteries. Aortic and coronary atherosclerotic calcification incidentally noted. Stable 4.8 cm ascending thoracic aortic aneurysm. Mediastinum/Lymph Nodes: No masses or pathologically enlarged lymph nodes identified on this unenhanced exam. Lungs/Pleura: Bilateral pulmonary nodules remains stable gist in the central left upper lobe measuring 9 mm. No new or enlarging pulmonary nodules or masses identified. Evidence of pulmonary infiltrate or pleural effusion. Musculoskeletal:  No suspicious bone lesions identified. CT ABDOMEN AND PELVIS FINDINGS Hepatobiliary: No masses visualized on this unenhanced exam. Few tiny calcified gallstones are seen, however there is no evidence of cholecystitis or biliary ductal dilatation. Pancreas: No mass or inflammatory changes identified on this unenhanced exam. Spleen:  Within normal limits in size. Adrenals/Urinary Tract: Stable postop changes from right nephrectomy. Fluid attenuation left renal cyst again noted (No followup imaging is recommended). No definite left renal mass seen on this unenhanced exam. No evidence of ureteral calculi or hydronephrosis. Stomach/Bowel: No evidence of obstruction, inflammatory process, or abnormal fluid collections. Vascular/Lymphatic: No pathologically enlarged lymph nodes identified. No abdominal aortic aneurysm. Aortic atherosclerotic calcification incidentally noted. Reproductive:  No masses or other significant abnormality. Other:  None. Musculoskeletal:  No suspicious bone lesions identified. IMPRESSION: Stable solid bilateral pulmonary nodules. Prior right nephrectomy. No evidence of abdominal or pelvic metastatic disease. Cholelithiasis. No radiographic evidence of cholecystitis. Stable 4.8 cm ascending thoracic aortic aneurysm. Ascending thoracic aortic  aneurysm. Recommend semi-annual imaging followup by CTA or MRA and referral to cardiothoracic surgery if not already obtained. This recommendation follows 2010  ACCF/AHA/AATS/ACR/ASA/SCA/SCAI/SIR/STS/SVM Guidelines for the Diagnosis and Management of Patients With Thoracic Aortic Disease. Circulation. 2010; 121: Z610-R604. Aortic aneurysm NOS (ICD10-I71.9) Stable pulmonary valve replacement and marked enlargement of central pulmonary arteries. Electronically Signed   By: Danae Orleans M.D.   On: 09/25/2022 10:58   CT Abdomen Pelvis Wo Contrast  Result Date: 09/25/2022 CLINICAL DATA:  Follow-up renal cell carcinoma. Previous right nephrectomy. Surveillance. * Tracking Code: BO * EXAM: CT CHEST, ABDOMEN AND PELVIS WITHOUT CONTRAST TECHNIQUE: Multidetector CT imaging of the chest, abdomen and pelvis was performed following the standard protocol without IV contrast. RADIATION DOSE REDUCTION: This exam was performed according to the departmental dose-optimization program which includes automated exposure control, adjustment of the mA and/or kV according to patient size and/or use of iterative reconstruction technique. COMPARISON:  03/23/2022 FINDINGS: CT CHEST FINDINGS Cardiovascular: Prior pulmonary valve replacement. Stable marked enlargement of central pulmonary arteries. Aortic and coronary atherosclerotic calcification incidentally noted. Stable 4.8 cm ascending thoracic aortic aneurysm. Mediastinum/Lymph Nodes: No masses or pathologically enlarged lymph nodes identified on this unenhanced exam. Lungs/Pleura: Bilateral pulmonary nodules remains stable gist in the central left upper lobe measuring 9 mm. No new or enlarging pulmonary nodules or masses identified. Evidence of pulmonary infiltrate or pleural effusion. Musculoskeletal:  No suspicious bone lesions identified. CT ABDOMEN AND PELVIS FINDINGS Hepatobiliary: No masses visualized on this unenhanced exam. Few tiny calcified gallstones are seen, however there is no evidence of cholecystitis or biliary ductal dilatation. Pancreas: No mass or inflammatory changes identified on this unenhanced exam. Spleen:  Within  normal limits in size. Adrenals/Urinary Tract: Stable postop changes from right nephrectomy. Fluid attenuation left renal cyst again noted (No followup imaging is recommended). No definite left renal mass seen on this unenhanced exam. No evidence of ureteral calculi or hydronephrosis. Stomach/Bowel: No evidence of obstruction, inflammatory process, or abnormal fluid collections. Vascular/Lymphatic: No pathologically enlarged lymph nodes identified. No abdominal aortic aneurysm. Aortic atherosclerotic calcification incidentally noted. Reproductive:  No masses or other significant abnormality. Other:  None. Musculoskeletal:  No suspicious bone lesions identified. IMPRESSION: Stable solid bilateral pulmonary nodules. Prior right nephrectomy. No evidence of abdominal or pelvic metastatic disease. Cholelithiasis. No radiographic evidence of cholecystitis. Stable 4.8 cm ascending thoracic aortic aneurysm. Ascending thoracic aortic aneurysm. Recommend semi-annual imaging followup by CTA or MRA and referral to cardiothoracic surgery if not already obtained. This recommendation follows 2010 ACCF/AHA/AATS/ACR/ASA/SCA/SCAI/SIR/STS/SVM Guidelines for the Diagnosis and Management of Patients With Thoracic Aortic Disease. Circulation. 2010; 121: V409-W119. Aortic aneurysm NOS (ICD10-I71.9) Stable pulmonary valve replacement and marked enlargement of central pulmonary arteries. Electronically Signed   By: Danae Orleans M.D.   On: 09/25/2022 10:58    ASSESSMENT AND PLAN: This is a very pleasant 77 years old white male with Stage I (T1a, N0, M0) clear-cell renal cell carcinoma diagnosed in 2022. He is status post robotic assisted laparoscopic right radical nephrectomy on June 26, 2020 and the final pathology showed clear-cell renal cell carcinoma nuclear grade 2 measuring 4.0 cm. The patient has been on observation since that time and he is feeling fine. He had repeat CT scan of the chest, abdomen and pelvis performed  recently.  I personally and independently reviewed the scan and discussed the result with the patient today. His scan showed no concerning findings for disease recurrence or metastasis. I recommended for him to continue on observation with repeat CT scan  of the chest, abdomen and pelvis in 3 months. The patient was advised to call immediately if he has any other concerning symptoms in the interval. The patient voices understanding of current disease status and treatment options and is in agreement with the current care plan.  All questions were answered. The patient knows to call the clinic with any problems, questions or concerns. We can certainly see the patient much sooner if necessary.  The total time spent in the appointment was 30 minutes.  Disclaimer: This note was dictated with voice recognition software. Similar sounding words can inadvertently be transcribed and may not be corrected upon review.

## 2023-02-19 IMAGING — CT CT ABD-PELV W/O CM
2 of 4 series · 11 of 36 positions shown, 13 images · non-contrast
Comparison: Multiple priors including most recent CT April 05, 2021.

CLINICAL DATA: History of urologic cancer, restaging.

* Tracking Code: BO *
EXAM:
CT CHEST, ABDOMEN AND PELVIS WITHOUT CONTRAST
TECHNIQUE: Multidetector CT imaging of the chest, abdomen and pelvis was
performed following the standard protocol without IV contrast.
RADIATION DOSE REDUCTION: This exam was performed according to the
departmental dose-optimization program which includes automated
exposure control, adjustment of the mA and/or kV according to
patient size and/or use of iterative reconstruction technique.

[Series 2: cap w/o · axial · non-contrast · 0.77mm/px · z∈[-605,-55]mm · 8 of 133 slices shown, 10 images]
[im 12/133  mediastinal]
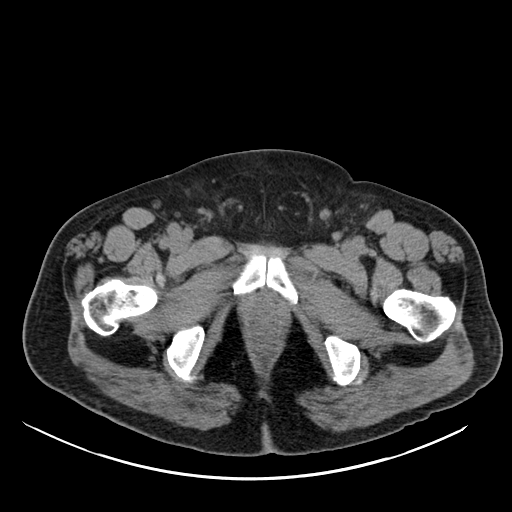
[im 12/133  lung]
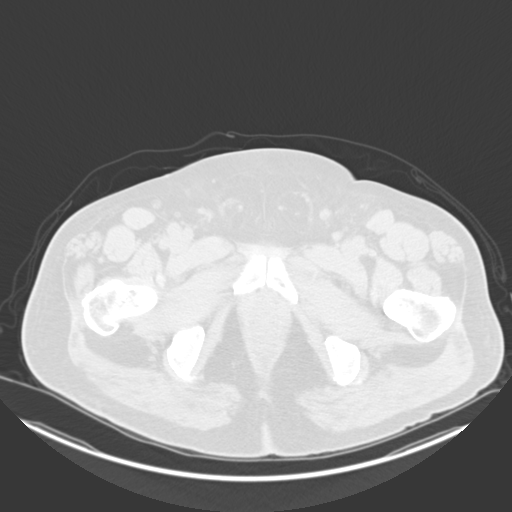
[im 34/133  lung]
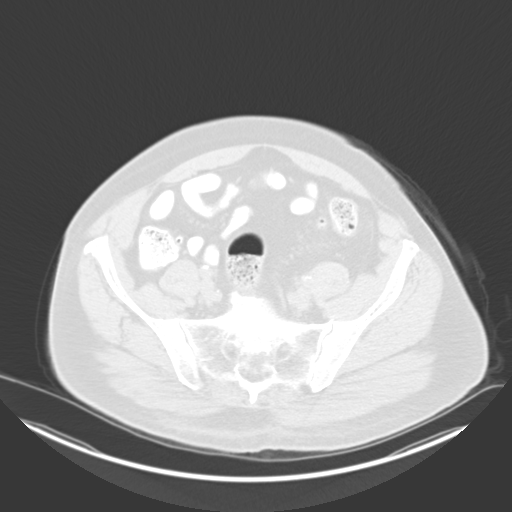
[im 45/133  lung]
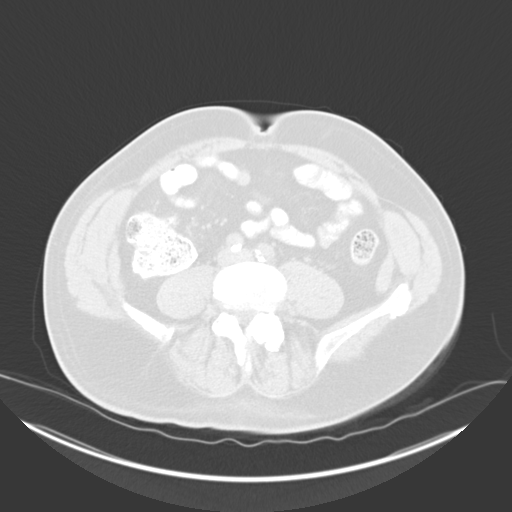
[im 56/133  lung]
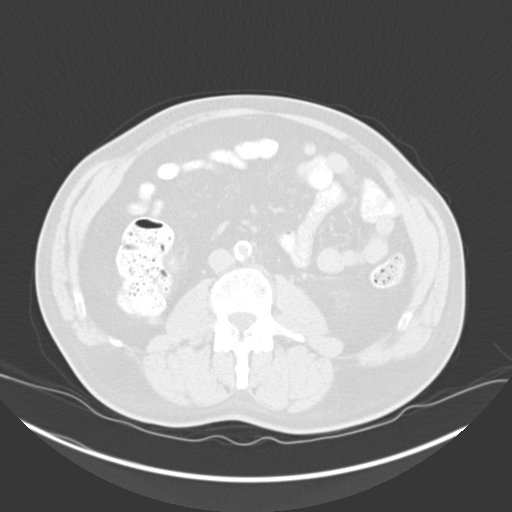
[im 78/133  mediastinal]
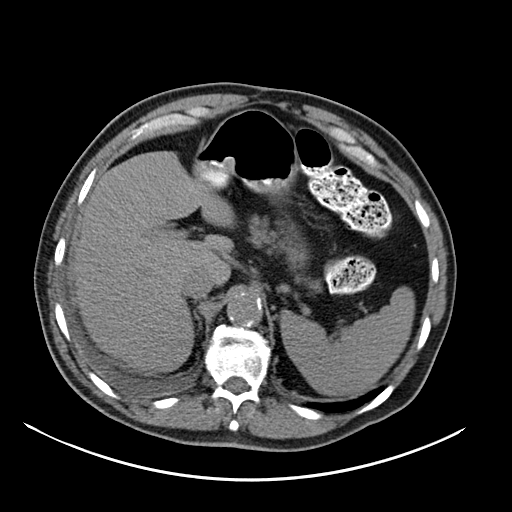
[im 78/133  lung]
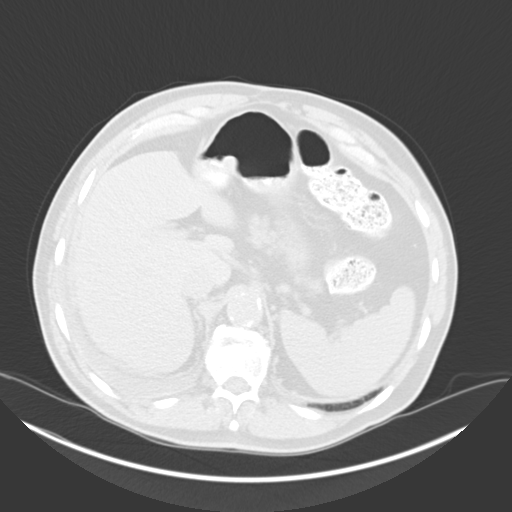
[im 89/133  lung]
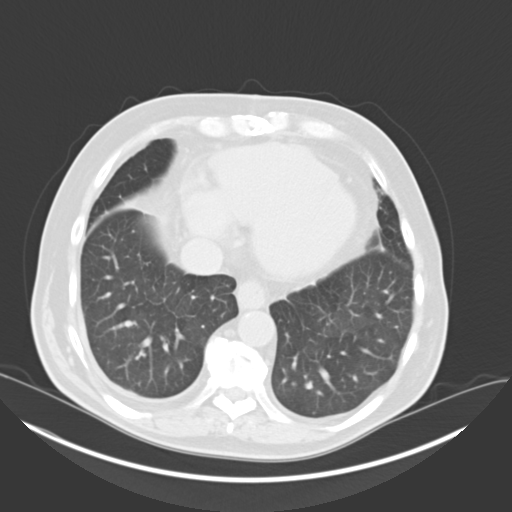
[im 100/133  lung]
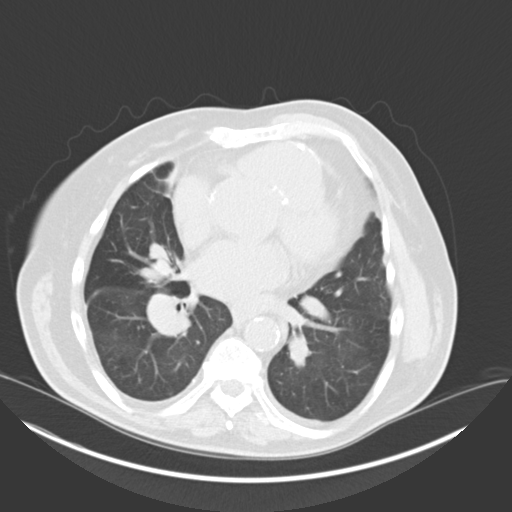
[im 122/133  lung]
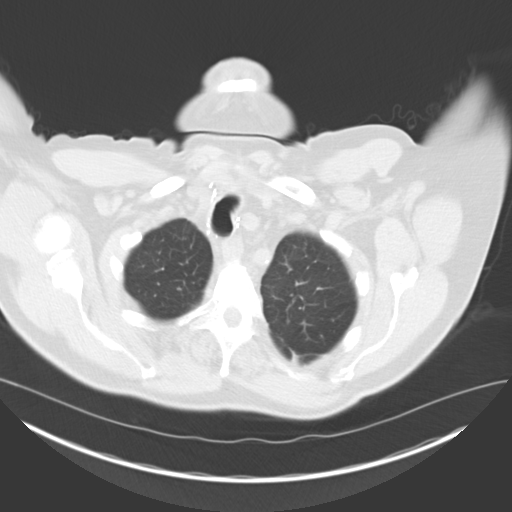

[Series 5: coronals · coronal · 0.72mm/px · 3 of 153 slices shown]
[im 31/153  lung]
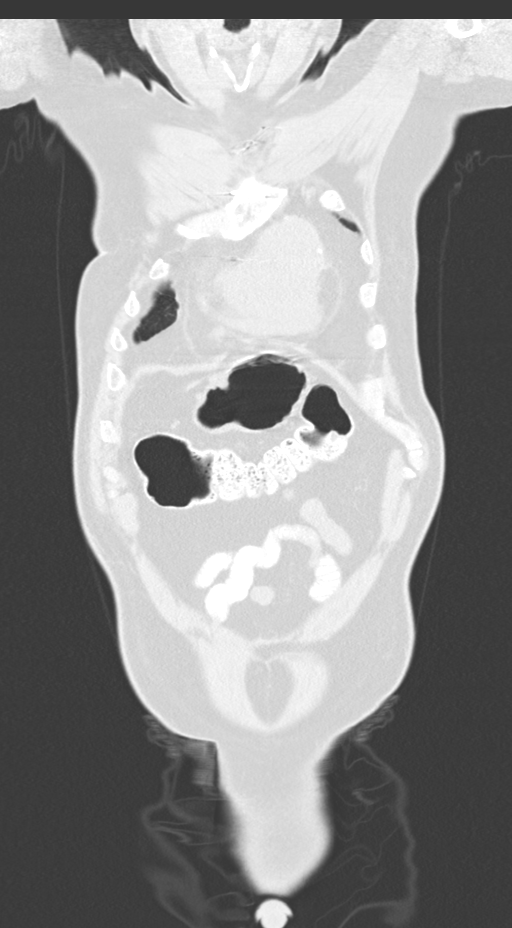
[im 61/153  lung]
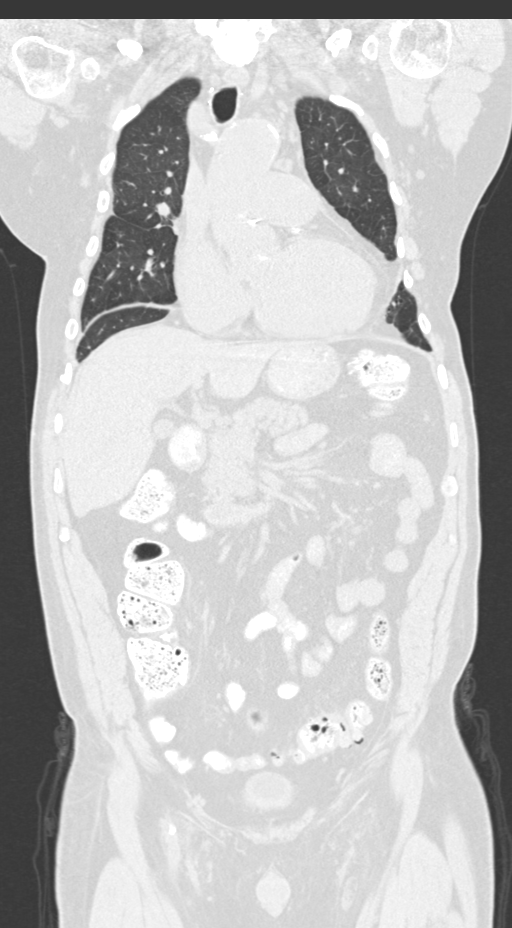
[im 92/153  lung]
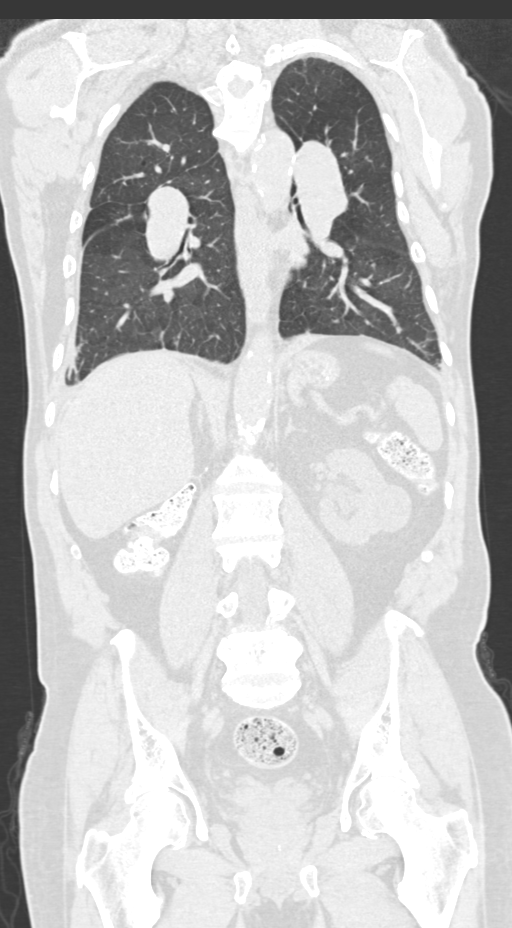

[11 of 36 positions shown; findings below may reference images not displayed]

FINDINGS: CT CHEST FINDINGS

Cardiovascular: Aortic atherosclerosis with unchanged aneurysmal
dilation of the ascending aorta measuring 4.9 cm. Prior pulmonic
valve replacement with similar appearance of the dilated pulmonary
arteries. Coronary artery calcifications.

Mediastinum/Nodes: Surgical clips in the neck likely reflect prior
thyroidectomy. No pathologically enlarged mediastinal, hilar or
axillary lymph nodes, noting limited sensitivity for the detection
of hilar adenopathy on this noncontrast study. Small hiatal hernia.

Lungs/Pleura: Solid bilateral pulmonary nodules are unchanged in
size when compared to prior examination. Previously indexed left
upper lobe pulmonary nodule measures 9 mm on image 32/4, unchanged.
No new suspicious pulmonary nodules or masses

Musculoskeletal: Prior median sternotomy. Multilevel degenerative
changes spine. Degenerative changes bilateral shoulders. No
aggressive lytic or blastic lesion of bone.

CT ABDOMEN PELVIS FINDINGS

Hepatobiliary: No suspicious hepatic lesion on this noncontrast
examination. Gallbladder is unremarkable. No biliary ductal
dilation.

Pancreas: No pancreatic ductal dilation or evidence of acute
inflammation.

Spleen: No splenomegaly or focal splenic lesion.

Adrenals/Urinary Tract: Bilateral adrenal glands appear normal.

Prior right nephrectomy with continued decrease in the soft tissue
nodularity in the nephrectomy bed likely reflecting postsurgical
change, no new suspicious soft tissue in the nephrectomy bed to
suggest recurrence.

No left-sided hydronephrosis. Similar cortical calcifications left
kidney. Hypodense renal lesions are incompletely evaluated on
noncontrast examination but likely reflect cysts, attention on
follow-up imaging in this renal oncologic patient is suggested.
Urinary bladder is unremarkable for degree of distension.

Stomach/Bowel: Radiopaque enteric contrast material traverses the
sigmoid colon. Small hiatal hernia otherwise the stomach is
unremarkable for degree of distension. No pathologic dilation of
small or large bowel. The appendix and terminal ileum appear normal.
Moderate volume of formed stool throughout the colon suggestive of
constipation. Left-sided colonic diverticulosis without findings of
acute diverticulitis. Short segment of sigmoid colonic wall
thickening on image 105/2 which appears similar prior and may
reflect sequela of chronic diverticulitis/ Segmental colitis
associated with diverticulosis. No evidence of acute bowel
inflammation.

Vascular/Lymphatic: Aortic and branch vessel atherosclerosis without
abdominal aortic aneurysm. No pathologically enlarged abdominal or
pelvic lymph nodes.

Reproductive: Prostate is unremarkable.

Other: No significant abdominopelvic free fluid. No discrete
peritoneal or omental nodularity.

Musculoskeletal: No aggressive lytic or blastic lesion of bone.
Multilevel degenerative changes spine.
IMPRESSION: 1. Prior right nephrectomy without evidence of local recurrence on
noncontrast CT.
2. Stable solid bilateral pulmonary nodules.
3. No evidence of new or progressive metastatic disease within the
chest, abdomen, or pelvis.
4. Unchanged aneurysmal dilation of the ascending thoracic aorta
measuring 4.9 cm. Ascending thoracic aortic aneurysm. Recommend
semi-annual imaging followup by CTA or MRA and referral to
cardiothoracic surgery if not already obtained. This recommendation
follows 0585 ACCF/AHA/AATS/ACR/ASA/SCA/JIM/CHAGOYA/BLESSED/RHOEDINS Guidelines
for the Diagnosis and Management of Patients With Thoracic Aortic
Disease. Circulation. 0585; 121: E266-e369. Aortic aneurysm NOS
(636M1-0YV.I)
5. Left-sided colonic diverticulosis without findings of acute
diverticulitis. Short segment of sigmoid colonic wall thickening
appears similar to prior and may reflect sequela of chronic
diverticulitis/ Segmental colitis associated with diverticulosis,
consider correlation with findings on prior colonoscopy.
6.  Aortic Atherosclerosis (636M1-9I7.7).

## 2023-03-14 ENCOUNTER — Telehealth: Payer: Self-pay | Admitting: Medical Oncology

## 2023-03-14 NOTE — Telephone Encounter (Signed)
Last note faxed

## 2023-03-21 ENCOUNTER — Telehealth: Payer: Self-pay | Admitting: Medical Oncology

## 2023-03-21 NOTE — Telephone Encounter (Signed)
FYI PCP wanted you to know his cxr today showed  fibrosis.

## 2023-03-30 ENCOUNTER — Inpatient Hospital Stay: Payer: Medicare Other | Attending: Internal Medicine

## 2023-03-30 ENCOUNTER — Ambulatory Visit (HOSPITAL_COMMUNITY)
Admission: RE | Admit: 2023-03-30 | Discharge: 2023-03-30 | Disposition: A | Discharge status: Home / Self Care | Payer: Medicare Other | Source: Ambulatory Visit | Attending: Internal Medicine | Admitting: Internal Medicine

## 2023-03-30 ENCOUNTER — Other Ambulatory Visit: Payer: Self-pay | Admitting: Internal Medicine

## 2023-03-30 DIAGNOSIS — R609 Edema, unspecified: Secondary | ICD-10-CM | POA: Insufficient documentation

## 2023-03-30 DIAGNOSIS — K4090 Unilateral inguinal hernia, without obstruction or gangrene, not specified as recurrent: Secondary | ICD-10-CM | POA: Insufficient documentation

## 2023-03-30 DIAGNOSIS — N200 Calculus of kidney: Secondary | ICD-10-CM | POA: Insufficient documentation

## 2023-03-30 DIAGNOSIS — K449 Diaphragmatic hernia without obstruction or gangrene: Secondary | ICD-10-CM | POA: Insufficient documentation

## 2023-03-30 DIAGNOSIS — I7121 Aneurysm of the ascending aorta, without rupture: Secondary | ICD-10-CM | POA: Diagnosis not present

## 2023-03-30 DIAGNOSIS — I2510 Atherosclerotic heart disease of native coronary artery without angina pectoris: Secondary | ICD-10-CM | POA: Insufficient documentation

## 2023-03-30 DIAGNOSIS — C7800 Secondary malignant neoplasm of unspecified lung: Secondary | ICD-10-CM | POA: Insufficient documentation

## 2023-03-30 DIAGNOSIS — Z885 Allergy status to narcotic agent status: Secondary | ICD-10-CM | POA: Insufficient documentation

## 2023-03-30 DIAGNOSIS — Z85850 Personal history of malignant neoplasm of thyroid: Secondary | ICD-10-CM | POA: Insufficient documentation

## 2023-03-30 DIAGNOSIS — I700 Atherosclerosis of aorta: Secondary | ICD-10-CM | POA: Insufficient documentation

## 2023-03-30 DIAGNOSIS — R0989 Other specified symptoms and signs involving the circulatory and respiratory systems: Secondary | ICD-10-CM | POA: Insufficient documentation

## 2023-03-30 DIAGNOSIS — K8020 Calculus of gallbladder without cholecystitis without obstruction: Secondary | ICD-10-CM | POA: Diagnosis not present

## 2023-03-30 DIAGNOSIS — Z882 Allergy status to sulfonamides status: Secondary | ICD-10-CM | POA: Insufficient documentation

## 2023-03-30 DIAGNOSIS — C641 Malignant neoplasm of right kidney, except renal pelvis: Secondary | ICD-10-CM | POA: Diagnosis not present

## 2023-03-30 DIAGNOSIS — C7802 Secondary malignant neoplasm of left lung: Secondary | ICD-10-CM | POA: Insufficient documentation

## 2023-03-30 DIAGNOSIS — K5730 Diverticulosis of large intestine without perforation or abscess without bleeding: Secondary | ICD-10-CM | POA: Insufficient documentation

## 2023-03-30 DIAGNOSIS — C649 Malignant neoplasm of unspecified kidney, except renal pelvis: Secondary | ICD-10-CM

## 2023-03-30 DIAGNOSIS — Z860101 Personal history of adenomatous and serrated colon polyps: Secondary | ICD-10-CM | POA: Insufficient documentation

## 2023-03-30 DIAGNOSIS — I509 Heart failure, unspecified: Secondary | ICD-10-CM | POA: Insufficient documentation

## 2023-03-30 DIAGNOSIS — Z79890 Hormone replacement therapy (postmenopausal): Secondary | ICD-10-CM | POA: Insufficient documentation

## 2023-03-30 DIAGNOSIS — C7801 Secondary malignant neoplasm of right lung: Secondary | ICD-10-CM | POA: Diagnosis not present

## 2023-03-30 DIAGNOSIS — R590 Localized enlarged lymph nodes: Secondary | ICD-10-CM | POA: Insufficient documentation

## 2023-03-30 DIAGNOSIS — Z85820 Personal history of malignant melanoma of skin: Secondary | ICD-10-CM | POA: Insufficient documentation

## 2023-03-30 DIAGNOSIS — Z905 Acquired absence of kidney: Secondary | ICD-10-CM | POA: Insufficient documentation

## 2023-03-30 DIAGNOSIS — Z79899 Other long term (current) drug therapy: Secondary | ICD-10-CM | POA: Insufficient documentation

## 2023-03-30 LAB — CBC WITH DIFFERENTIAL (CANCER CENTER ONLY)
Abs Immature Granulocytes: 0.02 10*3/uL (ref 0.00–0.07)
Basophils Absolute: 0 10*3/uL (ref 0.0–0.1)
Basophils Relative: 0 %
Eosinophils Absolute: 0.5 10*3/uL (ref 0.0–0.5)
Eosinophils Relative: 6 %
HCT: 42 % (ref 39.0–52.0)
Hemoglobin: 13.8 g/dL (ref 13.0–17.0)
Immature Granulocytes: 0 %
Lymphocytes Relative: 37 %
Lymphs Abs: 2.9 10*3/uL (ref 0.7–4.0)
MCH: 30.3 pg (ref 26.0–34.0)
MCHC: 32.9 g/dL (ref 30.0–36.0)
MCV: 92.1 fL (ref 80.0–100.0)
Monocytes Absolute: 0.8 10*3/uL (ref 0.1–1.0)
Monocytes Relative: 10 %
Neutro Abs: 3.7 10*3/uL (ref 1.7–7.7)
Neutrophils Relative %: 47 %
Platelet Count: 267 10*3/uL (ref 150–400)
RBC: 4.56 MIL/uL (ref 4.22–5.81)
RDW: 13.5 % (ref 11.5–15.5)
WBC Count: 7.9 10*3/uL (ref 4.0–10.5)
nRBC: 0 % (ref 0.0–0.2)

## 2023-03-30 LAB — CMP (CANCER CENTER ONLY)
ALT: 24 U/L (ref 0–44)
AST: 28 U/L (ref 15–41)
Albumin: 5 g/dL (ref 3.5–5.0)
Alkaline Phosphatase: 64 U/L (ref 38–126)
Anion gap: 9 (ref 5–15)
BUN: 33 mg/dL — ABNORMAL HIGH (ref 8–23)
CO2: 30 mmol/L (ref 22–32)
Calcium: 9.8 mg/dL (ref 8.9–10.3)
Chloride: 101 mmol/L (ref 98–111)
Creatinine: 1.83 mg/dL — ABNORMAL HIGH (ref 0.61–1.24)
GFR, Estimated: 38 mL/min — ABNORMAL LOW (ref >60)
Glucose, Bld: 101 mg/dL — ABNORMAL HIGH (ref 70–99)
Potassium: 4.4 mmol/L (ref 3.5–5.1)
Sodium: 140 mmol/L (ref 135–145)
Total Bilirubin: 0.8 mg/dL (ref <1.2)
Total Protein: 7.5 g/dL (ref 6.5–8.1)

## 2023-04-03 ENCOUNTER — Inpatient Hospital Stay: Payer: Medicare Other | Admitting: Internal Medicine

## 2023-04-03 VITALS — BP 167/76 | HR 60 | Temp 98.3°F | Resp 18 | Ht 67.0 in | Wt 157.1 lb

## 2023-04-03 DIAGNOSIS — Z885 Allergy status to narcotic agent status: Secondary | ICD-10-CM | POA: Diagnosis not present

## 2023-04-03 DIAGNOSIS — Z79890 Hormone replacement therapy (postmenopausal): Secondary | ICD-10-CM | POA: Diagnosis not present

## 2023-04-03 DIAGNOSIS — Z85820 Personal history of malignant melanoma of skin: Secondary | ICD-10-CM | POA: Diagnosis not present

## 2023-04-03 DIAGNOSIS — C7800 Secondary malignant neoplasm of unspecified lung: Secondary | ICD-10-CM | POA: Diagnosis not present

## 2023-04-03 DIAGNOSIS — K8020 Calculus of gallbladder without cholecystitis without obstruction: Secondary | ICD-10-CM | POA: Diagnosis not present

## 2023-04-03 DIAGNOSIS — Z79899 Other long term (current) drug therapy: Secondary | ICD-10-CM | POA: Diagnosis not present

## 2023-04-03 DIAGNOSIS — I2510 Atherosclerotic heart disease of native coronary artery without angina pectoris: Secondary | ICD-10-CM | POA: Diagnosis not present

## 2023-04-03 DIAGNOSIS — K449 Diaphragmatic hernia without obstruction or gangrene: Secondary | ICD-10-CM | POA: Diagnosis not present

## 2023-04-03 DIAGNOSIS — N200 Calculus of kidney: Secondary | ICD-10-CM | POA: Diagnosis not present

## 2023-04-03 DIAGNOSIS — Z85850 Personal history of malignant neoplasm of thyroid: Secondary | ICD-10-CM | POA: Diagnosis not present

## 2023-04-03 DIAGNOSIS — R0989 Other specified symptoms and signs involving the circulatory and respiratory systems: Secondary | ICD-10-CM | POA: Diagnosis not present

## 2023-04-03 DIAGNOSIS — Z882 Allergy status to sulfonamides status: Secondary | ICD-10-CM | POA: Diagnosis not present

## 2023-04-03 DIAGNOSIS — I7121 Aneurysm of the ascending aorta, without rupture: Secondary | ICD-10-CM | POA: Diagnosis not present

## 2023-04-03 DIAGNOSIS — K4090 Unilateral inguinal hernia, without obstruction or gangrene, not specified as recurrent: Secondary | ICD-10-CM | POA: Diagnosis not present

## 2023-04-03 DIAGNOSIS — C649 Malignant neoplasm of unspecified kidney, except renal pelvis: Secondary | ICD-10-CM | POA: Diagnosis present

## 2023-04-03 DIAGNOSIS — R609 Edema, unspecified: Secondary | ICD-10-CM | POA: Diagnosis not present

## 2023-04-03 DIAGNOSIS — I700 Atherosclerosis of aorta: Secondary | ICD-10-CM | POA: Diagnosis not present

## 2023-04-03 DIAGNOSIS — K5730 Diverticulosis of large intestine without perforation or abscess without bleeding: Secondary | ICD-10-CM | POA: Diagnosis not present

## 2023-04-03 DIAGNOSIS — C641 Malignant neoplasm of right kidney, except renal pelvis: Secondary | ICD-10-CM | POA: Diagnosis not present

## 2023-04-03 DIAGNOSIS — Z860101 Personal history of adenomatous and serrated colon polyps: Secondary | ICD-10-CM | POA: Diagnosis not present

## 2023-04-03 DIAGNOSIS — I509 Heart failure, unspecified: Secondary | ICD-10-CM | POA: Diagnosis not present

## 2023-04-03 DIAGNOSIS — Z905 Acquired absence of kidney: Secondary | ICD-10-CM | POA: Diagnosis not present

## 2023-04-03 NOTE — Progress Notes (Signed)
[facility] [phone]   Fax:([phone]  OFFICE PROGRESS NOTE  Christopher Quitter, MD [address] [place] [address]  DIAGNOSIS: Stage I (T1a, N0, M0) clear-cell renal cell carcinoma diagnosed in 2022.  Now with suspicious pulmonary metastasis  PRIOR THERAPY: Status post robotic assisted laparoscopic right radical nephrectomy on June 26, 2020 and the final pathology showed clear-cell renal cell carcinoma nuclear grade 2 measuring 4.0 cm.  CURRENT THERAPY: Observation  INTERVAL HISTORY: Christopher Mosley 77 y.o. male returns to the clinic today for follow-up visit.Discussed the use of AI scribe software for clinical note transcription with the patient, who gave verbal consent to proceed.  History of Present Illness   The patient, a 77 year old with a history of primary renal cell carcinoma, underwent a successful nephrectomy in February 2022. He has been under surveillance for the cancer since then. However, recent imaging has shown the presence of pulmonary nodules on both sides of the lung, which have slightly increased in size since the last scan three months ago.  In terms of general health, the patient reports feeling good with no major complaints. However, he experienced chest congestion after a recent trip to [place]. An initial X-ray suggested fluid on the lungs, raising concerns of congestive heart failure. A subsequent X-ray showed improvement, but cystic changes in the lungs were noted. A consultation with a cardiologist concluded that there was no significant issue.  The patient also reported a problem with edema in his legs, for which he has been prescribed torsemide by his cardiologist. There were no other reported symptoms such as chest pain, breathing issues, nausea, vomiting, or diarrhea.       MEDICAL HISTORY: Past Medical History:  Diagnosis Date   Arthritis    Colon polyps 09/08/2017   q 5 years; need records   Complication  of anesthesia    Heart murmur    History of hepatitis B    Hyperlipemia    Hypertension    Hypothyroidism    Lesion of vocal cord 02/06/2017   Right granuloma; resolved on f/u   Melanoma (HCC)    Papillary thyroid carcinoma (HCC)    s/p thyroidectomy,kidney,melanomas(2)   PONV (postoperative nausea and vomiting)    Urine incontinence     ALLERGIES:  is allergic to codeine and sulfa antibiotics.  MEDICATIONS:  Current Outpatient Medications  Medication Sig Dispense Refill   amLODipine (NORVASC) 5 MG tablet Take 5 mg by mouth daily.     levothyroxine (SYNTHROID) 75 MCG tablet TAKE 1 TABLET BY MOUTH ONCE DAILY BEFORE BREAKFAST (Patient taking differently: Take 75 mcg by mouth daily before breakfast.) 90 tablet 3   rosuvastatin (CRESTOR) 20 MG tablet Take 20 mg by mouth at bedtime.     testosterone cypionate (DEPOTESTOSTERONE CYPIONATE) 200 MG/ML injection INJECT 0.75 ML INTO MUSCLE EVERY 30 DAYS (Patient taking differently: Inject 150 mg into the muscle every 28 (twenty-eight) days.) 1 mL 3   No current facility-administered medications for this visit.    SURGICAL HISTORY:  Past Surgical History:  Procedure Laterality Date   BLADDER SURGERY  2016   BLALOCK PROCEDURE  1954   COLONOSCOPY     multiple - hx adenomas 2010 and 2015   CYSTOSCOPY N/A 06/26/2020   Procedure: FLEXIBLE CYSTOSCOPY FOR BLADDER STONE AND PLACEMENT OF FOLEY;  Surgeon: Jannifer Hick, MD;  Location: [facility];  Service: Urology;  Laterality: N/A;   MELANOMA EXCISION     Pottts shunt  1957   PULMONARY VALVE REPLACEMENT  10/21/2016   pig valve   ROBOTIC ASSITED PARTIAL NEPHRECTOMY Right 06/26/2020   Procedure: XI ROBOTIC ASSITED LAPAROSCOPIC RADICAL NEPHRECTOMY;  Surgeon: Jannifer Hick, MD;  Location: [facility];  Service: Urology;  Laterality: Right;   TETRALOGY OF FALLOT REPAIR  1961   TOTAL KNEE ARTHROPLASTY Left 2016   TOTAL THYROIDECTOMY  2002    REVIEW OF SYSTEMS:  Constitutional: positive for fatigue Eyes:  negative Ears, nose, mouth, throat, and face: negative Respiratory: negative Cardiovascular: negative Gastrointestinal: negative Genitourinary:negative Integument/breast: negative Hematologic/lymphatic: negative Musculoskeletal:negative Neurological: negative Behavioral/Psych: negative Endocrine: negative Allergic/Immunologic: negative   PHYSICAL EXAMINATION: General appearance: alert, cooperative, fatigued, and no distress Head: Normocephalic, without obvious abnormality, atraumatic Neck: no adenopathy, no JVD, supple, symmetrical, trachea midline, and thyroid not enlarged, symmetric, no tenderness/mass/nodules Lymph nodes: Cervical, supraclavicular, and axillary nodes normal. Resp: clear to auscultation bilaterally Back: symmetric, no curvature. ROM normal. No CVA tenderness. Cardio: regular rate and rhythm, S1, S2 normal, no murmur, click, rub or gallop GI: soft, non-tender; bowel sounds normal; no masses,  no organomegaly Extremities: extremities normal, atraumatic, no cyanosis or edema Neurologic: Alert and oriented X 3, normal strength and tone. Normal symmetric reflexes. Normal coordination and gait  ECOG PERFORMANCE STATUS: 1 - Symptomatic but completely ambulatory  Blood pressure (!) 179/72, pulse 60, temperature 98.3 F (36.8 C), temperature source Temporal, resp. rate 18, height 5\' 7"  (1.702 m), weight 157 lb 1.6 oz (71.3 kg), SpO2 96%.  LABORATORY DATA: Lab Results  Component Value Date   WBC 7.9 03/30/2023   HGB 13.8 03/30/2023   HCT 42.0 03/30/2023   MCV 92.1 03/30/2023   PLT 267 03/30/2023      Chemistry      Component Value Date/Time   NA 140 03/30/2023 0939   K 4.4 03/30/2023 0939   CL 101 03/30/2023 0939   CO2 30 03/30/2023 0939   BUN 33 (H) 03/30/2023 0939   CREATININE 1.83 (H) 03/30/2023 0939      Component Value Date/Time   CALCIUM 9.8 03/30/2023 0939   ALKPHOS 64 03/30/2023 0939   AST 28 03/30/2023 0939   ALT 24 03/30/2023 0939   BILITOT  0.8 03/30/2023 0939       RADIOGRAPHIC STUDIES: CT CHEST ABDOMEN PELVIS WO CONTRAST  Result Date: 04/03/2023 CLINICAL DATA:  Staging of renal cell carcinoma, right nephrectomy 2 years ago. Asymptomatic. * Tracking Code: BO * EXAM: CT CHEST, ABDOMEN AND PELVIS WITHOUT CONTRAST TECHNIQUE: Multidetector CT imaging of the chest, abdomen and pelvis was performed following the standard protocol without IV contrast. RADIATION DOSE REDUCTION: This exam was performed according to the departmental dose-optimization program which includes automated exposure control, adjustment of the mA and/or kV according to patient size and/or use of iterative reconstruction technique. COMPARISON:  09/22/2022 FINDINGS: CT CHEST FINDINGS Cardiovascular: Aortic atherosclerosis. Ascending aortic dilatation at 4.4 cm on 28/2 is similar. Tortuous descending thoracic aorta. Moderate cardiomegaly. Aortic valve calcification. Multivessel coronary artery atherosclerosis. Pulmonary valve replacement with pulmonary artery enlargement, relatively similar. Mediastinum/Nodes: Thyroidectomy. No supraclavicular adenopathy. Upper normal subcarinal node at 1.3 cm is unchanged. Hilar regions poorly evaluated without intravenous contrast. Tiny hiatal hernia. Lungs/Pleura: Bilateral pleural thickening with right-sided pleural calcification. The right pleural thickening is similar and the left pleural thickening is new. Bilateral pulmonary nodules. Index posterior right middle lobe spiculated nodule measures 9 x 8 mm on 73/4 versus 7 x 8 mm on the prior. Index subpleural right lower lobe nodule measures 4 mm on  115/4, similar. Index left upper lobe pulmonary nodule measures 10 x 10 mm on 36/4 versus 9 x 9 mm when remeasured in a similar fashion on the prior. Index anterior left lower lobe nodule measures 5 mm on 94/4 and is similar. More posterior and lateral subpleural 7 mm nodule is unchanged on 113/4. Musculoskeletal: Included within the abdomen  pelvic section. CT ABDOMEN PELVIS FINDINGS Hepatobiliary: Normal noncontrast appearance of the liver. Dependent gallstones without acute cholecystitis or biliary duct dilatation. Pancreas: Normal, without mass or ductal dilatation. Spleen: Normal in size, without focal abnormality. Adrenals/Urinary Tract: Normal adrenal glands. Right nephrectomy, without local recurrence. Punctate lower pole left renal collecting system calculus. Fluid density left renal lesions of maximally 3.5 cm are incompletely characterized but likely cysts. Parenchymal calcification in the upper pole left kidney of 3 mm may be dystrophic or related to a tiny complex cyst. No hydronephrosis. Normal urinary bladder. Stomach/Bowel: Normal remainder of the stomach. Extensive colonic diverticulosis. Normal terminal ileum and appendix. Normal small bowel. Vascular/Lymphatic: Advanced aortic and branch vessel atherosclerosis. No abdominopelvic adenopathy. Reproductive: Normal prostate. Other: New small volume pelvic fluid. Small fat containing left inguinal hernia. Musculoskeletal: Prior median sternotomy. IMPRESSION: CT CHEST IMPRESSION 1. Pulmonary metastasis are primarily similar. 2 of the larger nodules have minimally enlarged, suggesting mild progression. 2. Similar borderline mediastinal adenopathy, most likely reactive. 3. Ascending aortic aneurysm, similar at 4.4 cm. Recommend annual imaging followup by CTA or MRA. This recommendation follows 2010 ACCF/AHA/AATS/ACR/ASA/SCA/SCAI/SIR/STS/SVM Guidelines for the Diagnosis and Management of Patients with Thoracic Aortic Disease. Circulation. 2010; 121: Z610-R604. Aortic aneurysm NOS (ICD10-I71.9) 4. Pulmonary artery enlargement suggests pulmonary arterial hypertension. 5. Coronary artery atherosclerosis. Aortic Atherosclerosis (ICD10-I70.0). CT ABDOMEN AND PELVIS IMPRESSION 1. Right nephrectomy, without recurrent or metastatic disease. 2. Left nephrolithiasis 3. Cholelithiasis 4. New small volume  pelvic fluid, nonspecific. Electronically Signed   By: Jeronimo Greaves M.D.   On: 04/03/2023 11:38    ASSESSMENT AND PLAN: This is a very pleasant 77  years old white male with Stage I (T1a, N0, M0) clear-cell renal cell carcinoma diagnosed in 2022. He is status post robotic assisted laparoscopic right radical nephrectomy on June 26, 2020 and the final pathology showed clear-cell renal cell carcinoma nuclear grade 2 measuring 4.0 cm.    Pulmonary Nodules Renal cell carcinoma status post nephrectomy (February 2022). Follow-up imaging shows slight increase in size of pulmonary nodules over the past three months. Differential includes metastasis, new primary lung cancer, or benign nodules. Asymptomatic with no chest pain, dyspnea, nausea, vomiting, or diarrhea. Discussed bronchoscopy and biopsy to determine nodule etiology. Explained monitoring for benign nodules and further treatment for malignancy. - Refer to pulmonologist for bronchoscopy and biopsy - Schedule follow-up in 3-4 weeks to discuss biopsy results and management  Edema Leg edema managed by cardiologist with torsemide. No new symptoms reported. - Continue torsemide as prescribed by cardiologist  General Health Maintenance Recent travel to [place] with chest congestion evaluated by primary care physician and cardiologist. Congestive heart failure ruled out by cardiologist.  Follow-up - Pulmonologist to contact within 1-2 weeks - Follow-up with hematology/oncology in 3-4 weeks.   The patient was advised to call immediately if he has any other concerning symptoms in the interval. The patient voices understanding of current disease status and treatment options and is in agreement with the current care plan.  All questions were answered. The patient knows to call the clinic with any problems, questions or concerns. We can certainly see the patient much sooner if necessary.  The total time spent in the appointment was 30  minutes.  Disclaimer: This note was dictated with voice recognition software. Similar sounding words can inadvertently be transcribed and may not be corrected upon review.

## 2023-04-04 NOTE — Addendum Note (Signed)
Addended by: Charma Igo on: 04/04/2023 10:19 AM   Modules accepted: Orders

## 2023-04-05 ENCOUNTER — Telehealth: Payer: Self-pay | Admitting: Emergency Medicine

## 2023-04-05 NOTE — Telephone Encounter (Signed)
Urgent referral received from Dr Shirline Frees. He is requesting biopsy of nodules. CT completed 03/30/23. Please advise when patient should be seen as next available isnt until 1/22 for the MDs. Maralyn Sago has openings in December, but permission is needed prior to scheduling with her. Please advise

## 2023-04-10 NOTE — Telephone Encounter (Signed)
Patient scheduled to see Dr Delton Coombes on 12/6 @ 10:30. Nothing further needed.

## 2023-04-10 NOTE — Telephone Encounter (Signed)
Agree - I think if SG can see soonest then we should schedule there.

## 2023-04-14 ENCOUNTER — Encounter (HOSPITAL_COMMUNITY): Payer: Self-pay | Admitting: Emergency Medicine

## 2023-04-14 ENCOUNTER — Encounter: Payer: Self-pay | Admitting: Emergency Medicine

## 2023-04-14 ENCOUNTER — Ambulatory Visit: Payer: Medicare Other | Admitting: Emergency Medicine

## 2023-04-14 VITALS — BP 152/78 | HR 74 | Resp 16 | Ht 67.0 in | Wt 157.0 lb

## 2023-04-14 DIAGNOSIS — R0602 Shortness of breath: Secondary | ICD-10-CM | POA: Insufficient documentation

## 2023-04-14 DIAGNOSIS — R918 Other nonspecific abnormal finding of lung field: Secondary | ICD-10-CM | POA: Diagnosis not present

## 2023-04-14 NOTE — Progress Notes (Incomplete)
PCP - Dorinda Hill MD Cardiologist - ***  EKG - *** Chest x-ray - *** ECHO - *** Cardiac Cath - ***  Sleep Study- CPAP - ***  DM - Fasting Blood Sugar:  *** Checks Blood Sugar:  ***/day  Blood Thinner Instructions: *** Aspirin Instructions: ***  ERAS Protcol - *** COVID TEST- ***   -------------  SDW INSTRUCTIONS:  Your procedure is scheduled on ***. Please report to [facility] Main Entrance "A" at *** A.M., and check in at the Admitting office. Call this number if you have problems the morning of surgery: [phone]   Remember: Do not eat *** after midnight the night before your surgery  You may drink clear liquids until *** the morning of your surgery.   Clear liquids allowed are: Water, Non-Citrus Juices (without pulp), Carbonated Beverages, Clear Tea, Black Coffee Only, and Gatorade   Medications to take morning of surgery with a sip of water include: ***  As of today, STOP taking any Aspirin (unless otherwise instructed by your surgeon), Aleve, Naproxen, Ibuprofen, Motrin, Advil, Goody's, BC's, all herbal medications, fish oil, and all vitamins.    The Morning of Surgery Do not wear jewelry, make-up or nail polish. Do not wear lotions, powders, or perfumes/colognes, or deodorant Do not bring valuables to the hospital. [facility] is not responsible for any belongings or valuables.  If you are a smoker, DO NOT Smoke 24 hours prior to surgery  If you wear a CPAP at night please bring your mask the morning of surgery   Remember that you must have someone to transport you home after your surgery, and remain with you for 24 hours if you are discharged the same day.  Please bring cases for contacts, glasses, hearing aids, dentures or bridgework because it cannot be worn into surgery.   Patients discharged the day of surgery will not be allowed to drive home.   Please shower the NIGHT BEFORE/MORNING OF SURGERY (use antibacterial soap like DIAL soap if possible).  Wear comfortable clothes the morning of surgery. Oral Hygiene is also important to reduce your risk of infection.  Remember - BRUSH YOUR TEETH THE MORNING OF SURGERY WITH YOUR REGULAR TOOTHPASTE  Patient denies shortness of breath, fever, cough and chest pain.

## 2023-04-14 NOTE — Assessment & Plan Note (Signed)
Pulmonary Nodules Enlarging pulmonary nodules on surveillance imaging in a patient with a history of multiple malignancies including stage 1 clear cell renal cell carcinoma, papillary thyroid carcinoma, and melanoma. The nodules are suspicious for metastatic disease. -Schedule bronchoscopy with biopsy for Monday, 04/17/2023. -Stop aspirin 81mg  daily prior to procedure. -May need a super D CT scan on the day of the procedure if the previous scan is not adequate for navigation.

## 2023-04-14 NOTE — Anesthesia Preprocedure Evaluation (Signed)
Anesthesia Evaluation  Patient identified by MRN, date of birth, ID band Patient awake    Reviewed: Allergy & Precautions, NPO status , Patient's Chart, lab work & pertinent test results  History of Anesthesia Complications (+) history of anesthetic complications  Airway Mallampati: II  TM Distance: >3 FB Neck ROM: Full    Dental no notable dental hx. (+) Teeth Intact, Dental Advisory Given   Pulmonary neg pulmonary ROS   Pulmonary exam normal breath sounds clear to auscultation       Cardiovascular hypertension, + CABG  Normal cardiovascular exam+ Valvular Problems/Murmurs  Rhythm:Regular Rate:Normal  Echo 09/15/22 ([facility]) Conclusions                           - ToF s/p complete repair and then PVR with bioprosthesis.               - Normal left ventricular size and systolic function.                 - Normal right ventricular size and systolic function.                 - Well seated pulmonary bioprosthesis with trivial regurgitation and no stenosis    (pk/mn gradients 11/8 mmHg).             - Aortic valve sclerosis without stenosis. Trivial regurgitation.           - The ventricular septum appears intact.      - Compared to the prior study on 12/17/20, no significant changes seen.                           Neuro/Psych negative neurological ROS     GI/Hepatic negative GI ROS, Neg liver ROS,,,  Endo/Other  Hypothyroidism    Renal/GU Renal InsufficiencyRenal disease     Musculoskeletal  (+) Arthritis ,    Abdominal   Peds  Hematology   Anesthesia Other Findings   Reproductive/Obstetrics                             Anesthesia Physical Anesthesia Plan  ASA: 3  Anesthesia Plan: General   Post-op Pain Management:    Induction: Intravenous  PONV Risk Score and  Plan: 3 and Treatment may vary due to age or medical condition and Ondansetron  Airway Management Planned: Oral ETT  Additional Equipment: None  Intra-op Plan:   Post-operative Plan: Extubation in OR  Informed Consent: I have reviewed the patients History and Physical, chart, labs and discussed the procedure including the risks, benefits and alternatives for the proposed anesthesia with the patient or authorized representative who has indicated his/her understanding and acceptance.     Dental advisory given  Plan Discussed with: CRNA and Anesthesiologist  Anesthesia Plan Comments: (See PAT note written 04/14/2023 by Shonna Chock, PA-C.  )       Anesthesia Quick Evaluation

## 2023-04-14 NOTE — Progress Notes (Signed)
Anesthesia Chart Review: Christopher Mosley  Case: [id number] Date/Time: 04/17/23 1230   Procedures:      ROBOTIC ASSISTED NAVIGATIONAL BRONCHOSCOPY (Bilateral)     VIDEO BRONCHOSCOPY WITH ENDOBRONCHIAL ULTRASOUND (Bilateral)   Anesthesia type: General   Pre-op diagnosis: BILATERAL PULMONARY NODULES, MEDIASTINAL ADENOPATHY   Location: [facility] / [facility]   Surgeons: [name], MD       DISCUSSION: Patient is a 77 year old male scheduled for the above procedure.  History includes never smoker, postoperative N/V, hypertension, hyperlipidemia, tetralogy of Fallot (right Cathlyn Parsons Taussig-Thomas shunt 08/23/52, Potts shunt 07/28/55, and complete repair reportedly with transannular patch 02/08/60 at 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  He is followed at the [facility] by Dr. Delrae Sawyers for history of Tetralogy of Fallot, s/p repair and surgical PVR. Last visit 03/23/23 after PCP referred him back for abnormal chest imaging and LE edema. Notes suggest that CXR following severe bronchitis after a family trip to [place] showed "fluid" and "fibrosis". He had known stable pulmonary nodules dating back to at least 2022. LE edema improved with torsemide. Echo in May 2024 showed normal BiV systolic function and well seated and normally functioning pulmonic valve.  At visit, cardiologist  felt he looked "great". No medication changes. He was encouraged to remain active. Plan to keep his previously one year follow-up with echo.   Follows with oncologist Dr. Arbutus Ped. He referred patient urgently to Dr. Delton Coombes for enlarging pulmonary nodules on surveillance imaging for RCC. Nodules concerning for metastatic disease. Evaluated on 04/14/23 and above procedure scheduled for 04/17/23. ASA held. Per notes, he may require super D CT on the day of procedure if the previous scan is not adequate for navigation.    VS:  Wt Readings from Last 3 Encounters:  04/14/23 71.2 kg  04/03/23 71.3 kg  09/29/22 70.3 kg   BP Readings from Last 3 Encounters:  04/14/23 (!) 152/78  04/03/23 (!) 167/76  09/29/22 (!) 179/83   Pulse Readings from Last 3 Encounters:  04/14/23 74  04/03/23 60  09/29/22 63     PROVIDERS: Melida Quitter, MD is PCP  Delrae Sawyers, MD is cardiologist ([facility] at the [facility] location)  Jettie Pagan, MD is urologist Si Gaul, MD is HEM-ONC   LABS: Most recent lab results in [facility] include: Lab Results  Component Value Date   WBC 7.9 03/30/2023   HGB 13.8 03/30/2023   HCT 42.0 03/30/2023   PLT 267 03/30/2023   GLUCOSE 101 (H) 03/30/2023   ALT 24 03/30/2023   AST 28 03/30/2023   NA 140 03/30/2023   K 4.4 03/30/2023   CL 101 03/30/2023   CREATININE 1.83 (H) 03/30/2023   BUN 33 (H) 03/30/2023   CO2 30 03/30/2023     IMAGES: CT Chest/Abd/Pelvis 03/30/23: CT CHEST IMPRESSION 1. Pulmonary metastasis are primarily similar. 2 of the larger nodules have minimally enlarged, suggesting mild progression. 2. Similar borderline mediastinal adenopathy, most likely reactive. 3. Ascending aortic aneurysm, similar at 4.4 cm. Recommend  annual imaging followup by CTA or MRA. This recommendation follows 2010 ACCF/AHA/AATS/ACR/ASA/SCA/SCAI/SIR/STS/SVM Guidelines for the Diagnosis and Management of Patients with  Thoracic Aortic Disease. Circulation. 2010; 121: I696-E952. Aortic aneurysm NOS (ICD10-I71.9) 4. Pulmonary artery enlargement suggests pulmonary arterial hypertension. 5. Coronary artery atherosclerosis. Aortic Atherosclerosis (ICD10-I70.0).  CT ABDOMEN AND PELVIS IMPRESSION 1. Right nephrectomy, without recurrent or metastatic disease. 2. Left nephrolithiasis 3. Cholelithiasis 4. New small volume pelvic fluid, nonspecific.    EKG: Last EKG narrative seen is fro 11/14/19 ([facility]):  Wide QRS rhythm  Abnormal ECG    CV: Echo 09/15/22 ([facility]) Conclusions                                                   - ToF s/p complete repair and then PVR with bioprosthesis.                            - Normal left ventricular size and systolic function.                                 - Normal right ventricular size and systolic function.                                - Well seated pulmonary bioprosthesis with trivial regurgitation and no stenosis      (pk/mn gradients 11/8 mmHg).                       - Aortic valve sclerosis without stenosis. Trivial regurgitation.                     - The ventricular septum appears intact.        - Compared to the prior study on 12/17/20, no significant changes seen.                                                   Past Medical History:  Diagnosis Date   Arthritis    Colon polyps 09/08/2017   q 5 years; need records   Complication of anesthesia    Heart murmur    History of hepatitis B    Hyperlipemia    Hypertension    Hypothyroidism    Lesion of vocal cord 02/06/2017   Right granuloma; resolved on f/u   Melanoma (HCC)    Papillary thyroid carcinoma (HCC)    s/p thyroidectomy,kidney,melanomas(2)   PONV (postoperative nausea and vomiting)    Urine incontinence     Past Surgical History:  Procedure Laterality Date   BLADDER SURGERY  2016   BLALOCK PROCEDURE  1954   COLONOSCOPY     multiple - hx adenomas 2010 and 2015   CYSTOSCOPY N/A  06/26/2020   Procedure: FLEXIBLE CYSTOSCOPY FOR BLADDER STONE AND PLACEMENT OF FOLEY;  Surgeon: Jannifer Hick, MD;  Location: [facility];  Service: Urology;  Laterality: N/A;   MELANOMA EXCISION     Pottts shunt  1957   PULMONARY VALVE REPLACEMENT  10/21/2016  pig valve   ROBOTIC ASSITED PARTIAL NEPHRECTOMY Right 06/26/2020   Procedure: XI ROBOTIC ASSITED LAPAROSCOPIC RADICAL NEPHRECTOMY;  Surgeon: Jannifer Hick, MD;  Location: [facility];  Service: Urology;  Laterality: Right;   TETRALOGY OF FALLOT REPAIR  1961   TOTAL KNEE ARTHROPLASTY Left 2016   TOTAL THYROIDECTOMY  2002    MEDICATIONS: No current facility-administered medications for this encounter.    amLODipine (NORVASC) 5 MG tablet   aspirin (ASPIRIN 81) 81 MG chewable tablet   hydrALAZINE (APRESOLINE) 50 MG tablet   HYDROcodone-acetaminophen (NORCO/VICODIN) 5-325 MG tablet   levothyroxine (SYNTHROID) 75 MCG tablet   rosuvastatin (CRESTOR) 20 MG tablet   torsemide (DEMADEX) 5 MG tablet    Shonna Chock, PA-C Surgical Short Stay/Anesthesiology [facility] Phone [phone] [facility] Phone [phone] 04/14/2023 4:17 PM

## 2023-04-14 NOTE — Assessment & Plan Note (Signed)
Some feeling of congestion, dyspnea although no cough.  May merit pulmonary function testing, further workup as we go forward.

## 2023-04-14 NOTE — H&P (View-Only) (Signed)
Subjective:    Patient ID: Christopher Mosley, male    DOB: 05/05/1946, 77 y.o.   MRN: [id number]  HPI The patient is a 77 year old individual with a complex medical history, including hypertension, hyperlipidemia, hepatitis B, and multiple malignancies. The malignancies include stage one clear cell renal cell carcinoma diagnosed in 2022, papillary thyroid carcinoma post-thyroidectomy, and melanoma. The patient has been under the care of an oncologist, Dr. Jerolyn Center, who has been conducting surveillance imaging. Recent imaging has identified enlarging pulmonary nodular disease on a CT scan of the chest.  The most recent CT scan of the chest, abdomen, and pelvis, conducted on 03/30/23, showed bilateral pleural thickening with some right-sided pleural calcification. There is a speculated 9mm right middle lobe nodule that has slightly increased in size compared to a previous scan from 09/22/18. Additional findings include a 4mm right lower lobe nodule, a slightly enlarged 10mm left upper lobe pulmonary nodule, and a 5mm left lower lobe nodule. There is also some borderline mediastinal adenopathy with a subcarotid node at 1.3cm. The patient is not on any anticoagulation but does take 81mg  of aspirin daily.  The patient has been following these pulmonary nodules with Dr. Arbutus Ped. The initial 90-day scan after kidney surgery showed three spots on the lungs and one on the liver. At that time, the radiologist suggested stage four renal cell cancer, but this was questioned by the patient's doctor. Since then, the patient has been undergoing regular scans. There has been no multiplication of the spots, and they have remained the same size. However, the most recent scan showed slight growth in two of the nodules, raising suspicion.  In addition to the pulmonary nodules, the patient has a history of tetralogy of flow, polio, and has undergone multiple surgeries, including pulmonary valve replacement in 2018. The  patient also had two melanomas and thyroid cancer, all of which were surgically removed. Recently, the patient has been experiencing breathing difficulties, described as air going over a baffle, which has been affecting his sleep. The patient denies any coughing or nasal congestion and drainage.    RADIOLOGY CT chest, abdomen, and pelvis: Bilateral pleural thickening with right side pleural calcification. Speculated 9 mm right middle lobe nodule, 4 mm right lower lobe nodule, 10 mm left upper lobe pulmonary nodule, 5 mm left lower lobe nodule. Borderline mediastinal adenopathy with 1.3 cm subcarotid node. (03/30/2023)   Review of Systems As per HPI  Past Medical History:  Diagnosis Date   Arthritis    Colon polyps 09/08/2017   q 5 years; need records   Complication of anesthesia    Heart murmur    History of hepatitis B    Hyperlipemia    Hypertension    Hypothyroidism    Lesion of vocal cord 02/06/2017   Right granuloma; resolved on f/u   Melanoma (HCC)    Papillary thyroid carcinoma (HCC)    s/p thyroidectomy,kidney,melanomas(2)   PONV (postoperative nausea and vomiting)    Urine incontinence      Family History  Problem Relation Age of Onset   Alcohol abuse Mother    Diabetes Mother    Depression Mother    Early death Mother    Hyperlipidemia Mother    Miscarriages / [place] Mother    Hypertension Mother    Stomach cancer Mother    Alcohol abuse Father    Diabetes Father    Early death Father    Heart attack Father    Heart disease Father  Hypertension Father    Pancreatic cancer Father    Arthritis Brother    Diabetes Brother    Stroke Brother    Renal cancer Brother    Early death Maternal Grandmother    Heart attack Maternal Grandmother    Alcohol abuse Maternal Grandfather    Heart attack Maternal Grandfather    Cancer Paternal Grandfather    Asthma Brother    Drug abuse Brother    Healthy Daughter    Healthy Son      Social History    Socioeconomic History   Marital status: Married    Spouse name: Not on file   Number of children: Not on file   Years of education: Not on file   Highest education level: Not on file  Occupational History   Occupation: Retired Airline pilot  Tobacco Use   Smoking status: Never   Smokeless tobacco: Never  Vaping Use   Vaping status: Never Used  Substance and Sexual Activity   Alcohol use: Yes    Comment: occas.   Drug use: Never   Sexual activity: Yes  Other Topics Concern   Not on file  Social History Narrative   Not on file   Social Determinants of Health   Financial Resource Strain: Not on file  Food Insecurity: Not on file  Transportation Needs: Not on file  Physical Activity: Not on file  Stress: Not on file  Social Connections: Not on file  Intimate Partner Violence: Not on file    The patient has never smoked and has no known exposure to tuberculosis or significant breathing exposures from work or hobbies. The patient has two cats and a dog and had a parakeet approximately 55 years ago.  Allergies  Allergen Reactions   Codeine Other (See Comments)    Syncope    Sulfa Antibiotics Diarrhea and Rash     Outpatient Medications Prior to Visit  Medication Sig Dispense Refill   amLODipine (NORVASC) 5 MG tablet Take 5 mg by mouth daily.     aspirin (ASPIRIN 81) 81 MG chewable tablet      hydrALAZINE (APRESOLINE) 50 MG tablet Take 50 mg by mouth 3 (three) times daily.     levothyroxine (SYNTHROID) 75 MCG tablet TAKE 1 TABLET BY MOUTH ONCE DAILY BEFORE BREAKFAST (Patient taking differently: Take 75 mcg by mouth daily before breakfast.) 90 tablet 3   rosuvastatin (CRESTOR) 20 MG tablet Take 20 mg by mouth at bedtime.     torsemide (DEMADEX) 5 MG tablet Take 5 mg by mouth daily.     Testosterone Cypionate 200 MG/ML KIT Inject 0.5 mL every 2 weeks by intramuscular route.     HYDROcodone-acetaminophen (NORCO/VICODIN) 5-325 MG tablet  (Patient not taking: Reported on  04/14/2023)     No facility-administered medications prior to visit.         Objective:   Physical Exam Vitals:   04/14/23 1028  BP: (!) 152/78  Pulse: 74  Resp: 16  SpO2: 97%  Weight: 157 lb (71.2 kg)  Height: 5\' 7"  (1.702 m)   Gen: Pleasant, well-nourished, in no distress,  normal affect  ENT: No lesions,  mouth clear,  oropharynx clear, no postnasal drip  Neck: No JVD, no stridor  Lungs: No use of accessory muscles, no crackles or wheezing on normal respiration, no wheeze on forced expiration  Cardiovascular: RRR, heart sounds normal, no murmur or gallops, B 1+ LE edema  Musculoskeletal: No deformities, no cyanosis or clubbing  Neuro: alert, awake, non  focal  Skin: Warm, no lesions or rash       Assessment & Plan:   Pulmonary nodules Pulmonary Nodules Enlarging pulmonary nodules on surveillance imaging in a patient with a history of multiple malignancies including stage 1 clear cell renal cell carcinoma, papillary thyroid carcinoma, and melanoma. The nodules are suspicious for metastatic disease. -Schedule bronchoscopy with biopsy for Monday, 04/17/2023. -Stop aspirin 81mg  daily prior to procedure. -May need a super D CT scan on the day of the procedure if the previous scan is not adequate for navigation.  Shortness of breath Some feeling of congestion, dyspnea although no cough.  May merit pulmonary function testing, further workup as we go forward.   Levy Pupa, MD, PhD 04/14/2023, 11:00 AM [facility] [phone] or if no answer before 7:00PM call [phone] For any issues after 7:00PM please call eLink [phone]

## 2023-04-14 NOTE — Progress Notes (Signed)
Subjective:    Patient ID: Christopher Mosley, male    DOB: 05/05/1946, 77 y.o.   MRN: [id number]  HPI The patient is a 77 year old individual with a complex medical history, including hypertension, hyperlipidemia, hepatitis B, and multiple malignancies. The malignancies include stage one clear cell renal cell carcinoma diagnosed in 2022, papillary thyroid carcinoma post-thyroidectomy, and melanoma. The patient has been under the care of an oncologist, Dr. Jerolyn Center, who has been conducting surveillance imaging. Recent imaging has identified enlarging pulmonary nodular disease on a CT scan of the chest.  The most recent CT scan of the chest, abdomen, and pelvis, conducted on 03/30/23, showed bilateral pleural thickening with some right-sided pleural calcification. There is a speculated 9mm right middle lobe nodule that has slightly increased in size compared to a previous scan from 09/22/18. Additional findings include a 4mm right lower lobe nodule, a slightly enlarged 10mm left upper lobe pulmonary nodule, and a 5mm left lower lobe nodule. There is also some borderline mediastinal adenopathy with a subcarotid node at 1.3cm. The patient is not on any anticoagulation but does take 81mg  of aspirin daily.  The patient has been following these pulmonary nodules with Dr. Arbutus Ped. The initial 90-day scan after kidney surgery showed three spots on the lungs and one on the liver. At that time, the radiologist suggested stage four renal cell cancer, but this was questioned by the patient's doctor. Since then, the patient has been undergoing regular scans. There has been no multiplication of the spots, and they have remained the same size. However, the most recent scan showed slight growth in two of the nodules, raising suspicion.  In addition to the pulmonary nodules, the patient has a history of tetralogy of flow, polio, and has undergone multiple surgeries, including pulmonary valve replacement in 2018. The  patient also had two melanomas and thyroid cancer, all of which were surgically removed. Recently, the patient has been experiencing breathing difficulties, described as air going over a baffle, which has been affecting his sleep. The patient denies any coughing or nasal congestion and drainage.    RADIOLOGY CT chest, abdomen, and pelvis: Bilateral pleural thickening with right side pleural calcification. Speculated 9 mm right middle lobe nodule, 4 mm right lower lobe nodule, 10 mm left upper lobe pulmonary nodule, 5 mm left lower lobe nodule. Borderline mediastinal adenopathy with 1.3 cm subcarotid node. (03/30/2023)   Review of Systems As per HPI  Past Medical History:  Diagnosis Date   Arthritis    Colon polyps 09/08/2017   q 5 years; need records   Complication of anesthesia    Heart murmur    History of hepatitis B    Hyperlipemia    Hypertension    Hypothyroidism    Lesion of vocal cord 02/06/2017   Right granuloma; resolved on f/u   Melanoma (HCC)    Papillary thyroid carcinoma (HCC)    s/p thyroidectomy,kidney,melanomas(2)   PONV (postoperative nausea and vomiting)    Urine incontinence      Family History  Problem Relation Age of Onset   Alcohol abuse Mother    Diabetes Mother    Depression Mother    Early death Mother    Hyperlipidemia Mother    Miscarriages / [place] Mother    Hypertension Mother    Stomach cancer Mother    Alcohol abuse Father    Diabetes Father    Early death Father    Heart attack Father    Heart disease Father  Hypertension Father    Pancreatic cancer Father    Arthritis Brother    Diabetes Brother    Stroke Brother    Renal cancer Brother    Early death Maternal Grandmother    Heart attack Maternal Grandmother    Alcohol abuse Maternal Grandfather    Heart attack Maternal Grandfather    Cancer Paternal Grandfather    Asthma Brother    Drug abuse Brother    Healthy Daughter    Healthy Son      Social History    Socioeconomic History   Marital status: Married    Spouse name: Not on file   Number of children: Not on file   Years of education: Not on file   Highest education level: Not on file  Occupational History   Occupation: Retired Airline pilot  Tobacco Use   Smoking status: Never   Smokeless tobacco: Never  Vaping Use   Vaping status: Never Used  Substance and Sexual Activity   Alcohol use: Yes    Comment: occas.   Drug use: Never   Sexual activity: Yes  Other Topics Concern   Not on file  Social History Narrative   Not on file   Social Determinants of Health   Financial Resource Strain: Not on file  Food Insecurity: Not on file  Transportation Needs: Not on file  Physical Activity: Not on file  Stress: Not on file  Social Connections: Not on file  Intimate Partner Violence: Not on file    The patient has never smoked and has no known exposure to tuberculosis or significant breathing exposures from work or hobbies. The patient has two cats and a dog and had a parakeet approximately 55 years ago.  Allergies  Allergen Reactions   Codeine Other (See Comments)    Syncope    Sulfa Antibiotics Diarrhea and Rash     Outpatient Medications Prior to Visit  Medication Sig Dispense Refill   amLODipine (NORVASC) 5 MG tablet Take 5 mg by mouth daily.     aspirin (ASPIRIN 81) 81 MG chewable tablet      hydrALAZINE (APRESOLINE) 50 MG tablet Take 50 mg by mouth 3 (three) times daily.     levothyroxine (SYNTHROID) 75 MCG tablet TAKE 1 TABLET BY MOUTH ONCE DAILY BEFORE BREAKFAST (Patient taking differently: Take 75 mcg by mouth daily before breakfast.) 90 tablet 3   rosuvastatin (CRESTOR) 20 MG tablet Take 20 mg by mouth at bedtime.     torsemide (DEMADEX) 5 MG tablet Take 5 mg by mouth daily.     Testosterone Cypionate 200 MG/ML KIT Inject 0.5 mL every 2 weeks by intramuscular route.     HYDROcodone-acetaminophen (NORCO/VICODIN) 5-325 MG tablet  (Patient not taking: Reported on  04/14/2023)     No facility-administered medications prior to visit.         Objective:   Physical Exam Vitals:   04/14/23 1028  BP: (!) 152/78  Pulse: 74  Resp: 16  SpO2: 97%  Weight: 157 lb (71.2 kg)  Height: 5\' 7"  (1.702 m)   Gen: Pleasant, well-nourished, in no distress,  normal affect  ENT: No lesions,  mouth clear,  oropharynx clear, no postnasal drip  Neck: No JVD, no stridor  Lungs: No use of accessory muscles, no crackles or wheezing on normal respiration, no wheeze on forced expiration  Cardiovascular: RRR, heart sounds normal, no murmur or gallops, B 1+ LE edema  Musculoskeletal: No deformities, no cyanosis or clubbing  Neuro: alert, awake, non  focal  Skin: Warm, no lesions or rash       Assessment & Plan:   Pulmonary nodules Pulmonary Nodules Enlarging pulmonary nodules on surveillance imaging in a patient with a history of multiple malignancies including stage 1 clear cell renal cell carcinoma, papillary thyroid carcinoma, and melanoma. The nodules are suspicious for metastatic disease. -Schedule bronchoscopy with biopsy for Monday, 04/17/2023. -Stop aspirin 81mg  daily prior to procedure. -May need a super D CT scan on the day of the procedure if the previous scan is not adequate for navigation.  Shortness of breath Some feeling of congestion, dyspnea although no cough.  May merit pulmonary function testing, further workup as we go forward.   Levy Pupa, MD, PhD 04/14/2023, 11:00 AM [facility] [phone] or if no answer before 7:00PM call [phone] For any issues after 7:00PM please call eLink [phone]

## 2023-04-14 NOTE — Progress Notes (Signed)
SDW call  Patient was given pre-op instructions over the phone. Patient verbalized understanding of instructions provided.     PCP - Dr. Dorinda Hill Cardiologist - Dr. Delrae Sawyers, [facility]:    PPM/ICD - denies Device Orders - na Rep Notified - na   Chest x-ray - na EKG -  DOS, 04/17/2023 Stress Test - ECHO - 09/15/2022 Cardiac Cath -   Sleep Study/sleep apnea/CPAP: denies  Non-diabetic  Blood Thinner Instructions: denies Aspirin Instructions:States the last dose was today 04/14/2023   ERAS Protcol - NPO  COVID TEST- na    Anesthesia review: Yes. HTN, high cholesterol, heart murmur, pig valve, tetrology of fallot repair as infant   Patient denies shortness of breath, fever, cough and chest pain over the phone call  Your procedure is scheduled on Monday April 17, 2023  Report to [facility] Main Entrance "A" at  1000  A.M., then check in with the Admitting office.  Call this number if you have problems the morning of surgery:  [phone]   If you have any questions prior to your surgery date call [phone]: Open Monday-Friday 8am-4pm If you experience any cold or flu symptoms such as cough, fever, chills, shortness of breath, etc. between now and your scheduled surgery, please notify us at the above number    Remember:  Do not eat or dink  after midnight the night before your surgery   Take these medicines the morning of surgery with A SIP OF WATER:  Amlodipine, hydralazine, levothyroxine,   As of today, STOP taking any Aleve, Naproxen, Ibuprofen, Motrin, Advil, Goody's, BC's, all herbal medications, fish oil, and all vitamins.

## 2023-04-14 NOTE — Patient Instructions (Signed)
VISIT SUMMARY:  During today's visit, we reviewed your recent CT scan results and discussed the findings of enlarging pulmonary nodules. We also went over your history of multiple malignancies, including renal cell carcinoma, thyroid carcinoma, and melanoma. Additionally, we touched on your chronic conditions such as hypertension, hyperlipidemia, and hepatitis B, as well as your history of tetralogy of Fallot and recent breathing difficulties.  YOUR PLAN:  -PULMONARY NODULES: Pulmonary nodules are small growths in the lungs that can be benign or a sign of cancer. Given your history of multiple malignancies, the enlarging nodules are suspicious for metastatic disease. We have scheduled a bronchoscopy with biopsy for Monday, 04/17/2023, to obtain a tissue sample for further analysis. Please stop taking your daily aspirin before the procedure. We may also consider a repeat CT scan on the day of the procedure if needed for better navigation. If your breathing issues persist, we might conduct pulmonary function tests in the future.   INSTRUCTIONS:  Please stop taking your daily aspirin before your bronchoscopy with biopsy scheduled for Monday, 04/17/2023. We may also consider a repeat CT scan on the day of the procedure if needed for better navigation. If your breathing issues persist, we might conduct pulmonary function tests in the future.

## 2023-04-17 ENCOUNTER — Ambulatory Visit (HOSPITAL_COMMUNITY): Payer: Medicare Other | Admitting: Vascular Surgery

## 2023-04-17 ENCOUNTER — Ambulatory Visit (HOSPITAL_COMMUNITY)
Admission: RE | Admit: 2023-04-17 | Discharge: 2023-04-17 | Disposition: A | Discharge status: Home / Self Care | Payer: Medicare Other | Attending: Emergency Medicine | Admitting: Emergency Medicine

## 2023-04-17 ENCOUNTER — Ambulatory Visit (HOSPITAL_COMMUNITY): Payer: Medicare Other

## 2023-04-17 ENCOUNTER — Encounter (HOSPITAL_COMMUNITY): Payer: Self-pay | Admitting: Emergency Medicine

## 2023-04-17 ENCOUNTER — Other Ambulatory Visit: Payer: Self-pay

## 2023-04-17 ENCOUNTER — Encounter (HOSPITAL_COMMUNITY)
Admission: RE | Disposition: A | Discharge status: Home / Self Care | Payer: Self-pay | Source: Home / Self Care | Attending: Emergency Medicine

## 2023-04-17 DIAGNOSIS — Z7982 Long term (current) use of aspirin: Secondary | ICD-10-CM | POA: Insufficient documentation

## 2023-04-17 DIAGNOSIS — Z85850 Personal history of malignant neoplasm of thyroid: Secondary | ICD-10-CM | POA: Diagnosis not present

## 2023-04-17 DIAGNOSIS — Z85820 Personal history of malignant melanoma of skin: Secondary | ICD-10-CM | POA: Diagnosis not present

## 2023-04-17 DIAGNOSIS — D3A090 Benign carcinoid tumor of the bronchus and lung: Secondary | ICD-10-CM | POA: Diagnosis not present

## 2023-04-17 DIAGNOSIS — I129 Hypertensive chronic kidney disease with stage 1 through stage 4 chronic kidney disease, or unspecified chronic kidney disease: Secondary | ICD-10-CM | POA: Insufficient documentation

## 2023-04-17 DIAGNOSIS — Z85528 Personal history of other malignant neoplasm of kidney: Secondary | ICD-10-CM | POA: Insufficient documentation

## 2023-04-17 DIAGNOSIS — Z8619 Personal history of other infectious and parasitic diseases: Secondary | ICD-10-CM | POA: Diagnosis not present

## 2023-04-17 DIAGNOSIS — E890 Postprocedural hypothyroidism: Secondary | ICD-10-CM | POA: Diagnosis not present

## 2023-04-17 DIAGNOSIS — E785 Hyperlipidemia, unspecified: Secondary | ICD-10-CM | POA: Diagnosis not present

## 2023-04-17 DIAGNOSIS — Z96652 Presence of left artificial knee joint: Secondary | ICD-10-CM | POA: Diagnosis not present

## 2023-04-17 DIAGNOSIS — Z905 Acquired absence of kidney: Secondary | ICD-10-CM | POA: Insufficient documentation

## 2023-04-17 DIAGNOSIS — Z8612 Personal history of poliomyelitis: Secondary | ICD-10-CM | POA: Insufficient documentation

## 2023-04-17 DIAGNOSIS — Z951 Presence of aortocoronary bypass graft: Secondary | ICD-10-CM | POA: Diagnosis not present

## 2023-04-17 DIAGNOSIS — R590 Localized enlarged lymph nodes: Secondary | ICD-10-CM | POA: Insufficient documentation

## 2023-04-17 DIAGNOSIS — Z8774 Personal history of (corrected) congenital malformations of heart and circulatory system: Secondary | ICD-10-CM | POA: Insufficient documentation

## 2023-04-17 DIAGNOSIS — N189 Chronic kidney disease, unspecified: Secondary | ICD-10-CM | POA: Insufficient documentation

## 2023-04-17 DIAGNOSIS — R918 Other nonspecific abnormal finding of lung field: Secondary | ICD-10-CM | POA: Diagnosis present

## 2023-04-17 HISTORY — PX: BRONCHIAL NEEDLE ASPIRATION BIOPSY: SHX5106

## 2023-04-17 HISTORY — PX: VIDEO BRONCHOSCOPY WITH ENDOBRONCHIAL ULTRASOUND: SHX6177

## 2023-04-17 HISTORY — PX: BRONCHIAL BRUSHINGS: SHX5108

## 2023-04-17 HISTORY — PX: BRONCHIAL BIOPSY: SHX5109

## 2023-04-17 SURGERY — BRONCHOSCOPY, WITH BIOPSY USING ELECTROMAGNETIC NAVIGATION
Anesthesia: General | Laterality: Bilateral

## 2023-04-17 MED ORDER — GLYCOPYRROLATE 0.2 MG/ML IJ SOLN
INTRAMUSCULAR | Status: DC | PRN
  Administered 2023-04-17: .2 mg via INTRAVENOUS

## 2023-04-17 MED ORDER — FENTANYL CITRATE (PF) 100 MCG/2ML IJ SOLN
INTRAMUSCULAR | Status: AC
  Filled 2023-04-17: qty 2

## 2023-04-17 MED ORDER — LACTATED RINGERS IV SOLN
INTRAVENOUS | Status: DC
Start: 2023-04-17 — End: 2023-04-18

## 2023-04-17 MED ORDER — ROCURONIUM BROMIDE 10 MG/ML (PF) SYRINGE
PREFILLED_SYRINGE | INTRAVENOUS | Status: DC | PRN
  Administered 2023-04-17: 40 mg via INTRAVENOUS
  Administered 2023-04-17: 50 mg via INTRAVENOUS

## 2023-04-17 MED ORDER — SUGAMMADEX SODIUM 200 MG/2ML IV SOLN
INTRAVENOUS | Status: DC | PRN
  Administered 2023-04-17: 200 mg via INTRAVENOUS

## 2023-04-17 MED ORDER — PHENYLEPHRINE 80 MCG/ML (10ML) SYRINGE FOR IV PUSH (FOR BLOOD PRESSURE SUPPORT)
PREFILLED_SYRINGE | INTRAVENOUS | Status: DC | PRN
  Administered 2023-04-17: 160 ug via INTRAVENOUS

## 2023-04-17 MED ORDER — LIDOCAINE 2% (20 MG/ML) 5 ML SYRINGE
INTRAMUSCULAR | Status: DC | PRN
  Administered 2023-04-17: 100 mg via INTRAVENOUS

## 2023-04-17 MED ORDER — ONDANSETRON HCL 4 MG/2ML IJ SOLN
INTRAMUSCULAR | Status: DC | PRN
  Administered 2023-04-17: 4 mg via INTRAVENOUS

## 2023-04-17 MED ORDER — PROPOFOL 10 MG/ML IV BOLUS
INTRAVENOUS | Status: DC | PRN
  Administered 2023-04-17: 100 mg via INTRAVENOUS

## 2023-04-17 MED ORDER — FENTANYL CITRATE (PF) 250 MCG/5ML IJ SOLN
INTRAMUSCULAR | Status: DC | PRN
  Administered 2023-04-17: 100 ug via INTRAVENOUS

## 2023-04-17 MED ORDER — CHLORHEXIDINE GLUCONATE 0.12 % MT SOLN: 15.0000 mL | Freq: Once | OROMUCOSAL | Status: DC

## 2023-04-17 MED ORDER — EPHEDRINE SULFATE-NACL 50-0.9 MG/10ML-% IV SOSY
PREFILLED_SYRINGE | INTRAVENOUS | Status: DC | PRN
  Administered 2023-04-17: 10 mg via INTRAVENOUS

## 2023-04-17 NOTE — Interval H&P Note (Signed)
History and Physical Interval Note:  04/17/2023 11:22 AM  Christopher Mosley  has presented today for surgery, with the diagnosis of BILATERAL PULMONARY NODULES, MEDIASTINAL ADENOPATHY.  The various methods of treatment have been discussed with the patient and family. After consideration of risks, benefits and other options for treatment, the patient has consented to  Procedure(s): ROBOTIC ASSISTED NAVIGATIONAL BRONCHOSCOPY (Bilateral) VIDEO BRONCHOSCOPY WITH ENDOBRONCHIAL ULTRASOUND (Bilateral) as a surgical intervention.  The patient's history has been reviewed, patient examined, no change in status, stable for surgery.  I have reviewed the patient's chart and labs.  Questions were answered to the patient's satisfaction.     [name]

## 2023-04-17 NOTE — Transfer of Care (Signed)
Immediate Anesthesia Transfer of Care Note  Patient: Christopher Mosley  Procedure(s) Performed: ROBOTIC ASSISTED NAVIGATIONAL BRONCHOSCOPY (Bilateral) VIDEO BRONCHOSCOPY WITH ENDOBRONCHIAL ULTRASOUND (Bilateral) BRONCHIAL BRUSHINGS BRONCHIAL NEEDLE ASPIRATION BIOPSIES BRONCHIAL BIOPSIES  Patient Location: PACU  Anesthesia Type:General  Level of Consciousness: awake, alert , and oriented  Airway & Oxygen Therapy: Patient Spontanous Breathing and Patient connected to nasal cannula oxygen  Post-op Assessment: Report given to RN and Post -op Vital signs reviewed and stable  Post vital signs: Reviewed and stable  Last Vitals:  Vitals Value Taken Time  BP 153/62 04/17/23 1445  Temp 36.8 C 04/17/23 1445  Pulse 66 04/17/23 1450  Resp 18 04/17/23 1450  SpO2 97 % 04/17/23 1450  Vitals shown include unfiled device data.  Last Pain:  Vitals:   04/17/23 1445  TempSrc:   PainSc: 0-No pain         Complications: No notable events documented.

## 2023-04-17 NOTE — Anesthesia Procedure Notes (Signed)
Procedure Name: Intubation Date/Time: 04/17/2023 2:00 PM  Performed by: Sandie Ano, CRNAPre-anesthesia Checklist: Patient identified, Emergency Drugs available, Suction available and Patient being monitored Patient Re-evaluated:Patient Re-evaluated prior to induction Oxygen Delivery Method: Circle System Utilized Preoxygenation: Pre-oxygenation with 100% oxygen Induction Type: IV induction Ventilation: Mask ventilation without difficulty Laryngoscope Size: Mac and 3 Grade View: Grade I Tube type: Oral Number of attempts: 1 Airway Equipment and Method: Stylet and Oral airway Placement Confirmation: ETT inserted through vocal cords under direct vision, positive ETCO2 and breath sounds checked- equal and bilateral Secured at: 23 cm Tube secured with: Tape Dental Injury: Teeth and Oropharynx as per pre-operative assessment

## 2023-04-17 NOTE — Op Note (Signed)
Video Bronchoscopy with Robotic Assisted Bronchoscopic Navigation and Endobronchial Ultrasound Procedure Note  Date of Operation: 04/17/2023   Pre-op Diagnosis: Left upper lobe nodule, right middle lobe nodule, mediastinal adenopathy.  Post-op Diagnosis: Same  Surgeon: Levy Pupa  Assistants: None  Anesthesia: General endotracheal anesthesia  Operation: Flexible video fiberoptic bronchoscopy with robotic assistance and biopsies.  Estimated Blood Loss: Minimal  Complications: None  Indications and History: Christopher Mosley is a 77 y.o. male with history of renal cell cancer, history of thyroid cancer and also melanoma.  Found to have slowly enlarging pulmonary nodules on surveillance imaging.  Also with an enlarged subcarinal lymph node.  Recommendation made to achieve tissue diagnosis via robotic assisted navigational bronchoscopy and endobronchial ultrasound with biopsies. The risks, benefits, complications, treatment options and expected outcomes were discussed with the patient.  The possibilities of pneumothorax, pneumonia, reaction to medication, pulmonary aspiration, perforation of a viscus, bleeding, failure to diagnose a condition and creating a complication requiring transfusion or operation were discussed with the patient who freely signed the consent.    Description of Procedure: The patient was seen in the Preoperative Area, was examined and was deemed appropriate to proceed.  The patient was taken to [facility] endoscopy room 3, identified as Kristijan Cayea and the procedure verified as Flexible Video Fiberoptic Bronchoscopy.  A Time Out was held and the above information confirmed.   Prior to the date of the procedure a high-resolution CT scan of the chest was performed. Utilizing ION software program a virtual tracheobronchial tree was generated to allow the creation of distinct navigation pathways to the patient's parenchymal abnormalities. After being taken to the  operating room general anesthesia was initiated and the patient  was orally intubated. The video fiberoptic bronchoscope was introduced via the endotracheal tube and a general inspection was performed which showed normal right and left lung anatomy. Aspiration of the bilateral mainstems was completed to remove any remaining secretions. Robotic catheter inserted into patient's endotracheal tube.   Target #1 left upper lobe nodule: The distinct navigation pathways prepared prior to this procedure were then utilized to navigate to patient's lesion identified on CT scan. The robotic catheter was secured into place and the vision probe was withdrawn.  Lesion location was approximated using fluoroscopy.  Local registration and targeting was performed using Cios three-dimensional imaging. Under fluoroscopic guidance transbronchial needle brushings, transbronchial needle biopsies, and transbronchial forceps biopsies were performed to be sent for cytology and pathology.   Target #2 right middle lobe nodule: The distinct navigation pathways prepared prior to this procedure were then utilized to navigate to patient's lesion identified on CT scan. The robotic catheter was secured into place and the vision probe was withdrawn.  Lesion location was approximated using fluoroscopy.  Local registration and targeting was performed using Cios three-dimensional imaging. Under fluoroscopic guidance transbronchial needle brushings, transbronchial needle biopsies, and transbronchial forceps biopsies were performed to be sent for cytology and pathology.  Needle in lesion was established using Cios-spin.   The robotic scope was then withdrawn and the endobronchial ultrasound was used to identify and characterize the peritracheal, hilar and bronchial lymph nodes. Inspection showed enlargement at station 7. Using real-time ultrasound guidance Wang needle biopsies were take from Station 7 nodes and were sent for cytology.   At the  end of the procedure a general airway inspection was performed and there was no evidence of active bleeding. The bronchoscope was removed.  The patient tolerated the procedure well. There was no significant blood  loss and there were no obvious complications. A post-procedural chest x-ray is pending.  Samples Target #1: 1. Transbronchial needle brushings from left upper lobe nodule 2. Transbronchial Wang needle biopsies from left upper lobe nodule 3. Transbronchial forceps biopsies from left upper lobe nodule  Samples Target #2: 1. Transbronchial needle brushings from right middle lobe nodule 2. Transbronchial Wang needle biopsies from right middle lobe nodule 3. Transbronchial forceps biopsies from right middle lobe nodule  EBUS Samples: 1. Wang needle biopsies from 7 node  Plans:  The patient will be discharged from the PACU to home when recovered from anesthesia and after chest x-ray is reviewed. We will review the cytology, pathology and microbiology results with the patient when they become available. Outpatient followup will be with Dr. Delton Coombes and Dr. Arbutus Ped.   Levy Pupa, MD, PhD 04/17/2023, 2:41 PM [facility] [phone] or if no answer before 7:00PM call [phone] For any issues after 7:00PM please call eLink [phone]

## 2023-04-17 NOTE — Discharge Instructions (Addendum)
 Flexible Bronchoscopy, Care After This sheet gives you information about how to care for yourself after your test. Your doctor may also give you more specific instructions. If you have problems or questions, contact your doctor. Follow these instructions at home: Eating and drinking When your numbness is gone and your cough and gag reflexes have come back, you may: Eat only soft foods. Slowly drink liquids. When you get home after the test, go back to your normal diet. Driving Do not drive for 24 hours if you were given a medicine to help you relax (sedative). Do not drive or use heavy machinery while taking prescription pain medicine. General instructions  Take over-the-counter and prescription medicines only as told by your doctor. Return to your normal activities as told. Ask what activities are safe for you. Do not use any products that have nicotine or tobacco in them. This includes cigarettes and e-cigarettes. If you need help quitting, ask your doctor. Keep all follow-up visits as told by your doctor. This is important. It is very important if you had a tissue sample (biopsy) taken. Get help right away if: You have shortness of breath that gets worse. You get light-headed. You feel like you are going to pass out (faint). You have chest pain. You cough up: More than a little blood. More blood than before. Summary Do not eat or drink anything (not even water) for 2 hours after your test, or until your numbing medicine wears off. Do not use cigarettes. Do not use e-cigarettes. Get help right away if you have chest pain.  Please call our office for any questions or concerns.  [phone].  This information is not intended to replace advice given to you by your health care provider. Make sure you discuss any questions you have with your health care provider. Document Released: 02/20/2009 Document Revised: 04/07/2017 Document Reviewed: 05/13/2016 Elsevier Patient Education  2020  [employer].

## 2023-04-17 NOTE — Anesthesia Postprocedure Evaluation (Signed)
Anesthesia Post Note  Patient: Deonta Stigen Werts  Procedure(s) Performed: ROBOTIC ASSISTED NAVIGATIONAL BRONCHOSCOPY (Bilateral) VIDEO BRONCHOSCOPY WITH ENDOBRONCHIAL ULTRASOUND (Bilateral) BRONCHIAL BRUSHINGS BRONCHIAL NEEDLE ASPIRATION BIOPSIES BRONCHIAL BIOPSIES     Patient location during evaluation: PACU Anesthesia Type: General Level of consciousness: awake and alert Pain management: pain level controlled Vital Signs Assessment: post-procedure vital signs reviewed and stable Respiratory status: spontaneous breathing, nonlabored ventilation, respiratory function stable and patient connected to nasal cannula oxygen Cardiovascular status: blood pressure returned to baseline and stable Postop Assessment: no apparent nausea or vomiting Anesthetic complications: no   No notable events documented.  Last Vitals:  Vitals:   04/17/23 1615 04/17/23 1630  BP: (!) 164/72 (!) 157/70  Pulse: 69 67  Resp: 18 19  Temp:  36.9 C  SpO2: 100% 94%    Last Pain:  Vitals:   04/17/23 1445  TempSrc:   PainSc: 0-No pain                 Trevor Iha

## 2023-04-17 NOTE — Interval H&P Note (Signed)
History and Physical Interval Note:  04/17/2023 11:09 AM  Christopher Mosley  has presented today for surgery, with the diagnosis of BILATERAL PULMONARY NODULES, MEDIASTINAL ADENOPATHY.  The various methods of treatment have been discussed with the patient and family. After consideration of risks, benefits and other options for treatment, the patient has consented to  Procedure(s): ROBOTIC ASSISTED NAVIGATIONAL BRONCHOSCOPY (Bilateral) VIDEO BRONCHOSCOPY WITH ENDOBRONCHIAL ULTRASOUND (Bilateral) as a surgical intervention.  The patient's history has been reviewed, patient examined, no change in status, stable for surgery.  I have reviewed the patient's chart and labs.  Questions were answered to the patient's satisfaction.     [name]

## 2023-04-18 ENCOUNTER — Encounter (HOSPITAL_COMMUNITY): Payer: Self-pay | Admitting: Emergency Medicine

## 2023-04-21 ENCOUNTER — Telehealth: Payer: Self-pay | Admitting: Emergency Medicine

## 2023-04-21 LAB — CYTOLOGY - NON PAP

## 2023-04-21 NOTE — Telephone Encounter (Signed)
Reviewed bronchoscopy results with the patient by phone today.  Both of his nodular biopsies showed carcinoid tumor (not renal cell mets).  Unclear whether this represents typical versus atypical carcinoid but based on the multiple nodules suspected is the latter.  He has follow-up with Dr. Arbutus Ped soon to talk about treatment versus observation.  FYI to Dr. Arbutus Ped

## 2023-04-25 ENCOUNTER — Ambulatory Visit: Payer: Medicare Other | Admitting: Acute Care

## 2023-04-25 ENCOUNTER — Encounter: Payer: Self-pay | Admitting: Acute Care

## 2023-04-25 VITALS — BP 162/70 | HR 58 | Ht 67.0 in | Wt 164.6 lb

## 2023-04-25 DIAGNOSIS — R918 Other nonspecific abnormal finding of lung field: Secondary | ICD-10-CM

## 2023-04-25 DIAGNOSIS — J069 Acute upper respiratory infection, unspecified: Secondary | ICD-10-CM

## 2023-04-25 DIAGNOSIS — Z85528 Personal history of other malignant neoplasm of kidney: Secondary | ICD-10-CM | POA: Diagnosis not present

## 2023-04-25 DIAGNOSIS — Z9889 Other specified postprocedural states: Secondary | ICD-10-CM

## 2023-04-25 MED ORDER — AZITHROMYCIN 250 MG PO TABS: ORAL_TABLET | ORAL | 0 refills | Status: DC

## 2023-04-25 NOTE — Progress Notes (Signed)
History of Present Illness Christopher Mosley is a 77 y.o. male never smoker with referred to Dr. Delton Coombes for bronchoscopy with biopsies for lymphadenopathy.   Synopsis 77 year old individual with a complex medical history, including hypertension, hyperlipidemia, hepatitis B, and multiple malignancies. The malignancies include stage one clear cell renal cell carcinoma diagnosed in 2022, papillary thyroid carcinoma post-thyroidectomy, and melanoma. The patient has been under the care of an oncologist, Dr. Jerolyn Center, who has been conducting surveillance imaging. Recent imaging has identified enlarging pulmonary nodular disease on a CT scan of the chest.   The most recent CT scan of the chest, abdomen, and pelvis, conducted on 03/30/23, showed bilateral pleural thickening with some right-sided pleural calcification. There is a speculated 9mm right middle lobe nodule that has slightly increased in size compared to a previous scan from 09/22/18. Additional findings include a 4mm right lower lobe nodule, a slightly enlarged 10mm left upper lobe pulmonary nodule, and a 5mm left lower lobe nodule. There is also some borderline mediastinal adenopathy with a subcarotid node at 1.3cm.   The patient has been following these pulmonary nodules with Dr. Arbutus Ped. The initial 90-day scan after kidney surgery showed three spots on the lungs and one on the liver. At that time, the radiologist suggested stage four renal cell cancer, but this was questioned by the patient's doctor. Since then, the patient has been undergoing regular scans. There has been no multiplication of the spots, and they have remained the same size. However, the most recent scan showed slight growth in two of the nodules, raising suspicion.   In addition to the pulmonary nodules, the patient has a history of tetralogy of flow, polio, and has undergone multiple surgeries, including pulmonary valve replacement in 2018. The patient also had two  melanomas and thyroid cancer, all of which were surgically removed. Recently, the patient has been experiencing breathing difficulties, described as air going over a baffle, which has been affecting his sleep. The patient denies any coughing or nasal congestion and drainage.      04/25/2023 Pt. Presents for follow up after bronch with biopsies . The patient underwent a procedure on December 9th and subsequently experienced post-procedural complications. Upon returning home, they developed vomiting and diarrhea that persisted for three days. By the fourth day, they noticed a sensation of drainage but were unable to expectorate. Eventually, they were able to expel sputum, which was initially blood-tinged but later became clear.  The patient also reported feeling congested and experienced discomfort when lying down. They noted difficulty breathing, particularly in cold outdoor conditions, which resulted in gasping to return to normal breathing. They denied any history of COPD, cough, fever, allergies, or wheezing.  The patient has a history of multiple cancers, including two melanomas, thyroid cancer, renal cell carcinoma, and a recent diagnosis of a carcinoid tumor. 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  The patient has been proactive in managing their health and has committed to contacting their healthcare provider if their condition worsens. Physical Exam  Test Results: Cytology 04/17/2023 A. LUNG, LUL, BRUSHING:  - Atypical cells present   B. LUNG, LUL, FINE NEEDLE ASPIRATION:  - Carcinoid tumor, not  otherwise specified  - See comment    C. LUNG, RML, BRUSHINF:       - No malignant cells identified   D. LUNG, RML, FINE NEEDLE ASPIRATION:  - Carcinoid tumor, not otherwise specified.  - See comment.    E. LYMPH NODE, 7, FINE NEEDLE ASPIRATION:  - No malignant cells identified  - Lymphoid tissue present   Imaging CT chest, abdomen, and pelvis: Bilateral pleural thickening with right side pleural calcification. Speculated 9 mm right middle lobe nodule, 4 mm right lower lobe nodule, 10 mm left upper lobe pulmonary nodule, 5 mm left lower lobe nodule. Borderline mediastinal adenopathy with 1.3 cm subcarotid node. (03/30/2023)     Latest Ref Rng & Units 03/30/2023    9:39 AM 09/22/2022    8:06 AM 03/17/2022    9:58 AM  CBC  WBC 4.0 - 10.5 K/uL 7.9  8.5  8.5   Hemoglobin 13.0 - 17.0 g/dL 02.7  25.3  66.4   Hematocrit 39.0 - 52.0 % 42.0  46.6  46.6   Platelets 150 - 400 K/uL 267  251  236        Latest Ref Rng & Units 03/30/2023    9:39 AM 09/22/2022    8:06 AM 03/17/2022    9:58 AM  BMP  Glucose 70 - 99 mg/dL 403  474  97   BUN 8 - 23 mg/dL 33  35  31   Creatinine 0.61 - 1.24 mg/dL 2.59  5.63  8.75   Sodium 135 - 145 mmol/L 140  139  141   Potassium 3.5 - 5.1 mmol/L 4.4  4.3  4.6   Chloride 98 - 111 mmol/L 101  102  104   CO2 22 - 32 mmol/L 30  28  31    Calcium 8.9 - 10.3 mg/dL 9.8  9.3  8.8     BNP No results found for: "BNP"  ProBNP No results found for: "PROBNP"  PFT No results found for: "FEV1PRE", "FEV1POST", "FVCPRE", "FVCPOST", "TLC", "DLCOUNC", "PREFEV1FVCRT", "PSTFEV1FVCRT"  DG Chest Port 1 View Result Date: 04/17/2023 CLINICAL DATA:  Status post bronchoscopy, history of renal cell carcinoma with pulmonary metastases EXAM: PORTABLE CHEST 1 VIEW COMPARISON:  03/30/2023 FINDINGS: Single frontal view of the chest demonstrates stable enlargement of the cardiac silhouette, with chronic pulmonary vascular congestion. The scattered pulmonary nodule seen on prior chest CT are more difficult to appreciate  by x-ray. No new consolidation, effusion, or pneumothorax. No acute fracture. IMPRESSION: 1. No evidence of complication after bronchoscopy. 2. Stable enlarged cardiac silhouette and pulmonary vascular congestion. 3. Numerous pulmonary nodules seen on prior chest CT are more difficult to visualized by radiograph. Electronically Signed   By: Sharlet Salina M.D.   On: 04/17/2023 16:35   DG C-ARM BRONCHOSCOPY Result Date: 04/17/2023 C-ARM BRONCHOSCOPY: Fluoroscopy was utilized by the requesting physician.  No radiographic interpretation.   CT CHEST ABDOMEN PELVIS WO CONTRAST Result Date: 04/03/2023 CLINICAL DATA:  Staging of renal cell carcinoma, right nephrectomy 2 years ago. Asymptomatic. * Tracking Code: BO * EXAM: CT CHEST, ABDOMEN AND PELVIS WITHOUT CONTRAST TECHNIQUE: Multidetector CT imaging of the chest, abdomen and pelvis was performed following the standard protocol without IV contrast. RADIATION DOSE REDUCTION: This exam was performed according to the departmental dose-optimization program which includes automated exposure control, adjustment of the mA and/or kV according to patient size and/or use of iterative reconstruction technique. COMPARISON:  09/22/2022 FINDINGS: CT CHEST FINDINGS Cardiovascular: Aortic atherosclerosis. Ascending aortic dilatation at 4.4 cm  on 28/2 is similar. Tortuous descending thoracic aorta. Moderate cardiomegaly. Aortic valve calcification. Multivessel coronary artery atherosclerosis. Pulmonary valve replacement with pulmonary artery enlargement, relatively similar. Mediastinum/Nodes: Thyroidectomy. No supraclavicular adenopathy. Upper normal subcarinal node at 1.3 cm is unchanged. Hilar regions poorly evaluated without intravenous contrast. Tiny hiatal hernia. Lungs/Pleura: Bilateral pleural thickening with right-sided pleural calcification. The right pleural thickening is similar and the left pleural thickening is new. Bilateral pulmonary nodules. Index posterior right  middle lobe spiculated nodule measures 9 x 8 mm on 73/4 versus 7 x 8 mm on the prior. Index subpleural right lower lobe nodule measures 4 mm on 115/4, similar. Index left upper lobe pulmonary nodule measures 10 x 10 mm on 36/4 versus 9 x 9 mm when remeasured in a similar fashion on the prior. Index anterior left lower lobe nodule measures 5 mm on 94/4 and is similar. More posterior and lateral subpleural 7 mm nodule is unchanged on 113/4. Musculoskeletal: Included within the abdomen pelvic section. CT ABDOMEN PELVIS FINDINGS Hepatobiliary: Normal noncontrast appearance of the liver. Dependent gallstones without acute cholecystitis or biliary duct dilatation. Pancreas: Normal, without mass or ductal dilatation. Spleen: Normal in size, without focal abnormality. Adrenals/Urinary Tract: Normal adrenal glands. Right nephrectomy, without local recurrence. Punctate lower pole left renal collecting system calculus. Fluid density left renal lesions of maximally 3.5 cm are incompletely characterized but likely cysts. Parenchymal calcification in the upper pole left kidney of 3 mm may be dystrophic or related to a tiny complex cyst. No hydronephrosis. Normal urinary bladder. Stomach/Bowel: Normal remainder of the stomach. Extensive colonic diverticulosis. Normal terminal ileum and appendix. Normal small bowel. Vascular/Lymphatic: Advanced aortic and branch vessel atherosclerosis. No abdominopelvic adenopathy. Reproductive: Normal prostate. Other: New small volume pelvic fluid. Small fat containing left inguinal hernia. Musculoskeletal: Prior median sternotomy. IMPRESSION: CT CHEST IMPRESSION 1. Pulmonary metastasis are primarily similar. 2 of the larger nodules have minimally enlarged, suggesting mild progression. 2. Similar borderline mediastinal adenopathy, most likely reactive. 3. Ascending aortic aneurysm, similar at 4.4 cm. Recommend annual imaging followup by CTA or MRA. This recommendation follows 2010  ACCF/AHA/AATS/ACR/ASA/SCA/SCAI/SIR/STS/SVM Guidelines for the Diagnosis and Management of Patients with Thoracic Aortic Disease. Circulation. 2010; 121: Z610-R604. Aortic aneurysm NOS (ICD10-I71.9) 4. Pulmonary artery enlargement suggests pulmonary arterial hypertension. 5. Coronary artery atherosclerosis. Aortic Atherosclerosis (ICD10-I70.0). CT ABDOMEN AND PELVIS IMPRESSION 1. Right nephrectomy, without recurrent or metastatic disease. 2. Left nephrolithiasis 3. Cholelithiasis 4. New small volume pelvic fluid, nonspecific. Electronically Signed   By: Jeronimo Greaves M.D.   On: 04/03/2023 11:38     Past medical hx Past Medical History:  Diagnosis Date   Arthritis    Colon polyps 09/08/2017   q 5 years; need records   Complication of anesthesia    Heart murmur    History of hepatitis B    Hyperlipemia    Hypertension    Hypothyroidism    Lesion of vocal cord 02/06/2017   Right granuloma; resolved on f/u   Melanoma (HCC)    Papillary thyroid carcinoma (HCC)    s/p thyroidectomy,kidney,melanomas(2)   PONV (postoperative nausea and vomiting)    Urine incontinence      Social History   Tobacco Use   Smoking status: Never   Smokeless tobacco: Never  Vaping Use   Vaping status: Never Used  Substance Use Topics   Alcohol use: Yes    Comment: occas.   Drug use: Never    Christopher Mosley reports that he has never smoked. He has never used smokeless tobacco. He  reports current alcohol use. He reports that he does not use drugs.  Tobacco Cessation: Counseling given: Not Answered   Past surgical hx, Family hx, Social hx all reviewed.  Current Outpatient Medications on File Prior to Visit  Medication Sig   amLODipine (NORVASC) 5 MG tablet Take 5 mg by mouth daily.   aspirin (ASPIRIN 81) 81 MG chewable tablet    hydrALAZINE (APRESOLINE) 50 MG tablet Take 50 mg by mouth 2 (two) times daily.   levothyroxine (SYNTHROID) 75 MCG tablet TAKE 1 TABLET BY MOUTH ONCE DAILY BEFORE BREAKFAST  (Patient taking differently: Take 75 mcg by mouth daily before breakfast.)   rosuvastatin (CRESTOR) 20 MG tablet Take 20 mg by mouth at bedtime.   torsemide (DEMADEX) 5 MG tablet Take 5 mg by mouth daily.   HYDROcodone-acetaminophen (NORCO/VICODIN) 5-325 MG tablet  (Patient not taking: Reported on 04/25/2023)   No current facility-administered medications on file prior to visit.     Allergies  Allergen Reactions   Codeine Other (See Comments)    Syncope    Sulfa Antibiotics Diarrhea and Rash    Review Of Systems:  Constitutional:   No  weight loss, night sweats,  Fevers, chills, fatigue, or  lassitude.  HEENT:   No headaches,  Difficulty swallowing,  Tooth/dental problems, or  Sore throat,                No sneezing, itching, ear ache, nasal congestion, post nasal drip,   CV:  No chest pain,  Orthopnea, PND, swelling in lower extremities, anasarca, dizziness, palpitations, syncope.   GI  No heartburn, indigestion, abdominal pain, nausea, vomiting, diarrhea, change in bowel habits, loss of appetite, bloody stools.   Resp: No shortness of breath with exertion or at rest.  No excess mucus, no productive cough,  No non-productive cough,  No coughing up of blood.  No change in color of mucus.  No wheezing.  No chest wall deformity  Skin: no rash or lesions.  GU: no dysuria, change in color of urine, no urgency or frequency.  No flank pain, no hematuria   MS:  No joint pain or swelling.  No decreased range of motion.  No back pain.  Psych:  No change in mood or affect. No depression or anxiety.  No memory loss.   Vital Signs BP (!) 162/70   Pulse (!) 58   Ht 5\' 7"  (1.702 m)   Wt 164 lb 9.6 oz (74.7 kg)   SpO2 97%   BMI 25.78 kg/m    Physical Exam:  General- No distress,  A&Ox3 ENT: No sinus tenderness, TM clear, pale nasal mucosa, no oral exudate,no post nasal drip, no LAN Cardiac: S1, S2, regular rate and rhythm, no murmur Chest: No wheeze/ rales/ dullness; no accessory  muscle use, no nasal flaring, no sternal retractions Abd.: Soft Non-tender Ext: No clubbing cyanosis, edema Neuro:  normal strength Skin: No rashes, warm and dry Psych: normal mood and behavior     - Carcinoid tumor>> 04/2023 - Atypical cells>> 04/2023 - Tetralogy of Fallot (corrected) - Pulmonary valve replacement - 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 Currently, symptoms have improved with clear sputum and no fever. No signs of infection or bronchospasm. -Prescribe Z-Pak (Azithromycin) as a precautionary measure due to recent bronchoscopy. - Take 2 tablets today, and one daily for the following 4  days. -If symptoms do not improve, patient to contact office for further evaluation and possible chest x-ray.  Carcinoid Tumor Recent bronchoscopy revealed atypical cells and carcinoid tumor. Patient has a history of multiple cancers including renal cell carcinoma, melanomas, and thyroid cancer. -Continue follow-up with Dr. Constance Haw on 05/04/2023 to discuss treatment options, including possible surgical resection.  Tetralogy of Fallot Patient has a history of multiple heart surgeries for correction of Tetralogy of Fallot, including pulmonary valve replacement. No current cardiac symptoms reported. -Continue current cardiac management and follow up.  I spent 25 minutes dedicated to the care of this patient on the date of this encounter to include pre-visit review of records, face-to-face time with the patient discussing conditions above, post visit ordering of testing, clinical documentation with the electronic health record, making appropriate referrals as documented, and communicating necessary information to the patient's healthcare team.     Bevelyn Ngo, NP 04/25/2023  9:20  AM

## 2023-04-25 NOTE — Patient Instructions (Addendum)
It is good to see you today. I am so sorry you have had a hard time since the procedure.  I have sent in a z pack to ensure you do not have an infection brewing.  Take 2 tablets today, and one daily for the following 4 days. Please call to be seen if you do not feel better , so we can do a CXR.  Follow up with Dr. Arbutus Ped to discuss treatment options on 05/04/2023. Good luck with your treatment. Please call if you do not improve on antibiotics. Please contact office for sooner follow up if symptoms do not improve or worsen or seek emergency care

## 2023-04-27 ENCOUNTER — Other Ambulatory Visit: Payer: Self-pay

## 2023-04-27 ENCOUNTER — Other Ambulatory Visit: Payer: Medicare Other | Attending: Internal Medicine

## 2023-04-27 ENCOUNTER — Inpatient Hospital Stay: Payer: Medicare Other | Admitting: Internal Medicine

## 2023-04-27 VITALS — BP 147/85 | HR 65 | Temp 97.9°F | Resp 20 | Wt 161.6 lb

## 2023-04-27 DIAGNOSIS — K449 Diaphragmatic hernia without obstruction or gangrene: Secondary | ICD-10-CM | POA: Diagnosis not present

## 2023-04-27 DIAGNOSIS — Z905 Acquired absence of kidney: Secondary | ICD-10-CM | POA: Diagnosis not present

## 2023-04-27 DIAGNOSIS — I272 Pulmonary hypertension, unspecified: Secondary | ICD-10-CM | POA: Diagnosis not present

## 2023-04-27 DIAGNOSIS — Z860101 Personal history of adenomatous and serrated colon polyps: Secondary | ICD-10-CM | POA: Diagnosis not present

## 2023-04-27 DIAGNOSIS — Z79899 Other long term (current) drug therapy: Secondary | ICD-10-CM | POA: Diagnosis not present

## 2023-04-27 DIAGNOSIS — I2510 Atherosclerotic heart disease of native coronary artery without angina pectoris: Secondary | ICD-10-CM | POA: Insufficient documentation

## 2023-04-27 DIAGNOSIS — K4090 Unilateral inguinal hernia, without obstruction or gangrene, not specified as recurrent: Secondary | ICD-10-CM | POA: Insufficient documentation

## 2023-04-27 DIAGNOSIS — J441 Chronic obstructive pulmonary disease with (acute) exacerbation: Secondary | ICD-10-CM | POA: Diagnosis not present

## 2023-04-27 DIAGNOSIS — Z885 Allergy status to narcotic agent status: Secondary | ICD-10-CM | POA: Insufficient documentation

## 2023-04-27 DIAGNOSIS — Z792 Long term (current) use of antibiotics: Secondary | ICD-10-CM | POA: Diagnosis not present

## 2023-04-27 DIAGNOSIS — K5730 Diverticulosis of large intestine without perforation or abscess without bleeding: Secondary | ICD-10-CM | POA: Insufficient documentation

## 2023-04-27 DIAGNOSIS — Z85850 Personal history of malignant neoplasm of thyroid: Secondary | ICD-10-CM | POA: Insufficient documentation

## 2023-04-27 DIAGNOSIS — Z85820 Personal history of malignant melanoma of skin: Secondary | ICD-10-CM | POA: Insufficient documentation

## 2023-04-27 DIAGNOSIS — I700 Atherosclerosis of aorta: Secondary | ICD-10-CM | POA: Diagnosis not present

## 2023-04-27 DIAGNOSIS — K8020 Calculus of gallbladder without cholecystitis without obstruction: Secondary | ICD-10-CM | POA: Diagnosis not present

## 2023-04-27 DIAGNOSIS — C7800 Secondary malignant neoplasm of unspecified lung: Secondary | ICD-10-CM | POA: Diagnosis not present

## 2023-04-27 DIAGNOSIS — Z882 Allergy status to sulfonamides status: Secondary | ICD-10-CM | POA: Insufficient documentation

## 2023-04-27 DIAGNOSIS — C7A1 Malignant poorly differentiated neuroendocrine tumors: Secondary | ICD-10-CM | POA: Diagnosis not present

## 2023-04-27 DIAGNOSIS — C641 Malignant neoplasm of right kidney, except renal pelvis: Secondary | ICD-10-CM | POA: Insufficient documentation

## 2023-04-27 DIAGNOSIS — I7121 Aneurysm of the ascending aorta, without rupture: Secondary | ICD-10-CM | POA: Diagnosis not present

## 2023-04-27 DIAGNOSIS — N200 Calculus of kidney: Secondary | ICD-10-CM | POA: Insufficient documentation

## 2023-04-27 LAB — CMP (CANCER CENTER ONLY)
ALT: 28 U/L (ref 0–44)
AST: 34 U/L (ref 15–41)
Albumin: 4.5 g/dL (ref 3.5–5.0)
Alkaline Phosphatase: 71 U/L (ref 38–126)
Anion gap: 7 (ref 5–15)
BUN: 34 mg/dL — ABNORMAL HIGH (ref 8–23)
CO2: 30 mmol/L (ref 22–32)
Calcium: 9.4 mg/dL (ref 8.9–10.3)
Chloride: 104 mmol/L (ref 98–111)
Creatinine: 1.75 mg/dL — ABNORMAL HIGH (ref 0.61–1.24)
GFR, Estimated: 40 mL/min — ABNORMAL LOW (ref >60)
Glucose, Bld: 119 mg/dL — ABNORMAL HIGH (ref 70–99)
Potassium: 4.5 mmol/L (ref 3.5–5.1)
Sodium: 141 mmol/L (ref 135–145)
Total Bilirubin: 0.6 mg/dL (ref <1.2)
Total Protein: 6.4 g/dL — ABNORMAL LOW (ref 6.5–8.1)

## 2023-04-27 LAB — CBC WITH DIFFERENTIAL (CANCER CENTER ONLY)
Abs Immature Granulocytes: 0.03 10*3/uL (ref 0.00–0.07)
Basophils Absolute: 0 10*3/uL (ref 0.0–0.1)
Basophils Relative: 1 %
Eosinophils Absolute: 0.5 10*3/uL (ref 0.0–0.5)
Eosinophils Relative: 6 %
HCT: 37.4 % — ABNORMAL LOW (ref 39.0–52.0)
Hemoglobin: 11.8 g/dL — ABNORMAL LOW (ref 13.0–17.0)
Immature Granulocytes: 0 %
Lymphocytes Relative: 27 %
Lymphs Abs: 2.1 10*3/uL (ref 0.7–4.0)
MCH: 29.1 pg (ref 26.0–34.0)
MCHC: 31.6 g/dL (ref 30.0–36.0)
MCV: 92.3 fL (ref 80.0–100.0)
Monocytes Absolute: 1 10*3/uL (ref 0.1–1.0)
Monocytes Relative: 13 %
Neutro Abs: 4.2 10*3/uL (ref 1.7–7.7)
Neutrophils Relative %: 53 %
Platelet Count: 243 10*3/uL (ref 150–400)
RBC: 4.05 MIL/uL — ABNORMAL LOW (ref 4.22–5.81)
RDW: 13.6 % (ref 11.5–15.5)
WBC Count: 7.8 10*3/uL (ref 4.0–10.5)
nRBC: 0 % (ref 0.0–0.2)

## 2023-04-27 LAB — LACTATE DEHYDROGENASE: LDH: 176 U/L (ref 98–192)

## 2023-04-27 NOTE — Progress Notes (Signed)
[facility] [phone]   [phone]  OFFICE PROGRESS NOTE  Melida Quitter, MD [address] [place] [address]  DIAGNOSIS:  1) Stage I (T1a, N0, M0) clear-cell renal cell carcinoma diagnosed in 2022.   2) bilateral pulmonary nodule consistent with carcinoid tumor diagnosed in December 2024.  PRIOR THERAPY: Status post robotic assisted laparoscopic right radical nephrectomy on June 26, 2020 and the final pathology showed clear-cell renal cell carcinoma nuclear grade 2 measuring 4.0 cm.  CURRENT THERAPY: Observation  INTERVAL HISTORY: Christopher Mosley 77 y.o. male returns to the clinic today for follow-up visit. Discussed the use of AI scribe software for clinical note transcription with the patient, who gave verbal consent to proceed.  History of Present Illness   The patient, a 77 year old with a history of stage 1 clear cell renal cell carcinoma, underwent a right nephrectomy in February 2022. Recently, bilateral pulmonary nodules were discovered, raising concerns about metastasis or a new malignancy. A bronchoscopy was performed last week, and the biopsy revealed a carcinoid tumor, a slow-growing malignancy distinct from the renal cell carcinoma.  The patient reported post-bronchoscopy complications, including vomiting and diarrhea for three days. He also described a sensation of 'wind coming through a baffle,' particularly when lying down, which has been disrupting his sleep. He is currently on a Z-Pak to manage these symptoms.  The patient denied any current nausea, vomiting, diarrhea, constipation, recent weight loss, or night sweats. He has a history of COPD, pulmonary hypertension, and other lung issues, but these are not related to the carcinoid tumor.  The patient has previously undergone nuclear scans for heart conditions and expressed concern about the upcoming PET scan's potential impact on his kidney.       MEDICAL  HISTORY: Past Medical History:  Diagnosis Date   Arthritis    Colon polyps 09/08/2017   q 5 years; need records   Complication of anesthesia    Heart murmur    History of hepatitis B    Hyperlipemia    Hypertension    Hypothyroidism    Lesion of vocal cord 02/06/2017   Right granuloma; resolved on f/u   Melanoma (HCC)    Papillary thyroid carcinoma (HCC)    s/p thyroidectomy,kidney,melanomas(2)   PONV (postoperative nausea and vomiting)    Urine incontinence     ALLERGIES:  is allergic to codeine and sulfa antibiotics.  MEDICATIONS:  Current Outpatient Medications  Medication Sig Dispense Refill   amLODipine (NORVASC) 5 MG tablet Take 5 mg by mouth daily.     aspirin (ASPIRIN 81) 81 MG chewable tablet      azithromycin (ZITHROMAX) 250 MG tablet Take 2 tablets today and one daily for the next 4 days. 6 tablet 0   hydrALAZINE (APRESOLINE) 50 MG tablet Take 50 mg by mouth 2 (two) times daily.     HYDROcodone-acetaminophen (NORCO/VICODIN) 5-325 MG tablet  (Patient not taking: Reported on 04/25/2023)     levothyroxine (SYNTHROID) 75 MCG tablet TAKE 1 TABLET BY MOUTH ONCE DAILY BEFORE BREAKFAST (Patient taking differently: Take 75 mcg by mouth daily before breakfast.) 90 tablet 3   rosuvastatin (CRESTOR) 20 MG tablet Take 20 mg by mouth at bedtime.     torsemide (DEMADEX) 5 MG tablet Take 5 mg by mouth daily.     No current facility-administered medications for this visit.    SURGICAL HISTORY:  Past Surgical History:  Procedure Laterality Date   BLADDER SURGERY  2016  BLALOCK PROCEDURE  1954   BRONCHIAL BIOPSY  04/17/2023   Procedure: BRONCHIAL BIOPSIES;  Surgeon: [name], MD;  Location: [facility] ENDOSCOPY;  Service: Pulmonary;;   BRONCHIAL BRUSHINGS  04/17/2023   Procedure: BRONCHIAL BRUSHINGS;  Surgeon: [name], MD;  Location: [facility] ENDOSCOPY;  Service: Pulmonary;;   BRONCHIAL NEEDLE ASPIRATION BIOPSY  04/17/2023   Procedure: BRONCHIAL NEEDLE ASPIRATION BIOPSIES;   Surgeon: [name], MD;  Location: [facility] ENDOSCOPY;  Service: Pulmonary;;   COLONOSCOPY     multiple - hx adenomas 2010 and 2015   CYSTOSCOPY N/A 06/26/2020   Procedure: FLEXIBLE CYSTOSCOPY FOR BLADDER STONE AND PLACEMENT OF FOLEY;  Surgeon: Jannifer Hick, MD;  Location: [facility] ORS;  Service: Urology;  Laterality: N/A;   MELANOMA EXCISION     [place] shunt  1957   PULMONARY VALVE REPLACEMENT  10/21/2016   pig valve   ROBOTIC ASSITED PARTIAL NEPHRECTOMY Right 06/26/2020   Procedure: XI ROBOTIC ASSITED LAPAROSCOPIC RADICAL NEPHRECTOMY;  Surgeon: Jannifer Hick, MD;  Location: [facility] ORS;  Service: Urology;  Laterality: Right;   TETRALOGY OF FALLOT REPAIR  1961   TOTAL KNEE ARTHROPLASTY Left 2016   TOTAL THYROIDECTOMY  2002   VIDEO BRONCHOSCOPY WITH ENDOBRONCHIAL ULTRASOUND Bilateral 04/17/2023   Procedure: VIDEO BRONCHOSCOPY WITH ENDOBRONCHIAL ULTRASOUND;  Surgeon: [name], MD;  Location: [facility] ENDOSCOPY;  Service: Pulmonary;  Laterality: Bilateral;    REVIEW OF SYSTEMS:  Constitutional: positive for fatigue Eyes: negative Ears, nose, mouth, throat, and face: negative Respiratory: negative Cardiovascular: negative Gastrointestinal: negative Genitourinary:negative Integument/breast: negative Hematologic/lymphatic: negative Musculoskeletal:negative Neurological: negative Behavioral/Psych: negative Endocrine: negative Allergic/Immunologic: negative   PHYSICAL EXAMINATION: General appearance: alert, cooperative, fatigued, and no distress Head: Normocephalic, without obvious abnormality, atraumatic Neck: no adenopathy, no JVD, supple, symmetrical, trachea midline, and thyroid not enlarged, symmetric, no tenderness/mass/nodules Lymph nodes: Cervical, supraclavicular, and axillary nodes normal. Resp: clear to auscultation bilaterally Back: symmetric, no curvature. ROM normal. No CVA tenderness. Cardio: regular rate and rhythm, S1, S2 normal, no murmur, click, rub or gallop GI: soft,  non-tender; bowel sounds normal; no masses,  no organomegaly Extremities: extremities normal, atraumatic, no cyanosis or edema Neurologic: Alert and oriented X 3, normal strength and tone. Normal symmetric reflexes. Normal coordination and gait  ECOG PERFORMANCE STATUS: 1 - Symptomatic but completely ambulatory  Blood pressure (!) 163/85, pulse 65, temperature 97.9 F (36.6 C), resp. rate 20, weight 161 lb 9.6 oz (73.3 kg), SpO2 97%.  LABORATORY DATA: Lab Results  Component Value Date   WBC 7.8 04/27/2023   HGB 11.8 (L) 04/27/2023   HCT 37.4 (L) 04/27/2023   MCV 92.3 04/27/2023   PLT 243 04/27/2023      Chemistry      Component Value Date/Time   NA 140 03/30/2023 0939   K 4.4 03/30/2023 0939   CL 101 03/30/2023 0939   CO2 30 03/30/2023 0939   BUN 33 (H) 03/30/2023 0939   CREATININE 1.83 (H) 03/30/2023 0939      Component Value Date/Time   CALCIUM 9.8 03/30/2023 0939   ALKPHOS 64 03/30/2023 0939   AST 28 03/30/2023 0939   ALT 24 03/30/2023 0939   BILITOT 0.8 03/30/2023 0939       RADIOGRAPHIC STUDIES: DG Chest Port 1 View Result Date: 04/17/2023 CLINICAL DATA:  Status post bronchoscopy, history of renal cell carcinoma with pulmonary metastases EXAM: PORTABLE CHEST 1 VIEW COMPARISON:  03/30/2023 FINDINGS: Single frontal view of the chest demonstrates stable enlargement of the cardiac silhouette, with chronic pulmonary  vascular congestion. The scattered pulmonary nodule seen on prior chest CT are more difficult to appreciate by x-ray. No new consolidation, effusion, or pneumothorax. No acute fracture. IMPRESSION: 1. No evidence of complication after bronchoscopy. 2. Stable enlarged cardiac silhouette and pulmonary vascular congestion. 3. Numerous pulmonary nodules seen on prior chest CT are more difficult to visualized by radiograph. Electronically Signed   By: Sharlet Salina M.D.   On: 04/17/2023 16:35   DG C-ARM BRONCHOSCOPY Result Date: 04/17/2023 C-ARM BRONCHOSCOPY:  Fluoroscopy was utilized by the requesting physician.  No radiographic interpretation.   CT CHEST ABDOMEN PELVIS WO CONTRAST Result Date: 04/03/2023 CLINICAL DATA:  Staging of renal cell carcinoma, right nephrectomy 2 years ago. Asymptomatic. * Tracking Code: BO * EXAM: CT CHEST, ABDOMEN AND PELVIS WITHOUT CONTRAST TECHNIQUE: Multidetector CT imaging of the chest, abdomen and pelvis was performed following the standard protocol without IV contrast. RADIATION DOSE REDUCTION: This exam was performed according to the departmental dose-optimization program which includes automated exposure control, adjustment of the mA and/or kV according to patient size and/or use of iterative reconstruction technique. COMPARISON:  09/22/2022 FINDINGS: CT CHEST FINDINGS Cardiovascular: Aortic atherosclerosis. Ascending aortic dilatation at 4.4 cm on 28/2 is similar. Tortuous descending thoracic aorta. Moderate cardiomegaly. Aortic valve calcification. Multivessel coronary artery atherosclerosis. Pulmonary valve replacement with pulmonary artery enlargement, relatively similar. Mediastinum/Nodes: Thyroidectomy. No supraclavicular adenopathy. Upper normal subcarinal node at 1.3 cm is unchanged. Hilar regions poorly evaluated without intravenous contrast. Tiny hiatal hernia. Lungs/Pleura: Bilateral pleural thickening with right-sided pleural calcification. The right pleural thickening is similar and the left pleural thickening is new. Bilateral pulmonary nodules. Index posterior right middle lobe spiculated nodule measures 9 x 8 mm on 73/4 versus 7 x 8 mm on the prior. Index subpleural right lower lobe nodule measures 4 mm on 115/4, similar. Index left upper lobe pulmonary nodule measures 10 x 10 mm on 36/4 versus 9 x 9 mm when remeasured in a similar fashion on the prior. Index anterior left lower lobe nodule measures 5 mm on 94/4 and is similar. More posterior and lateral subpleural 7 mm nodule is unchanged on 113/4.  Musculoskeletal: Included within the abdomen pelvic section. CT ABDOMEN PELVIS FINDINGS Hepatobiliary: Normal noncontrast appearance of the liver. Dependent gallstones without acute cholecystitis or biliary duct dilatation. Pancreas: Normal, without mass or ductal dilatation. Spleen: Normal in size, without focal abnormality. Adrenals/Urinary Tract: Normal adrenal glands. Right nephrectomy, without local recurrence. Punctate lower pole left renal collecting system calculus. Fluid density left renal lesions of maximally 3.5 cm are incompletely characterized but likely cysts. Parenchymal calcification in the upper pole left kidney of 3 mm may be dystrophic or related to a tiny complex cyst. No hydronephrosis. Normal urinary bladder. Stomach/Bowel: Normal remainder of the stomach. Extensive colonic diverticulosis. Normal terminal ileum and appendix. Normal small bowel. Vascular/Lymphatic: Advanced aortic and branch vessel atherosclerosis. No abdominopelvic adenopathy. Reproductive: Normal prostate. Other: New small volume pelvic fluid. Small fat containing left inguinal hernia. Musculoskeletal: Prior median sternotomy. IMPRESSION: CT CHEST IMPRESSION 1. Pulmonary metastasis are primarily similar. 2 of the larger nodules have minimally enlarged, suggesting mild progression. 2. Similar borderline mediastinal adenopathy, most likely reactive. 3. Ascending aortic aneurysm, similar at 4.4 cm. Recommend annual imaging followup by CTA or MRA. This recommendation follows 2010 ACCF/AHA/AATS/ACR/ASA/SCA/SCAI/SIR/STS/SVM Guidelines for the Diagnosis and Management of Patients with Thoracic Aortic Disease. Circulation. 2010; 121: I696-E952. Aortic aneurysm NOS (ICD10-I71.9) 4. Pulmonary artery enlargement suggests pulmonary arterial hypertension. 5. Coronary artery atherosclerosis. Aortic Atherosclerosis (ICD10-I70.0). CT ABDOMEN AND PELVIS  IMPRESSION 1. Right nephrectomy, without recurrent or metastatic disease. 2. Left  nephrolithiasis 3. Cholelithiasis 4. New small volume pelvic fluid, nonspecific. Electronically Signed   By: Jeronimo Greaves M.D.   On: 04/03/2023 11:38    ASSESSMENT AND PLAN: This is a very pleasant 77  years old white male with Stage I (T1a, N0, M0) clear-cell renal cell carcinoma diagnosed in 2022. He is status post robotic assisted laparoscopic right radical nephrectomy on June 26, 2020 and the final pathology showed clear-cell renal cell carcinoma nuclear grade 2 measuring 4.0 cm. The patient was also recently diagnosed with carcinoid tumor of the lung presented with bilateral pulmonary nodules in December 2024. I had a lengthy discussion with the patient and his wife about his recent diagnosis and treatment options.     Carcinoid Tumor of the Lung Diagnosed via recent bronchoscopy. This is a slowly growing malignancy, distinct from previous renal cell carcinoma. The tumor is asymptomatic. Discussed DOTATATE PET scan to assess tumor activity. Explained that many patients with this condition die from other causes rather than the tumor itself. No immediate treatment required unless symptoms develop. - Order DOTATATE PET scan within the next few weeks - Follow-up in one month to review scan results - Subsequent follow-ups every six months  Chronic Obstructive Pulmonary Disease (COPD) COPD contributing to respiratory symptoms, exacerbated by recent bronchoscopy, leading to vomiting and diarrhea for three days. Currently on azithromycin for symptom management. - Continue azithromycin for three more days  Pulmonary Hypertension Managed as part of overall pulmonary health. - Continue current management  Stage 1 Clear Cell Renal Cell Carcinoma Diagnosed in February 2022, underwent right nephrectomy. Currently under observation with no active treatment required. - Continue observation.   The patient was advised to call immediately if he has any concerning symptoms in the interval. The  patient voices understanding of current disease status and treatment options and is in agreement with the current care plan.  All questions were answered. The patient knows to call the clinic with any problems, questions or concerns. We can certainly see the patient much sooner if necessary.  The total time spent in the appointment was 30 minutes.  Disclaimer: This note was dictated with voice recognition software. Similar sounding words can inadvertently be transcribed and may not be corrected upon review.

## 2023-04-28 NOTE — Progress Notes (Signed)
The proposed treatment discussed in conference is for discussion purpose only and is not a binding recommendation.  The patients have not been physically examined, or presented with their treatment options.  Therefore, final treatment plans cannot be decided.  

## 2023-05-04 ENCOUNTER — Inpatient Hospital Stay: Payer: Medicare Other | Admitting: Internal Medicine

## 2023-05-04 ENCOUNTER — Telehealth: Payer: Self-pay | Admitting: *Deleted

## 2023-05-04 ENCOUNTER — Inpatient Hospital Stay: Payer: Medicare Other | Attending: Internal Medicine

## 2023-05-04 NOTE — Telephone Encounter (Signed)
I called and spoke with the pt  He states that he finished zpack given by Maralyn Sago on 04/25/23  He is still having issues with sinus congestion  He has nasal congestion when he lies down and is having to sleep propped up  He is not coughing, wheezing, denies fevers, aches   I confirmed that this encounter is not related to CPAP use as stated in original msg.   Tammy, please advise, thanks!

## 2023-05-04 NOTE — Telephone Encounter (Signed)
Would begin Zyrtec 10mg  At bedtime  for 7 days then As needed   Saline nasal rinse Twice daily   Begin Flonase 2 puffs Twice daily  for 1 week then As needed    If he is not coughing or wheezing. No discolored mucus will hold off on additional abx at this time  Please contact office for sooner follow up if symptoms do not improve or worsen or seek emergency care

## 2023-05-04 NOTE — Telephone Encounter (Signed)
Spoke with the pt and notified of response per Tammy  He verbalized understanding  Nothing further needed

## 2023-05-04 NOTE — Telephone Encounter (Signed)
PT states he saw Ms. Clent Ridges for a appt where she recom a CPAP to hopefully help his issue.He had a bronchoscopy this year.   He states the CPAP has made it worse. He can not wait for a late Jan appt with her because he is losing sleep. Would she be willing to see him sooner. Please call him @ [phone]

## 2023-05-04 NOTE — Telephone Encounter (Signed)
Patient's wife is calling. She wanted to check-in to see if there is something that can be done for that patient and his breathing issues.

## 2023-05-05 ENCOUNTER — Telehealth: Payer: Self-pay | Admitting: Genetic Counselor

## 2023-05-05 NOTE — Telephone Encounter (Signed)
Called patient (per request of Dr. Arbutus Ped) to recommend genetic counseling/testing given PH melanoma and other cancers and FH pancreatic cancer in father.  Patient interested in proceeding.  Appt scheduled for 2/20 at 11am at [facility].

## 2023-05-12 ENCOUNTER — Encounter (HOSPITAL_COMMUNITY): Payer: Medicare Other

## 2023-05-12 ENCOUNTER — Ambulatory Visit (HOSPITAL_BASED_OUTPATIENT_CLINIC_OR_DEPARTMENT_OTHER)
Admission: RE | Admit: 2023-05-12 | Discharge: 2023-05-12 | Disposition: A | Discharge status: Home / Self Care | Payer: Medicare Other | Source: Ambulatory Visit | Attending: Internal Medicine | Admitting: Internal Medicine

## 2023-05-12 ENCOUNTER — Other Ambulatory Visit (HOSPITAL_BASED_OUTPATIENT_CLINIC_OR_DEPARTMENT_OTHER): Payer: Self-pay | Admitting: Internal Medicine

## 2023-05-12 DIAGNOSIS — R059 Cough, unspecified: Secondary | ICD-10-CM | POA: Diagnosis present

## 2023-05-18 ENCOUNTER — Telehealth (HOSPITAL_COMMUNITY): Payer: Self-pay | Admitting: Internal Medicine

## 2023-05-18 ENCOUNTER — Telehealth: Payer: Self-pay | Admitting: Medical Oncology

## 2023-05-18 NOTE — Telephone Encounter (Signed)
 Dototate scan cancelled due to pt illness. He will r/s f/u after his Dotatate scan is scheduled. Being treated for URI viral /bacterial infection.

## 2023-05-18 NOTE — Telephone Encounter (Signed)
 I called Christopher Mosley today about rescheduling his Dotatate PET scan.  He stated he was still not feeling well and would call me back to reschedule when he is feeling better

## 2023-05-23 ENCOUNTER — Ambulatory Visit: Payer: Medicare Other | Admitting: Rehabilitation

## 2023-05-24 NOTE — Therapy (Signed)
OUTPATIENT PHYSICAL THERAPY  LOWER EXTREMITY ONCOLOGY EVALUATION  Patient Name: Christopher Mosley MRN: [id number] DOB:1945/08/29, 78 y.o., male Today's Date: 05/25/2023  END OF SESSION:  PT End of Session - 05/25/23 1152     Visit Number 1    Number of Visits 9    Date for PT Re-Evaluation 06/22/23    Authorization Type needed    PT Start Time 1048    PT Stop Time 1148    PT Time Calculation (min) 60 min    Activity Tolerance Patient tolerated treatment well    Behavior During Therapy [facility] for tasks assessed/performed             Past Medical History:  Diagnosis Date   Arthritis    Colon polyps 09/08/2017   q 5 years; need records   Complication of anesthesia    Heart murmur    History of hepatitis B    Hyperlipemia    Hypertension    Hypothyroidism    Lesion of vocal cord 02/06/2017   Right granuloma; resolved on f/u   Melanoma (HCC)    Papillary thyroid carcinoma (HCC)    s/p thyroidectomy,kidney,melanomas(2)   PONV (postoperative nausea and vomiting)    Urine incontinence    Past Surgical History:  Procedure Laterality Date   BLADDER SURGERY  2016   BLALOCK PROCEDURE  1954   BRONCHIAL BIOPSY  04/17/2023   Procedure: BRONCHIAL BIOPSIES;  Surgeon: [name], MD;  Location: [facility] ENDOSCOPY;  Service: Pulmonary;;   BRONCHIAL BRUSHINGS  04/17/2023   Procedure: BRONCHIAL BRUSHINGS;  Surgeon: [name], MD;  Location: [facility] ENDOSCOPY;  Service: Pulmonary;;   BRONCHIAL NEEDLE ASPIRATION BIOPSY  04/17/2023   Procedure: BRONCHIAL NEEDLE ASPIRATION BIOPSIES;  Surgeon: [name], MD;  Location: [facility] ENDOSCOPY;  Service: Pulmonary;;   COLONOSCOPY     multiple - hx adenomas 2010 and 2015   CYSTOSCOPY N/A 06/26/2020   Procedure: FLEXIBLE CYSTOSCOPY FOR BLADDER STONE AND PLACEMENT OF FOLEY;  Surgeon: Jannifer Hick, MD;  Location: [facility] ORS;  Service: Urology;  Laterality: N/A;   MELANOMA EXCISION     Pottts shunt  1957   PULMONARY VALVE REPLACEMENT  10/21/2016    pig valve   ROBOTIC ASSITED PARTIAL NEPHRECTOMY Right 06/26/2020   Procedure: XI ROBOTIC ASSITED LAPAROSCOPIC RADICAL NEPHRECTOMY;  Surgeon: Jannifer Hick, MD;  Location: [facility] ORS;  Service: Urology;  Laterality: Right;   TETRALOGY OF FALLOT REPAIR  1961   TOTAL KNEE ARTHROPLASTY Left 2016   TOTAL THYROIDECTOMY  2002   VIDEO BRONCHOSCOPY WITH ENDOBRONCHIAL ULTRASOUND Bilateral 04/17/2023   Procedure: VIDEO BRONCHOSCOPY WITH ENDOBRONCHIAL ULTRASOUND;  Surgeon: [name], MD;  Location: [facility] ENDOSCOPY;  Service: Pulmonary;  Laterality: Bilateral;   Patient Active Problem List   Diagnosis Date Noted   Pulmonary nodules 04/14/2023   Shortness of breath 04/14/2023   Cancer of kidney (HCC) 11/17/2020   Renal mass 06/26/2020   Erectile dysfunction 10/30/2017   History of thyroid cancer 09/08/2017   Mixed hyperlipidemia 09/08/2017   Essential hypertension 09/08/2017   Colon polyps 09/08/2017   Vocal cord dysfunction 03/29/2017   History of Blalock-Taussig shunt 12/15/2015   Tetralogy of Fallot s/p repair 12/15/2015   Hypogonadism in male 08/12/2015   Postoperative hypothyroidism 08/12/2015      REFERRING PROVIDER: Dorinda Hill, MD  REFERRING DIAG:  Diagnosis  R60.0 (ICD-10-CM) - Bilateral leg edema    THERAPY DIAG:  Malignant neoplasm of right kidney [facility])  Aftercare following surgery for neoplasm  Pulmonary nodules  Lymphedema, not elsewhere classified  ONSET DATE: 2022  Rationale for Evaluation and Treatment: Rehabilitation  SUBJECTIVE:                                                                                                                                                                                           SUBJECTIVE STATEMENT: It is swelling up to my thighs on both sides.  It started back in 2022 after I had my kidney cancer very mildly.  It started up bad right after my lung biopsy 04/17/23.  Denies SOB, orthopnea, has normal urine output.  Reports legs  look normal in the morning.   PERTINENT HISTORY: Has been evaluated by cardiology and given torsemide without improvement. Urine protein WNL, Deemed stable by cardiology and nephrology per pt and recent referring MD. Malignant carcinoid tumor of the bil bronchus and lung diagnosed 04/2023 - no treatment needed unless symptoms develop, Stage 1 renal cell carcinoma diagnosed in 2022 with Rt radical nephrectomy, CKD stage 3b - BUN/creatinine stable GFR of 40. 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  Hx of melanoma x 2, thyroid cancer and multiple heart surgery.    PAIN:  Are you having pain? Mainly stiff in the legs.  OA in the hands  PRECAUTIONS: lymphedema bil  RED FLAGS: None   WEIGHT BEARING RESTRICTIONS: No  FALLS:  Has patient fallen in last 6 months? No  LIVING ENVIRONMENT: Lives with: lives with their family and lives with their spouse  OCCUPATION: retired   LEISURE: weights and walking  PRIOR LEVEL OF FUNCTION: Independent  PATIENT GOALS: What to do for the swelling    OBJECTIVE: Note: Objective measures were completed at Evaluation unless otherwise noted.  COGNITION: Overall cognitive status: Within functional limits for tasks assessed   PALPATION: +2 pitting Rt: foot to knee and distal medial thigh at hamstring, Lt: to knee   OBSERVATIONS / OTHER ASSESSMENTS: large melanoma removal from Lt leg above knee medially and Rt skin graft on thigh, Lt TKR, legs appear equal in size but swollen  LOWER EXTREMITY STRENGTH: LYMPHEDEMA ASSESSMENTS:   LOWER EXTREMITY LANDMARK RIGHT eval  At groin   30 cm proximal to suprapatella   20 cm proximal to suprapatella 51.3  10 cm proximal to suprapatella 43  At midpatella / popliteal crease 39  30 cm proximal to floor at lateral plantar foot 43.8  20 cm proximal to floor at lateral plantar foot 37.5  10 cm proximal to floor at lateral  plantar foot 26.3  Circumference  of ankle/heel 32.3  5 cm proximal to 1st MTP joint 22.4  Across MTP joint 22.1  Around proximal great toe 8.5  (Blank rows = not tested)  LOWER EXTREMITY LANDMARK LEFT eval  At groin   30 cm proximal to suprapatella   20 cm proximal to suprapatella 51.3  10 cm proximal to suprapatella 44.9  At midpatella / popliteal crease 41.6  30 cm proximal to floor at lateral plantar foot 42.7  20 cm proximal to floor at lateral plantar foot 35.6  10 cm proximal to floor at lateral plantar foot 26.5  Circumference of ankle/heel 33  5 cm proximal to 1st MTP joint 23.3  Across MTP joint 22.6  Around proximal great toe 8.4  (Blank rows = not tested)  GAIT: Distance walked: WNL from lobby to room                                                                                                                           TREATMENT DATE:  05/25/23 Discussed garment options thigh high vs knee high.  Pt refers knee high Decided on medi plus size 5 regular, closed toe, black with pt given handout and link to [employer] for self pay due to insurance type.  Also included link for jobst large calf donning device, self MLD for the bil Les video link, and how to donn stocking link Discussed vibration plate as pt has one and dry brushing which pt does, elevation and trying to take his shower at night so he can get his stockings on before they swell.    PATIENT EDUCATION:  Education details: per today's note  Person educated: Patient Education method: Programmer, multimedia, Facilities manager, Verbal cues, and Handouts Education comprehension: verbalized understanding, returned demonstration, and needs further education  HOME EXERCISE PROGRAM:   ASSESSMENT:  CLINICAL IMPRESSION: Patient is a 78 y.o. male who was seen today for physical therapy evaluation and treatment for her bilateral LE lymphedema.  He has multiple reasons for lymphedema including hx of polio, heart dysfunction, kidney  cancer with removal of 1 kidney, melanoma surgery on the Lt leg and neck dissection with thyroid cancer.  Despite all of these medical issues he is active with exercise, dry brushing, and vibration plate.  He had mild edema until his lung biopsy around 1 month ago and now has significant edema which is gone overnight and returns quickly after he showers in the morning.  The edema is very soft pitting which may have mostly kidney clearance origins but his MD has said he is stable.  Urine was recently cleared for protein dysfunction.  Due to his normal limbs in the morning we decided to start with compression knee high due to preference with education on what to order, donning, elevation, exercise, and MLD. Pt does not like massage but will attend PT 1x per week x 4 weeks to learn self MLD and make sure his new garments are working.    OBJECTIVE IMPAIRMENTS: decreased activity  tolerance, decreased knowledge of condition, decreased knowledge of use of DME, decreased ROM, and increased edema.   ACTIVITY LIMITATIONS: normal exercise levels  PARTICIPATION LIMITATIONS: none  PERSONAL FACTORS: Age, Past/current experiences, and 1-2 comorbidities: multiple health factors  are also affecting patient's functional outcome.   REHAB POTENTIAL: Good  CLINICAL DECISION MAKING: Evolving/moderate complexity  EVALUATION COMPLEXITY: Moderate   GOALS: Goals reviewed with patient? Yes  SHORT TERM GOALS+LTGs: Target date: 06/22/23  Pt will obtain compression for bil legs to manage edema Baseline: Goal status: INITIAL  2.  Pt will be ind with self MLD and knowledgeable about pump options Baseline:  Goal status: PARTIALLY MET - gave pump info 05/25/23   PLAN:  PT FREQUENCY: 1x/week  PT DURATION: 4 weeks  PLANNED INTERVENTIONS: 97110-Therapeutic exercises, 97535- Self Care, 29562- Manual therapy, Patient/Family education, and Manual lymph drainage  PLAN FOR NEXT SESSION: start to teach MLD for bil LE- pt  has link from the CANCER PT via [employer] so we can go from that.    Idamae Lusher, PT 05/25/2023, 11:53 AM

## 2023-05-25 ENCOUNTER — Ambulatory Visit: Payer: Medicare Other | Attending: Internal Medicine | Admitting: Rehabilitation

## 2023-05-25 ENCOUNTER — Other Ambulatory Visit: Payer: Self-pay

## 2023-05-25 ENCOUNTER — Encounter: Payer: Self-pay | Admitting: Rehabilitation

## 2023-05-25 DIAGNOSIS — R918 Other nonspecific abnormal finding of lung field: Secondary | ICD-10-CM | POA: Insufficient documentation

## 2023-05-25 DIAGNOSIS — I890 Lymphedema, not elsewhere classified: Secondary | ICD-10-CM | POA: Diagnosis not present

## 2023-05-25 DIAGNOSIS — C641 Malignant neoplasm of right kidney, except renal pelvis: Secondary | ICD-10-CM | POA: Diagnosis not present

## 2023-05-25 DIAGNOSIS — R600 Localized edema: Secondary | ICD-10-CM | POA: Diagnosis present

## 2023-05-25 DIAGNOSIS — Z483 Aftercare following surgery for neoplasm: Secondary | ICD-10-CM | POA: Insufficient documentation

## 2023-05-26 ENCOUNTER — Telehealth (HOSPITAL_COMMUNITY): Payer: Self-pay | Admitting: Internal Medicine

## 2023-05-26 NOTE — Telephone Encounter (Signed)
I called patient to follow up on rescheduling his Dotatate PET scan.  He stated he has 7 more days of abx and will call back to reschedule once he finishes this medication

## 2023-05-31 ENCOUNTER — Ambulatory Visit: Payer: Medicare Other | Admitting: Rehabilitation

## 2023-05-31 ENCOUNTER — Encounter: Payer: Self-pay | Admitting: Rehabilitation

## 2023-05-31 DIAGNOSIS — I890 Lymphedema, not elsewhere classified: Secondary | ICD-10-CM

## 2023-05-31 DIAGNOSIS — Z483 Aftercare following surgery for neoplasm: Secondary | ICD-10-CM

## 2023-05-31 DIAGNOSIS — R918 Other nonspecific abnormal finding of lung field: Secondary | ICD-10-CM

## 2023-05-31 DIAGNOSIS — C641 Malignant neoplasm of right kidney, except renal pelvis: Secondary | ICD-10-CM

## 2023-05-31 NOTE — Patient Instructions (Signed)
Deep Effective Breath   1.)  Standing, sitting, or laying down place both hands on the belly. Take a deep breath IN, expanding the belly; then breath OUT, contracting the belly. Repeat __5-10__ times.     2.) On both sides, at armpits and groin, make _5-10__ in-place circles.   1 leg at a time:   3.) Pump up outer thigh of involved leg from knee to outer hip.   4.) Next, interlace fingers behind knee IF ABLE and make in-place circles.      5.) Hands on sides of ankle of involved leg working from ankle to knee    FOOT: Dorsum of Foot and Toes Massage   6.) One hand on top of foot make _5_ stationary circles or pumps, then either on top of toes or each individual toe do _5_ pumps.   7.) Then retrace all steps pumping back up both sides of lower leg, outer thigh, and then pathway.   8.) Finish with what you started with, _5_ circles at arm pits and groin. All _2-3_ times at each sequence.

## 2023-05-31 NOTE — Therapy (Signed)
OUTPATIENT PHYSICAL THERAPY  LOWER EXTREMITY ONCOLOGY  Patient Name: Christopher Mosley MRN: [id number] DOB:[birth year], 78 y.o., male Today's Date: 05/31/2023  END OF SESSION:  PT End of Session - 05/31/23 0754     Visit Number 2    Number of Visits 9    Date for PT Re-Evaluation 06/22/23    PT Start Time 0800    PT Stop Time 0836    PT Time Calculation (min) 36 min    Activity Tolerance Patient tolerated treatment well    Behavior During Therapy [facility] for tasks assessed/performed             Past Medical History:  Diagnosis Date   Arthritis    Colon polyps 09/08/2017   q 5 years; need records   Complication of anesthesia    Heart murmur    History of hepatitis B    Hyperlipemia    Hypertension    Hypothyroidism    Lesion of vocal cord 02/06/2017   Right granuloma; resolved on f/u   Melanoma (HCC)    Papillary thyroid carcinoma (HCC)    s/p thyroidectomy,kidney,melanomas(2)   PONV (postoperative nausea and vomiting)    Urine incontinence    Past Surgical History:  Procedure Laterality Date   BLADDER SURGERY  2016   BLALOCK PROCEDURE  1954   BRONCHIAL BIOPSY  04/17/2023   Procedure: BRONCHIAL BIOPSIES;  Surgeon: [name], MD;  Location: [facility] ENDOSCOPY;  Service: Pulmonary;;   BRONCHIAL BRUSHINGS  04/17/2023   Procedure: BRONCHIAL BRUSHINGS;  Surgeon: [name], MD;  Location: [facility] ENDOSCOPY;  Service: Pulmonary;;   BRONCHIAL NEEDLE ASPIRATION BIOPSY  04/17/2023   Procedure: BRONCHIAL NEEDLE ASPIRATION BIOPSIES;  Surgeon: [name], MD;  Location: [facility] ENDOSCOPY;  Service: Pulmonary;;   COLONOSCOPY     multiple - hx adenomas 2010 and 2015   CYSTOSCOPY N/A 06/26/2020   Procedure: FLEXIBLE CYSTOSCOPY FOR BLADDER STONE AND PLACEMENT OF FOLEY;  Surgeon: Jannifer Hick, MD;  Location: [facility] ORS;  Service: Urology;  Laterality: N/A;   MELANOMA EXCISION     Pottts shunt  1957   PULMONARY VALVE REPLACEMENT  10/21/2016   pig valve   ROBOTIC ASSITED PARTIAL  NEPHRECTOMY Right 06/26/2020   Procedure: XI ROBOTIC ASSITED LAPAROSCOPIC RADICAL NEPHRECTOMY;  Surgeon: Jannifer Hick, MD;  Location: [facility] ORS;  Service: Urology;  Laterality: Right;   TETRALOGY OF FALLOT REPAIR  1961   TOTAL KNEE ARTHROPLASTY Left 2016   TOTAL THYROIDECTOMY  2002   VIDEO BRONCHOSCOPY WITH ENDOBRONCHIAL ULTRASOUND Bilateral 04/17/2023   Procedure: VIDEO BRONCHOSCOPY WITH ENDOBRONCHIAL ULTRASOUND;  Surgeon: [name], MD;  Location[facility] ENDOSCOPY;  Service: Pulmonary;  Laterality: Bilateral;   Patient Active Problem List   Diagnosis Date Noted   Pulmonary nodules 04/14/2023   Shortness of breath 04/14/2023   Cancer of kidney (HCC) 11/17/2020   Renal mass 06/26/2020   Erectile dysfunction 10/30/2017   History of thyroid cancer 09/08/2017   Mixed hyperlipidemia 09/08/2017   Essential hypertension 09/08/2017   Colon polyps 09/08/2017   Vocal cord dysfunction 03/29/2017   History of Blalock-Taussig shunt 12/15/2015   Tetralogy of Fallot s/p repair 12/15/2015   Hypogonadism in male 08/12/2015   Postoperative hypothyroidism 08/12/2015      REFERRING PROVIDER: Dorinda Hill, MD  REFERRING DIAG:  Diagnosis  R60.0 (ICD-10-CM) - Bilateral leg edema    THERAPY DIAG:  Malignant neoplasm of right kidney [facility])  Aftercare following surgery for neoplasm  Pulmonary nodules  Lymphedema, not elsewhere  classified  ONSET DATE: 2022  Rationale for Evaluation and Treatment: Rehabilitation  SUBJECTIVE:                                                                                                                                                                                           SUBJECTIVE STATEMENT:  Nothing new. I ordered my stuff  EVAL: It is swelling up to my thighs on both sides.  It started back in 2022 after I had my kidney cancer very mildly.  It started up bad right after my lung biopsy 04/17/23.  Denies SOB, orthopnea, has normal urine output.  Reports legs  look normal in the morning.   PERTINENT HISTORY: Has been evaluated by cardiology and given torsemide without improvement. Urine protein WNL, Deemed stable by cardiology and nephrology per pt and recent referring MD. Malignant carcinoid tumor of the bil bronchus and lung diagnosed 04/2023 - no treatment needed unless symptoms develop, Stage 1 renal cell carcinoma diagnosed in 2022 with Rt radical nephrectomy, CKD stage 3b - BUN/creatinine stable GFR of 40. 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  Hx of melanoma x 2, thyroid cancer and multiple heart surgery.    PAIN:  Are you having pain? Mainly stiff in the legs.  OA in the hands  PRECAUTIONS: lymphedema bil  RED FLAGS: None   WEIGHT BEARING RESTRICTIONS: No  FALLS:  Has patient fallen in last 6 months? No  LIVING ENVIRONMENT: Lives with: lives with their family and lives with their spouse  OCCUPATION: retired   LEISURE: weights and walking  PRIOR LEVEL OF FUNCTION: Independent  PATIENT GOALS: What to do for the swelling    OBJECTIVE: Note: Objective measures were completed at Evaluation unless otherwise noted.  COGNITION: Overall cognitive status: Within functional limits for tasks assessed   PALPATION: +2 pitting Rt: foot to knee and distal medial thigh at hamstring, Lt: to knee   OBSERVATIONS / OTHER ASSESSMENTS: large melanoma removal from Lt leg above knee medially and Rt skin graft on thigh, Lt TKR, legs appear equal in size but swollen  LOWER EXTREMITY STRENGTH: LYMPHEDEMA ASSESSMENTS:   LOWER EXTREMITY LANDMARK RIGHT eval  At groin   30 cm proximal to suprapatella   20 cm proximal to suprapatella 51.3  10 cm proximal to suprapatella 43  At midpatella / popliteal crease 39  30 cm proximal to floor at lateral plantar foot 43.8  20 cm proximal to floor at lateral plantar foot 37.5  10 cm proximal to floor at lateral  plantar foot  26.3  Circumference of ankle/heel 32.3  5 cm proximal to 1st MTP joint 22.4  Across MTP joint 22.1  Around proximal great toe 8.5  (Blank rows = not tested)  LOWER EXTREMITY LANDMARK LEFT eval  At groin   30 cm proximal to suprapatella   20 cm proximal to suprapatella 51.3  10 cm proximal to suprapatella 44.9  At midpatella / popliteal crease 41.6  30 cm proximal to floor at lateral plantar foot 42.7  20 cm proximal to floor at lateral plantar foot 35.6  10 cm proximal to floor at lateral plantar foot 26.5  Circumference of ankle/heel 33  5 cm proximal to 1st MTP joint 23.3  Across MTP joint 22.6  Around proximal great toe 8.4  (Blank rows = not tested)  GAIT: Distance walked: WNL from lobby to room                                                                                                                           TREATMENT DATE:  05/31/23 Self MLD edu per instruction section in seated with PT demonstrating each step and then pt performing.  Pt already does dry brushing so he has a good understanding already.  Able to reach all sections of his leg.  Just needing vcs for pressure and using full hand vs fingertips.    05/25/23 Discussed garment options thigh high vs knee high.  Pt refers knee high Decided on medi plus size 5 regular, closed toe, black with pt given handout and link to [employer] for self pay due to insurance type.  Also included link for jobst large calf donning device, self MLD for the bil Les video link, and how to donn stocking link Discussed vibration plate as pt has one and dry brushing which pt does, elevation and trying to take his shower at night so he can get his stockings on before they swell.    PATIENT EDUCATION:  Education details: per today's note  Person educated: Patient Education method: Programmer, multimedia, Facilities manager, Verbal cues, and Handouts Education comprehension: verbalized understanding, returned demonstration, and needs further  education  HOME EXERCISE PROGRAM: Self MLD, dry brush  ASSESSMENT:  CLINICAL IMPRESSION: Pt should be ind with MLD as he has been doing similar already.  We will check the fit and effectiveness of his compression and go from there.   OBJECTIVE IMPAIRMENTS: decreased activity tolerance, decreased knowledge of condition, decreased knowledge of use of DME, decreased ROM, and increased edema.   ACTIVITY LIMITATIONS: normal exercise levels  PARTICIPATION LIMITATIONS: none  PERSONAL FACTORS: Age, Past/current experiences, and 1-2 comorbidities: multiple health factors  are also affecting patient's functional outcome.   REHAB POTENTIAL: Good  CLINICAL DECISION MAKING: Evolving/moderate complexity  EVALUATION COMPLEXITY: Moderate   GOALS: Goals reviewed with patient? Yes  SHORT TERM GOALS+LTGs: Target date: 06/22/23  Pt will obtain compression for bil legs to manage edema Baseline: Goal status: INITIAL  2.  Pt will be ind with self MLD  and knowledgeable about pump options Baseline:  Goal status: PARTIALLY MET - gave pump info 05/25/23   PLAN:  PT FREQUENCY: 1x/week  PT DURATION: 4 weeks  PLANNED INTERVENTIONS: 97110-Therapeutic exercises, 97535- Self Care, 47425- Manual therapy, Patient/Family education, and Manual lymph drainage  PLAN FOR NEXT SESSION: start to teach MLD for bil LE- pt has link from the CANCER PT via [employer] so we can go from that.    Idamae Lusher, PT 05/31/2023, 8:39 AM

## 2023-06-05 ENCOUNTER — Telehealth (HOSPITAL_COMMUNITY): Payer: Self-pay | Admitting: Internal Medicine

## 2023-06-05 NOTE — Telephone Encounter (Signed)
I called patient to follow up on Dotatate Pet scan appt.  He stated he was having issues with his Lymphadema and had 3 more therapy sessions to complete and he would call back to reschedule

## 2023-06-06 ENCOUNTER — Encounter: Payer: Self-pay | Admitting: Rehabilitation

## 2023-06-06 ENCOUNTER — Ambulatory Visit: Payer: Medicare Other | Admitting: Rehabilitation

## 2023-06-06 DIAGNOSIS — R918 Other nonspecific abnormal finding of lung field: Secondary | ICD-10-CM

## 2023-06-06 DIAGNOSIS — C641 Malignant neoplasm of right kidney, except renal pelvis: Secondary | ICD-10-CM

## 2023-06-06 DIAGNOSIS — Z483 Aftercare following surgery for neoplasm: Secondary | ICD-10-CM | POA: Diagnosis not present

## 2023-06-06 DIAGNOSIS — I890 Lymphedema, not elsewhere classified: Secondary | ICD-10-CM

## 2023-06-06 NOTE — Therapy (Signed)
OUTPATIENT PHYSICAL THERAPY  LOWER EXTREMITY ONCOLOGY  Patient Name: Jawaan Adachi MRN: [id number] DOB:May 06, 1946, 78 y.o., male Today's Date: 06/06/2023  END OF SESSION:  PT End of Session - 06/06/23 0844     Visit Number 3    Number of Visits 9    Date for PT Re-Evaluation 06/22/23    PT Start Time 0800    PT Stop Time 0845    PT Time Calculation (min) 45 min    Activity Tolerance Patient tolerated treatment well    Behavior During Therapy [facility] for tasks assessed/performed              Past Medical History:  Diagnosis Date   Arthritis    Colon polyps 09/08/2017   q 5 years; need records   Complication of anesthesia    Heart murmur    History of hepatitis B    Hyperlipemia    Hypertension    Hypothyroidism    Lesion of vocal cord 02/06/2017   Right granuloma; resolved on f/u   Melanoma (HCC)    Papillary thyroid carcinoma (HCC)    s/p thyroidectomy,kidney,melanomas(2)   PONV (postoperative nausea and vomiting)    Urine incontinence    Past Surgical History:  Procedure Laterality Date   BLADDER SURGERY  2016   BLALOCK PROCEDURE  1954   BRONCHIAL BIOPSY  04/17/2023   Procedure: BRONCHIAL BIOPSIES;  Surgeon: [name], MD;  Location: [facility] ENDOSCOPY;  Service: Pulmonary;;   BRONCHIAL BRUSHINGS  04/17/2023   Procedure: BRONCHIAL BRUSHINGS;  Surgeon: [name], MD;  Location: [facility] ENDOSCOPY;  Service: Pulmonary;;   BRONCHIAL NEEDLE ASPIRATION BIOPSY  04/17/2023   Procedure: BRONCHIAL NEEDLE ASPIRATION BIOPSIES;  Surgeon: [name], MD;  Location: [facility] ENDOSCOPY;  Service: Pulmonary;;   COLONOSCOPY     multiple - hx adenomas 2010 and 2015   CYSTOSCOPY N/A 06/26/2020   Procedure: FLEXIBLE CYSTOSCOPY FOR BLADDER STONE AND PLACEMENT OF FOLEY;  Surgeon: Jannifer Hick, MD;  Location: [facility] ORS;  Service: Urology;  Laterality: N/A;   MELANOMA EXCISION     Pottts shunt  1957   PULMONARY VALVE REPLACEMENT  10/21/2016   pig valve   ROBOTIC ASSITED PARTIAL  NEPHRECTOMY Right 06/26/2020   Procedure: XI ROBOTIC ASSITED LAPAROSCOPIC RADICAL NEPHRECTOMY;  Surgeon: Jannifer Hick, MD;  Location: [facility];  Service: Urology;  Laterality: Right;   TETRALOGY OF FALLOT REPAIR  1961   TOTAL KNEE ARTHROPLASTY Left 2016   TOTAL THYROIDECTOMY  2002   VIDEO BRONCHOSCOPY WITH ENDOBRONCHIAL ULTRASOUND Bilateral 04/17/2023   Procedure: VIDEO BRONCHOSCOPY WITH ENDOBRONCHIAL ULTRASOUND;  Surgeon: [name], MD;  Location: [facility] ENDOSCOPY;  Service: Pulmonary;  Laterality: Bilateral;   Patient Active Problem List   Diagnosis Date Noted   Pulmonary nodules 04/14/2023   Shortness of breath 04/14/2023   Cancer of kidney (HCC) 11/17/2020   Renal mass 06/26/2020   Erectile dysfunction 10/30/2017   History of thyroid cancer 09/08/2017   Mixed hyperlipidemia 09/08/2017   Essential hypertension 09/08/2017   Colon polyps 09/08/2017   Vocal cord dysfunction 03/29/2017   History of Blalock-Taussig shunt 12/15/2015   Tetralogy of Fallot s/p repair 12/15/2015   Hypogonadism in male 08/12/2015   Postoperative hypothyroidism 08/12/2015      REFERRING PROVIDER: Dorinda Hill, MD  REFERRING DIAG:  Diagnosis  R60.0 (ICD-10-CM) - Bilateral leg edema    THERAPY DIAG:  Malignant neoplasm of right kidney [facility])  Aftercare following surgery for neoplasm  Pulmonary nodules  Lymphedema, not  elsewhere classified  ONSET DATE: 2022  Rationale for Evaluation and Treatment: Rehabilitation  SUBJECTIVE:                                                                                                                                                                                           SUBJECTIVE STATEMENT:  I got my new stockings  EVAL: It is swelling up to my thighs on both sides.  It started back in 2022 after I had my kidney cancer very mildly.  It started up bad right after my lung biopsy 04/17/23.  Denies SOB, orthopnea, has normal urine output.  Reports legs look  normal in the morning.   PERTINENT HISTORY: Has been evaluated by cardiology and given torsemide without improvement. Urine protein WNL, Deemed stable by cardiology and nephrology per pt and recent referring MD. Malignant carcinoid tumor of the bil bronchus and lung diagnosed 04/2023 - no treatment needed unless symptoms develop, Stage 1 renal cell carcinoma diagnosed in 2022 with Rt radical nephrectomy, CKD stage 3b - BUN/creatinine stable GFR of 40. 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  Hx of melanoma x 2, thyroid cancer and multiple heart surgery.    PAIN:  Are you having pain? Mainly stiff in the legs.  OA in the hands  PRECAUTIONS: lymphedema bil  RED FLAGS: None   WEIGHT BEARING RESTRICTIONS: No  FALLS:  Has patient fallen in last 6 months? No  LIVING ENVIRONMENT: Lives with: lives with their family and lives with their spouse  OCCUPATION: retired   LEISURE: weights and walking  PRIOR LEVEL OF FUNCTION: Independent  PATIENT GOALS: What to do for the swelling    OBJECTIVE: Note: Objective measures were completed at Evaluation unless otherwise noted.  COGNITION: Overall cognitive status: Within functional limits for tasks assessed   PALPATION: +2 pitting Rt: foot to knee and distal medial thigh at hamstring, Lt: to knee   OBSERVATIONS / OTHER ASSESSMENTS: large melanoma removal from Lt leg above knee medially and Rt skin graft on thigh, Lt TKR, legs appear equal in size but swollen  LOWER EXTREMITY STRENGTH: LYMPHEDEMA ASSESSMENTS:   LOWER EXTREMITY LANDMARK RIGHT eval 06/06/23  At groin    30 cm proximal to suprapatella    20 cm proximal to suprapatella 51.3 53  10 cm proximal to suprapatella 43 44.7  At midpatella / popliteal crease 39 39.8  30 cm proximal to floor at lateral plantar foot 43.8   20 cm proximal to floor at lateral plantar foot 37.5   10 cm  proximal  to floor at lateral plantar foot 26.3   Circumference of ankle/heel 32.3   5 cm proximal to 1st MTP joint 22.4   Across MTP joint 22.1   Around proximal great toe 8.5   (Blank rows = not tested)  LOWER EXTREMITY LANDMARK LEFT eval 06/06/23  At groin    30 cm proximal to suprapatella    20 cm proximal to suprapatella 51.3 52.3  10 cm proximal to suprapatella 44.9 45.9  At midpatella / popliteal crease 41.6 39  30 cm proximal to floor at lateral plantar foot 42.7   20 cm proximal to floor at lateral plantar foot 35.6   10 cm proximal to floor at lateral plantar foot 26.5   Circumference of ankle/heel 33   5 cm proximal to 1st MTP joint 23.3   Across MTP joint 22.6   Around proximal great toe 8.4   (Blank rows = not tested)  GAIT: Distance walked: WNL from lobby to room                                                                                                                           TREATMENT DATE:  06/06/23 Pt arrives with new stockings which he likes and fit well but he noted that the fluid has moved into the thighs.   Remeasured thighs Helped pt choose thigh high stockings and sent link on abilico  Showed him capri options on [place] and abilico as well for another possible option.    05/31/23 Self MLD edu per instruction section in seated with PT demonstrating each step and then pt performing.  Pt already does dry brushing so he has a good understanding already.  Able to reach all sections of his leg.  Just needing vcs for pressure and using full hand vs fingertips.    05/25/23 Discussed garment options thigh high vs knee high.  Pt refers knee high Decided on medi plus size 5 regular, closed toe, black with pt given handout and link to abilico for self pay due to insurance type.  Also included link for jobst large calf donning device, self MLD for the bil Les video link, and how to donn stocking link Discussed vibration plate as pt has one and dry brushing which pt does,  elevation and trying to take his shower at night so he can get his stockings on before they swell.    PATIENT EDUCATION:  Education details: per today's note  Person educated: Patient Education method: Programmer, multimedia, Facilities manager, Verbal cues, and Handouts Education comprehension: verbalized understanding, returned demonstration, and needs further education  HOME EXERCISE PROGRAM: Self MLD, dry brush  ASSESSMENT:  CLINICAL IMPRESSION: Pts stockings fit well and are containing but have moved his fluid proximally with an increase of around 1-1.5cm bilaterally on both thighs.  No abdominal or genital edema. No SOB.  He is interested in trying thigh high stockings next and was given information on this.  He will self order.  Also discussed the pump and will send off for this after approval from his kidney and cardiac doctors.   OBJECTIVE IMPAIRMENTS: decreased activity tolerance, decreased knowledge of condition, decreased knowledge of use of DME, decreased ROM, and increased edema.   ACTIVITY LIMITATIONS: normal exercise levels  PARTICIPATION LIMITATIONS: none  PERSONAL FACTORS: Age, Past/current experiences, and 1-2 comorbidities: multiple health factors  are also affecting patient's functional outcome.   REHAB POTENTIAL: Good  CLINICAL DECISION MAKING: Evolving/moderate complexity  EVALUATION COMPLEXITY: Moderate   GOALS: Goals reviewed with patient? Yes  SHORT TERM GOALS+LTGs: Target date: 06/22/23  Pt will obtain compression for bil legs to manage edema Baseline: Goal status: INITIAL  2.  Pt will be ind with self MLD and knowledgeable about pump options Baseline:  Goal status: PARTIALLY MET - gave pump info 05/25/23   PLAN:  PT FREQUENCY: 1x/week  PT DURATION: 4 weeks  PLANNED INTERVENTIONS: 97110-Therapeutic exercises, 97535- Self Care, 16109- Manual therapy, Patient/Family education, and Manual lymph drainage  PLAN FOR NEXT SESSION: start to teach MLD for bil LE-  pt has link from the CANCER PT via abilico so we can go from that.    Idamae Lusher, PT 06/06/2023, 8:45 AM

## 2023-06-14 ENCOUNTER — Ambulatory Visit: Payer: Medicare Other | Attending: Internal Medicine | Admitting: Rehabilitation

## 2023-06-14 ENCOUNTER — Encounter: Payer: Self-pay | Admitting: Rehabilitation

## 2023-06-14 DIAGNOSIS — Z483 Aftercare following surgery for neoplasm: Secondary | ICD-10-CM

## 2023-06-14 DIAGNOSIS — I890 Lymphedema, not elsewhere classified: Secondary | ICD-10-CM

## 2023-06-14 DIAGNOSIS — C641 Malignant neoplasm of right kidney, except renal pelvis: Secondary | ICD-10-CM | POA: Diagnosis present

## 2023-06-14 DIAGNOSIS — R918 Other nonspecific abnormal finding of lung field: Secondary | ICD-10-CM

## 2023-06-14 NOTE — Therapy (Signed)
 OUTPATIENT PHYSICAL THERAPY  LOWER EXTREMITY ONCOLOGY  Patient Name: Christopher Mosley MRN: [id number] DOB:02/19/1946, 78 y.o., male Today's Date: 06/14/2023  END OF SESSION:  PT End of Session - 06/14/23 1106     Visit Number 4    Number of Visits 9    Date for PT Re-Evaluation 06/22/23    PT Start Time 0800    PT Stop Time 0830    PT Time Calculation (min) 30 min    Activity Tolerance Patient tolerated treatment well               Past Medical History:  Diagnosis Date   Arthritis    Colon polyps 09/08/2017   q 5 years; need records   Complication of anesthesia    Heart murmur    History of hepatitis B    Hyperlipemia    Hypertension    Hypothyroidism    Lesion of vocal cord 02/06/2017   Right granuloma; resolved on f/u   Melanoma (HCC)    Papillary thyroid  carcinoma (HCC)    s/p thyroidectomy,kidney,melanomas(2)   PONV (postoperative nausea and vomiting)    Urine incontinence    Past Surgical History:  Procedure Laterality Date   BLADDER SURGERY  2016   BLALOCK PROCEDURE  1954   BRONCHIAL BIOPSY  04/17/2023   Procedure: BRONCHIAL BIOPSIES;  Surgeon: Shelah Lamar RAMAN, MD;  Location: [facility] ENDOSCOPY;  Service: Pulmonary;;   BRONCHIAL BRUSHINGS  04/17/2023   Procedure: BRONCHIAL BRUSHINGS;  Surgeon: Shelah Lamar RAMAN, MD;  Location: [facility] ENDOSCOPY;  Service: Pulmonary;;   BRONCHIAL NEEDLE ASPIRATION BIOPSY  04/17/2023   Procedure: BRONCHIAL NEEDLE ASPIRATION BIOPSIES;  Surgeon: Shelah Lamar RAMAN, MD;  Location: [facility] ENDOSCOPY;  Service: Pulmonary;;   COLONOSCOPY     multiple - hx adenomas 2010 and 2015   CYSTOSCOPY N/A 06/26/2020   Procedure: FLEXIBLE CYSTOSCOPY FOR BLADDER STONE AND PLACEMENT OF FOLEY;  Surgeon: Selma Donnice SAUNDERS, MD;  Location: [facility];  Service: Urology;  Laterality: N/A;   MELANOMA EXCISION     Pottts shunt  1957   PULMONARY VALVE REPLACEMENT  10/21/2016   pig valve   ROBOTIC ASSITED PARTIAL NEPHRECTOMY Right 06/26/2020   Procedure: XI ROBOTIC  ASSITED LAPAROSCOPIC RADICAL NEPHRECTOMY;  Surgeon: Selma Donnice SAUNDERS, MD;  Location: [facility];  Service: Urology;  Laterality: Right;   TETRALOGY OF FALLOT REPAIR  1961   TOTAL KNEE ARTHROPLASTY Left 2016   TOTAL THYROIDECTOMY  2002   VIDEO BRONCHOSCOPY WITH ENDOBRONCHIAL ULTRASOUND Bilateral 04/17/2023   Procedure: VIDEO BRONCHOSCOPY WITH ENDOBRONCHIAL ULTRASOUND;  Surgeon: Shelah Lamar RAMAN, MD;  Location: [facility] ENDOSCOPY;  Service: Pulmonary;  Laterality: Bilateral;   Patient Active Problem List   Diagnosis Date Noted   Pulmonary nodules 04/14/2023   Shortness of breath 04/14/2023   Cancer of kidney (HCC) 11/17/2020   Renal mass 06/26/2020   Erectile dysfunction 10/30/2017   History of thyroid  cancer 09/08/2017   Mixed hyperlipidemia 09/08/2017   Essential hypertension 09/08/2017   Colon polyps 09/08/2017   Vocal cord dysfunction 03/29/2017   History of Blalock-Taussig shunt 12/15/2015   Tetralogy of Fallot s/p repair 12/15/2015   Hypogonadism in male 08/12/2015   Postoperative hypothyroidism 08/12/2015      REFERRING PROVIDER: Leita Blind, MD  REFERRING DIAG:  Diagnosis  R60.0 (ICD-10-CM) - Bilateral leg edema    THERAPY DIAG:  Malignant neoplasm of right kidney [facility])  Aftercare following surgery for neoplasm  Pulmonary nodules  Lymphedema, not elsewhere classified  ONSET DATE: 2022  Rationale for  Evaluation and Treatment: Rehabilitation  SUBJECTIVE:                                                                                                                                                                                           SUBJECTIVE STATEMENT:  I decided to go with the capris because I know I would not like the thigh high garments.   EVAL: It is swelling up to my thighs on both sides.  It started back in 2022 after I had my kidney cancer very mildly.  It started up bad right after my lung biopsy 04/17/23.  Denies SOB, orthopnea, has normal urine output.  Reports  legs look normal in the morning.   PERTINENT HISTORY: Has been evaluated by cardiology and given torsemide without improvement. Urine protein WNL, Deemed stable by cardiology and nephrology per pt and recent referring MD. Malignant carcinoid tumor of the bil bronchus and lung diagnosed 04/2023 - no treatment needed unless symptoms develop, Stage 1 renal cell carcinoma diagnosed in 2022 with Rt radical nephrectomy, CKD stage 3b - BUN/creatinine stable GFR of 40. 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  Hx of melanoma x 2, thyroid  cancer and multiple heart surgery.    PAIN:  Are you having pain? Mainly stiff in the legs.  OA in the hands  PRECAUTIONS: lymphedema bil  RED FLAGS: None   WEIGHT BEARING RESTRICTIONS: No  FALLS:  Has patient fallen in last 6 months? No  LIVING ENVIRONMENT: Lives with: lives with their family and lives with their spouse  OCCUPATION: retired   LEISURE: weights and walking  PRIOR LEVEL OF FUNCTION: Independent  PATIENT GOALS: What to do for the swelling    OBJECTIVE: Note: Objective measures were completed at Evaluation unless otherwise noted.  COGNITION: Overall cognitive status: Within functional limits for tasks assessed   PALPATION: +2 pitting Rt: foot to knee and distal medial thigh at hamstring, Lt: to knee   OBSERVATIONS / OTHER ASSESSMENTS: large melanoma removal from Lt leg above knee medially and Rt skin graft on thigh, Lt TKR, legs appear equal in size but swollen  LOWER EXTREMITY STRENGTH: LYMPHEDEMA ASSESSMENTS:   LOWER EXTREMITY LANDMARK RIGHT eval 06/06/23 06/14/23  At groin     30 cm proximal to suprapatella     20 cm proximal to suprapatella 51.3 53 51  10 cm proximal to suprapatella 43 44.7 43.5  At midpatella / popliteal crease 39 39.8 39  30 cm proximal to floor at lateral plantar foot 43.8  37  20 cm  proximal to floor at lateral  plantar foot 37.5  31.7  10 cm proximal to floor at lateral plantar foot 26.3  24  Circumference of ankle/heel 32.3    5 cm proximal to 1st MTP joint 22.4    Across MTP joint 22.1  22.1  Around proximal great toe 8.5  8.3  (Blank rows = not tested)  LOWER EXTREMITY LANDMARK LEFT eval 06/06/23 06/14/23  At groin     30 cm proximal to suprapatella     20 cm proximal to suprapatella 51.3 52.3 51  10 cm proximal to suprapatella 44.9 45.9 45  At midpatella / popliteal crease 41.6 39 37.5  30 cm proximal to floor at lateral plantar foot 42.7  36.1  20 cm proximal to floor at lateral plantar foot 35.6  30.2  10 cm proximal to floor at lateral plantar foot 26.5  22.9  Circumference of ankle/heel 33    5 cm proximal to 1st MTP joint 23.3  22.3  Across MTP joint 22.6  22.2  Around proximal great toe 8.4  8.1  (Blank rows = not tested)  GAIT: Distance walked: WNL from lobby to room                                                                                                                           TREATMENT DATE:  06/14/23 Pt has new compression capris from [employer] DSG brand size small He reports this has helped with the fluid moving into the thighs.  Remeasured both legs Discussed managing as he is or stepping up to a custom capri or tights at a higher compression level which would be measured here as needed but most likely without insurance coverage.    06/06/23 Pt arrives with new stockings which he likes and fit well but he noted that the fluid has moved into the thighs.   Remeasured thighs Helped pt choose thigh high stockings and sent link on abilico  Showed him capri options on [employer] and abilico as well for another possible option.    05/31/23 Self MLD edu per instruction section in seated with PT demonstrating each step and then pt performing.  Pt already does dry brushing so he has a good understanding already.  Able to reach all sections of his leg.  Just needing  vcs for pressure and using full hand vs fingertips.    05/25/23 Discussed garment options thigh high vs knee high.  Pt refers knee high Decided on medi plus size 5 regular, closed toe, black with pt given handout and link to [employer]ilico for self pay due to insurance type.  Also included link for jobst large calf donning device, self MLD for the bil Les video link, and how to donn stocking link Discussed vibration plate as pt has one and dry brushing which pt does, elevation and trying to take his shower at night so he can get his stockings on before they swell.  PATIENT EDUCATION:  Education details: per today's note  Person educated: Patient Education method: Programmer, Multimedia, Facilities Manager, Verbal cues, and Handouts Education comprehension: verbalized understanding, returned demonstration, and needs further education  HOME EXERCISE PROGRAM: Self MLD, dry brush  ASSESSMENT:  CLINICAL IMPRESSION: Pts legs are improved with the addition of compression capris from DSG brand.  They fit his as a full legging due to his height.  The legs have decreased at most 6cm in the lower legs and remain mostly stable in the proximal thighs but improved since wearing only knee high garments.  No abdominal or genital edema. No SOB.  Will continue with okay from doctors for pump.   OBJECTIVE IMPAIRMENTS: decreased activity tolerance, decreased knowledge of condition, decreased knowledge of use of DME, decreased ROM, and increased edema.   ACTIVITY LIMITATIONS: normal exercise levels  PARTICIPATION LIMITATIONS: none  PERSONAL FACTORS: Age, Past/current experiences, and 1-2 comorbidities: multiple health factors  are also affecting patient's functional outcome.   REHAB POTENTIAL: Good  CLINICAL DECISION MAKING: Evolving/moderate complexity  EVALUATION COMPLEXITY: Moderate   GOALS: Goals reviewed with patient? Yes  SHORT TERM GOALS+LTGs: Target date: 06/22/23  Pt will obtain compression for bil legs to  manage edema Baseline: Goal status: MET  2.  Pt will be ind with self MLD and knowledgeable about pump options Baseline:  Goal status: MET - gave pump info 05/25/23   PLAN:  PT FREQUENCY: 1x/week  PT DURATION: 4 weeks  PLANNED INTERVENTIONS: 97110-Therapeutic exercises, 97535- Self Care, [id number]- Manual therapy, Patient/Family education, and Manual lymph drainage  PLAN FOR NEXT SESSION: start to teach MLD for bil LE- pt has link from the CANCER PT via abilico so we can go from that.    Larue Saddie SAUNDERS, PT 06/14/2023, 11:07 AM

## 2023-06-21 ENCOUNTER — Encounter: Payer: Medicare Other | Admitting: Rehabilitation

## 2023-06-29 ENCOUNTER — Inpatient Hospital Stay: Payer: Medicare Other | Attending: Internal Medicine | Admitting: Genetic Counselor

## 2023-06-29 ENCOUNTER — Inpatient Hospital Stay: Payer: Medicare Other

## 2023-06-29 ENCOUNTER — Encounter: Payer: Self-pay | Admitting: Genetic Counselor

## 2023-06-29 DIAGNOSIS — Z85850 Personal history of malignant neoplasm of thyroid: Secondary | ICD-10-CM | POA: Diagnosis not present

## 2023-06-29 DIAGNOSIS — C7A1 Malignant poorly differentiated neuroendocrine tumors: Secondary | ICD-10-CM

## 2023-06-29 DIAGNOSIS — Z8051 Family history of malignant neoplasm of kidney: Secondary | ICD-10-CM | POA: Insufficient documentation

## 2023-06-29 DIAGNOSIS — C641 Malignant neoplasm of right kidney, except renal pelvis: Secondary | ICD-10-CM | POA: Insufficient documentation

## 2023-06-29 DIAGNOSIS — Z79899 Other long term (current) drug therapy: Secondary | ICD-10-CM | POA: Insufficient documentation

## 2023-06-29 DIAGNOSIS — Z85820 Personal history of malignant melanoma of skin: Secondary | ICD-10-CM | POA: Diagnosis not present

## 2023-06-29 DIAGNOSIS — Z808 Family history of malignant neoplasm of other organs or systems: Secondary | ICD-10-CM | POA: Diagnosis not present

## 2023-06-29 DIAGNOSIS — Z800 Family history of malignant neoplasm of digestive organs: Secondary | ICD-10-CM | POA: Insufficient documentation

## 2023-06-29 LAB — CBC WITH DIFFERENTIAL/PLATELET
Abs Immature Granulocytes: 0.02 10*3/uL (ref 0.00–0.07)
Basophils Absolute: 0.1 10*3/uL (ref 0.0–0.1)
Basophils Relative: 1 %
Eosinophils Absolute: 0.6 10*3/uL — ABNORMAL HIGH (ref 0.0–0.5)
Eosinophils Relative: 8 %
HCT: 38.5 % — ABNORMAL LOW (ref 39.0–52.0)
Hemoglobin: 12.2 g/dL — ABNORMAL LOW (ref 13.0–17.0)
Immature Granulocytes: 0 %
Lymphocytes Relative: 30 %
Lymphs Abs: 2.3 10*3/uL (ref 0.7–4.0)
MCH: 28.2 pg (ref 26.0–34.0)
MCHC: 31.7 g/dL (ref 30.0–36.0)
MCV: 88.9 fL (ref 80.0–100.0)
Monocytes Absolute: 0.9 10*3/uL (ref 0.1–1.0)
Monocytes Relative: 12 %
Neutro Abs: 3.7 10*3/uL (ref 1.7–7.7)
Neutrophils Relative %: 49 %
Platelets: 231 10*3/uL (ref 150–400)
RBC: 4.33 MIL/uL (ref 4.22–5.81)
RDW: 14.1 % (ref 11.5–15.5)
WBC: 7.5 10*3/uL (ref 4.0–10.5)
nRBC: 0 % (ref 0.0–0.2)

## 2023-06-29 LAB — GENETIC SCREENING ORDER

## 2023-06-29 NOTE — Progress Notes (Signed)
REFERRING PROVIDER: Si Gaul, MD [address] [place],  [place] [address]  PRIMARY PROVIDER:  Melida Quitter, MD  PRIMARY REASON FOR VISIT:  Encounter Diagnoses  Name Primary?   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 Delucia, a 78 y.o. male, was seen for a [facility] cancer genetics consultation at the request of Dr. Arbutus Ped due to a personal and family history of several cancers.  Mr. Gilson presents to clinic today to discuss the possibility of a hereditary predisposition to cancer, to discuss genetic testing, and to further clarify his future cancer risks, as well as potential cancer risks for family members.   In 2022, Mr. Lebarron was diagnosed with stage I clear cell renal cell carcinoma s/p right nephrectomy.  In December 2024, bilateral pulmonary nodules were detected which are consistent with a carcinoid tumor.   He has a history of two melanomas s/p resection in 1990 and 1994.  He also has a history of papillary thyroid cancer diagnosed in 2002 s/p thyroidectomy and radiation therapy.    CANCER HISTORY:  Oncology History  Cancer of kidney (HCC)  11/17/2020 Initial Diagnosis   Cancer of kidney (HCC)   11/17/2020 Cancer Staging   Staging form: Kidney, AJCC 8th Edition - Clinical: Stage I (cT1, cN0, cM0) - Signed by Benjiman Core, MD on 11/17/2020      SCREENING/RISK FACTORS:  Colonoscopy: yes;  history of one colon polyp per patient .    Past Medical History:  Diagnosis Date   Arthritis    Colon polyps 09/08/2017   q 5 years; need records   Complication of anesthesia    Heart murmur    History of hepatitis B    Hyperlipemia    Hypertension    Hypothyroidism    Lesion of vocal cord  02/06/2017   Right granuloma; resolved on f/u   Melanoma (HCC)    Papillary thyroid carcinoma (HCC)    s/p thyroidectomy,kidney,melanomas(2)   PONV (postoperative nausea and vomiting)    Urine incontinence     Past Surgical History:  Procedure Laterality Date   BLADDER SURGERY  2016   BLALOCK PROCEDURE  1954   BRONCHIAL BIOPSY  04/17/2023   Procedure: BRONCHIAL BIOPSIES;  Surgeon: [name], MD;  Location: [facility] ENDOSCOPY;  Service: Pulmonary;;   BRONCHIAL BRUSHINGS  04/17/2023   Procedure: BRONCHIAL BRUSHINGS;  Surgeon: [name], MD;  Location: [facility] ENDOSCOPY;  Service: Pulmonary;;   BRONCHIAL NEEDLE ASPIRATION BIOPSY  04/17/2023   Procedure: BRONCHIAL NEEDLE ASPIRATION BIOPSIES;  Surgeon: [name], MD;  Location: [facility] ENDOSCOPY;  Service: Pulmonary;;   COLONOSCOPY     multiple - hx adenomas 2010 and 2015   CYSTOSCOPY N/A 06/26/2020   Procedure: FLEXIBLE CYSTOSCOPY FOR BLADDER STONE AND PLACEMENT OF FOLEY;  Surgeon: Jannifer Hick, MD;  Location: [facility] ORS;  Service: Urology;  Laterality: N/A;   MELANOMA EXCISION     Pottts shunt  1957   PULMONARY VALVE REPLACEMENT  10/21/2016   pig valve   ROBOTIC ASSITED PARTIAL NEPHRECTOMY Right 06/26/2020   Procedure: XI ROBOTIC ASSITED LAPAROSCOPIC RADICAL NEPHRECTOMY;  Surgeon: Jannifer Hick, MD;  Location: [facility] ORS;  Service: Urology;  Laterality: Right;  TETRALOGY OF FALLOT REPAIR  1961   TOTAL KNEE ARTHROPLASTY Left 2016   TOTAL THYROIDECTOMY  2002   VIDEO BRONCHOSCOPY WITH ENDOBRONCHIAL ULTRASOUND Bilateral 04/17/2023   Procedure: VIDEO BRONCHOSCOPY WITH ENDOBRONCHIAL ULTRASOUND;  Surgeon: [name], MD;  Location: [facility] ENDOSCOPY;  Service: Pulmonary;  Laterality: Bilateral;    FAMILY HISTORY:  We obtained a detailed, 4-generation family history.  Significant diagnoses are listed below: Family History  Problem Relation Age of Onset   Stomach cancer Mother 36   Pancreatic cancer Father 39   Renal cancer Brother    Throat  cancer Paternal Grandmother        d. late 19s-early 49s     Mr. Emmick is unaware of previous family history of genetic testing for hereditary cancer risks.  He did not report any known personal or family history of hyperparathyroidism.   There is no reported Ashkenazi Jewish ancestry. There is no known consanguinity.  GENETIC COUNSELING ASSESSMENT: Mr. Bring is a 78 y.o. male with a personal and family history of cancer which is somewhat suggestive of a hereditary cancers syndrome and predisposition to cancer given the presence of related cancers in the family (pancreatic cancer and melanoma as well as renal cell carcinoma). We, therefore, discussed and recommended the following at today's visit.   DISCUSSION: We discussed that 5 - 10% of cancer is hereditary.  Hereditary pancreatic cancer and melanoma can be associated with mutations in genes including but not limited to BRCA2 and CDKN2A.  Hereditary renal cell carcinoma can be associated with mutations in genes including but not limited to MET and FLCN.  We discussed that testing is beneficial for several reasons including knowing how to follow individuals for their cancer risks and understanding if other family members could be at an increased risk for cancer and allowing them to undergo genetic testing.   We reviewed the characteristics, features and inheritance patterns of hereditary cancer syndromes. We also discussed genetic testing, including the appropriate family members to test, the process of testing, insurance coverage and turn-around-time for results. We discussed the implications of a negative, positive, carrier and/or variant of uncertain significant result. We recommended Mr. Babington pursue genetic testing for a panel that includes genes associated with melanoma, pancreatic cancer, renal cell carcinoma, neuroendocrine tumors, and other cancers.   The CancerNext-Expanded gene panel offered by [facility] and includes  sequencing, rearrangement, and RNA analysis for the following 76 genes: AIP, ALK, APC, ATM, AXIN2, BAP1, BARD1, BMPR1A, BRCA1, BRCA2, BRIP1, CDC73, CDH1, CDK4, CDKN1B, CDKN2A, CEBPA, CHEK2, CTNNA1, DDX41, DICER1, ETV6, FH, FLCN, GATA2, LZTR1, MAX, MBD4, MEN1, MET, MLH1, MSH2, MSH3, MSH6, MUTYH, NF1, NF2, NTHL1, PALB2, PHOX2B, PMS2, POT1, PRKAR1A, PTCH1, PTEN, RAD51C, RAD51D, RB1, RET, RUNX1, SDHA, SDHAF2, SDHB, SDHC, SDHD, SMAD4, SMARCA4, SMARCB1, SMARCE1, STK11, SUFU, TMEM127, TP53, TSC1, TSC2, VHL, and WT1 (sequencing and deletion/duplication); EGFR, HOXB13, KIT, MITF, PDGFRA, POLD1, and POLE (sequencing only); EPCAM and GREM1 (deletion/duplication only).   Based on Mr. Radilla's personal history of melanoma and family history of pancreatic cancer in his father (as well as personal and family history of RCC), he meets NCCN medical criteria for genetic testing.  He is the most informative relative available for testing. Despite that he meets criteria, he may still have an out of pocket cost. We discussed that if his out of pocket cost for testing is over $100, the laboratory should contact him and discuss the self-pay prices and/or patient pay assistance programs.    PLAN: After considering the risks,  benefits, and limitations, Mr. Sanjuan provided informed consent to pursue genetic testing and the blood sample was sent to [facility] for analysis of the CancerNext-Expanded +RNAinsight Panel. Results should be available within approximately 3 weeks, at which point they will be disclosed by telephone to Mr. Charity, as will any additional recommendations warranted by these results. Mr. Sangiovanni will receive a summary of his genetic counseling visit and a copy of his results once available. This information will also be available in Epic.   Mr. Riel questions were answered to his satisfaction today. Our contact information was provided should additional questions or concerns arise. Thank you for the  referral and allowing [place] to share in the care of your patient.   Iyahna Obriant M. Rennie Plowman, MS, [facility] Genetic Counselor [email or web address] (P) [phone]   40 minutes were spent on the date of the encounter in service to the patient including preparation, face-to-face consultation, documentation and care coordination.  The patient was seen alone.  Drs. Gunnar Bulla and/or Mosetta Putt were available to discuss this case as needed.    _______________________________________________________________________ For Office Staff:  Number of people involved in session: 1 Was an Intern/ student involved with case: no

## 2023-07-21 ENCOUNTER — Encounter: Payer: Self-pay | Admitting: Genetic Counselor

## 2023-07-21 ENCOUNTER — Ambulatory Visit: Payer: Self-pay | Admitting: Genetic Counselor

## 2023-07-21 DIAGNOSIS — Z1589 Genetic susceptibility to other disease: Secondary | ICD-10-CM

## 2023-07-21 DIAGNOSIS — C7A1 Malignant poorly differentiated neuroendocrine tumors: Secondary | ICD-10-CM

## 2023-07-21 DIAGNOSIS — C641 Malignant neoplasm of right kidney, except renal pelvis: Secondary | ICD-10-CM

## 2023-07-21 DIAGNOSIS — Z85850 Personal history of malignant neoplasm of thyroid: Secondary | ICD-10-CM

## 2023-07-21 DIAGNOSIS — Z800 Family history of malignant neoplasm of digestive organs: Secondary | ICD-10-CM

## 2023-07-21 DIAGNOSIS — Z8051 Family history of malignant neoplasm of kidney: Secondary | ICD-10-CM

## 2023-07-21 DIAGNOSIS — Z85820 Personal history of malignant melanoma of skin: Secondary | ICD-10-CM

## 2023-07-21 DIAGNOSIS — Z1379 Encounter for other screening for genetic and chromosomal anomalies: Secondary | ICD-10-CM

## 2023-07-21 NOTE — Progress Notes (Signed)
 GENETIC TEST RESULTS  Patient Name: Christopher Mosley Patient Age: 78 y.o. Encounter Date: 07/21/2023  Referring Provider: Si Gaul, MD  Christopher Mosley was seen in the Cancer Genetics clinic on June 29, 2023 due to a personal and family history of cancer and concern regarding a hereditary predisposition to cancer in the family. Please refer to the prior Genetics clinic note for more information regarding Christopher Mosley's medical and family histories and our assessment at the time.   Oncology History:  In 2022, [name] was diagnosed with stage I clear cell renal cell carcinoma s/p right nephrectomy.  In December 2024, bilateral pulmonary nodules were detected which are consistent with a carcinoid tumor.    He has a history of two melanomas s/p resection in 1990 and 1994.  He also has a history of papillary thyroid cancer diagnosed in 2002 s/p thyroidectomy and radiation therapy.   FAMILY HISTORY:  We obtained a detailed, 4-generation family history.  Significant diagnoses are listed below:      Family History  Problem Relation Age of Onset   Stomach cancer Mother 98   Pancreatic cancer Father 39   Renal cancer Brother     Throat cancer Paternal Grandmother          d. late 69s-early 4s       Christopher Mosley is unaware of previous family history of genetic testing for hereditary cancer risks.  He did not report any known personal or family history of hyperparathyroidism.    There is no reported Ashkenazi Jewish ancestry. There is no known consanguinity.  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 Specifically, this variant is [id number].    No other pathogenic variants were detected in the [facility] CancerNext-Expanded +RNAinsight Panel.    The CancerNext-Expanded gene panel offered by [facility] and includes sequencing, rearrangement, and RNA analysis for the following 76 genes: AIP, ALK, APC, ATM,  AXIN2, BAP1, BARD1, BMPR1A, BRCA1, BRCA2, BRIP1, CDC73, CDH1, CDK4, CDKN1B, CDKN2A, CEBPA, CHEK2, CTNNA1, DDX41, DICER1, ETV6, FH, FLCN, GATA2, LZTR1, MAX, MBD4, MEN1, MET, MLH1, MSH2, MSH3, MSH6, MUTYH, NF1, NF2, NTHL1, PALB2, PHOX2B, PMS2, POT1, PRKAR1A, PTCH1, PTEN, RAD51C, RAD51D, RB1, RET, RUNX1, SDHA, SDHAF2, SDHB, SDHC, SDHD, SMAD4, SMARCA4, SMARCB1, SMARCE1, STK11, SUFU, TMEM127, TP53, TSC1, TSC2, VHL, and WT1 (sequencing and deletion/duplication); EGFR, HOXB13, KIT, MITF, PDGFRA, POLD1, and POLE (sequencing only); EPCAM and GREM1 (deletion/duplication only).    The test report has been scanned into EPIC and is located under the Molecular Pathology section of the Results Review tab.  A portion of the result report is included below for reference. Genetic testing reported out on July 18, 2023.     Of note, this result does not explain the personal or family history of cancer.  Possible explanations for the cancer in the family may include: There may be no hereditary risk for cancer in the family. The cancers in Christopher Mosley and/or his family may be sporadic/familial or due to other genetic and environmental factors.  Most cancer is not hereditary.  There may be a gene mutation in one of these genes that current testing methods cannot detect but that chance is small. There could be another gene that has not yet been discovered, or that we have not yet tested, that is responsible for the cancer diagnoses in the family.  It is also possible there is a hereditary cause for the cancer in the family that Christopher Mosley did not  inherit.  Therefore, it is important to remain in touch with cancer genetics in the future so that we can continue to offer Christopher Mosley the most up to date genetic testing.  Relatives should inform their providers of the family history of cancer.   Cancer Risks for CHEK2: Females have a 23-27% lifetime risk of breast cancer. For females with a history of breast cancer: 10 year  cumulative risk for contralateral breast cancer is 6-8%. Males are thought to be at an increased risk of prostate cancer. The exact risk figure is unknown at this time. No increased risk for colon cancer based on mutation alone.  Research is continuing to help learn more about the cancers associated with CHEK2 pathogenic variants and what the exact risks are to develop these cancers.  Of note, some smaller studies have suggested a possible increased risk for kidney cancer, papillary thyroid cancer, and other cancers for those with CHEK2 mutations; however, larger studies have not been able to firmly establish an associated with these cancers at this time.   Management Recommendations:  Breast Cancer Screening/Risk Reduction: Females: Breast cancer screening includes: Breast awareness beginning at age 77 Monthly self-breast examination beginning at age 29 Clinical breast examination every 6-12 months beginning at age 76 or at the age of the earliest diagnosed breast cancer in the family, if onset was before age 56 Annual mammogram with consideration of tomosynthesis starting at age 45 or 10 years prior to the youngest age of diagnosis, whichever comes first Consider breast MRI with contrast starting at age 75-35 Evidence is insufficient for a prophylactic risk-reducing mastectomy, manage based on family history   Colon Cancer Screening: General population screening is appropriate Manage based on personal and family history   Prostate Cancer Screening: Consider prostate cancer screening beginning at age 92   This information is based on current understanding of the gene and may change in the future.   Implications for Family Members: Hereditary predisposition to cancer due to pathogenic variants in the CHEK2 gene has autosomal dominant inheritance. This means that an individual with a pathogenic variant has a 50% chance of passing the condition on to his/her offspring.  First degree  relatives have a 50% chance of having the same mutation in CHEK2.  More distant relatives also have an increased chance of having the same mutation in CHEK2.  Identification of a pathogenic variant allows for the recognition of at-risk relatives who can pursue testing for the familial variant.   Family members are recommended to consider genetic testing for this familial pathogenic variant. As there are generally no childhood cancer risks associated with single pathogenic variants in the CHEK2 gene, individuals in the family are not recommended to have testing until they reach at least 76 years of age. Complimentary testing for the familial variant is available for 90 days. They may contact our office at [phone] for more information or to schedule an appointment. Family members who live outside of the area are encouraged to find a genetic counselor in their area by visiting: [email or web address].   Resources: FORCE (Facing Our Risk of Cancer Empowered) is a resource for those with a hereditary predisposition to develop cancer.  FORCE provides information about risk reduction, advocacy, legislation, and clinical trials.  Additionally, FORCE provides a platform for collaboration and support; which includes: peer navigation, message boards, local support groups, a toll-free helpline, research registry and recruitment, advocate training, published medical research, webinars, brochures, mastectomy photos, and more.  For more information, visit www.facingourrisk.org  Plan:  Christopher Mosley should continue to remain diligent about annual PSA screening.   Family letter provided to encourage family testing.   Our contact number was provided. Christopher Mosley questions were answered to his satisfaction, and he knows he is welcome to call us at anytime with additional questions or concerns.   Hady Niemczyk M. Rennie Plowman, MS, [facility] Certified Education officer, [email or web address] (P)  [phone]

## 2023-08-01 DIAGNOSIS — C7A8 Other malignant neuroendocrine tumors: Secondary | ICD-10-CM | POA: Insufficient documentation

## 2023-09-26 ENCOUNTER — Ambulatory Visit (INDEPENDENT_AMBULATORY_CARE_PROVIDER_SITE_OTHER): Payer: Medicare Other | Admitting: Physician Assistant

## 2023-09-26 ENCOUNTER — Encounter: Payer: Self-pay | Admitting: Physician Assistant

## 2023-09-26 VITALS — BP 181/97 | HR 63 | Temp 98.1°F | Ht 64.5 in | Wt 146.5 lb

## 2023-09-26 DIAGNOSIS — E782 Mixed hyperlipidemia: Secondary | ICD-10-CM

## 2023-09-26 DIAGNOSIS — C7A8 Other malignant neuroendocrine tumors: Secondary | ICD-10-CM

## 2023-09-26 DIAGNOSIS — I10 Essential (primary) hypertension: Secondary | ICD-10-CM

## 2023-09-26 DIAGNOSIS — J392 Other diseases of pharynx: Secondary | ICD-10-CM | POA: Diagnosis not present

## 2023-09-26 NOTE — Patient Instructions (Signed)
 It was great to see you!  Please let us  know what your cardiologist says!  If still not sure, please schedule a visit with Dr Valdene Garret here for him to help us  figure out next steps!  Take care,  Alexander Iba PA-C

## 2023-09-26 NOTE — Progress Notes (Signed)
 Christopher Mosley is a 78 y.o. male here to establish care.  History of Present Illness:   Chief Complaint  Patient presents with   Establish Care   Breathing Problem    Pt is c/o rattling when he is breathing since Dec when he had his biopsy done.    HPI  Relevant medical history: - Currently PCP is Dr. Charolet Cope, but states he will likely switch to [facility] with Tetralogy fallot Contracted Polio before initial surgery, had to spend 4 months in iron lung Age 57 years -- heart surgery -- Blalock-Taussig shunt Age 52 years -- heart surgery -- Potts shunt Age 89 -- heart surgery -- correction of tetraology fallot Hepatitis B from blood transfusion - hospitalized 1990 -- Melanoma of left knee 1994 -- melanoma of right chest  2001 -- papillary thyroid  cancer-- 15 of 17 lymphnodes with evidence of metastatic disease; total thyroidectomy 2016 -- left knee replacement 2017 -- bladder surgery 2018 -- pulmonary valve replacement 2022 -- right kidney removed due to renal cell carcinoma 2024 -- diagnosed with neuroendocrine lung cancer  Acute concerns: Rattling noise when breathing Pt complains of a rattling noise when breathing starting 12/24. He reports hearing a noise while he breathes which sounds like gurgling and originating in his throat. While lying flat at night, he reports he feels like there is something at the top of throat. He reports he did not hear any noise while breathing when he visited his family in [place], so is unsure if the cause is environmental. Pt has not seen an allergist, but has seen ENT and pulmonology who have not found any problems so far. He has a echocardiogram scheduled for tomorrow with his cardiologist at [facility]. Denies coughing, mucus drainage, or acid reflux.  He notes a case of BLE lymphedema after a bronchoscopy and experiences minor swelling bilaterally while sitting. He reports having an ultrasound done on his legs 1 year ago to  ensure there were no clots or blockages. He states he tried an antiinflammatory diet, which has helped swelling.  HTN Currently taking amlodipine  5 mg daily and hydralazine 50 mg BID  He was prescribed torsemide 5 mg daily however it is unclear to me why he is no longer taking this. At home blood pressure readings are: 132/82 this morning. Patient denies chest pain, SOB, blurred vision, dizziness, unusual headaches. He has pitting edema in lower legs b/l.  Patient is compliant with medication. Denies excessive caffeine intake, stimulant usage, excessive alcohol intake, or increase in salt consumption.  BP Readings from Last 3 Encounters:  09/26/23 (!) 181/97  04/27/23 (!) 147/85  04/25/23 (!) 162/70     Chronic issues: HLD Pt was previously on Atorvastatin, but stopped taking it due to body cramps He reports the facial cramps have ceased ever since stopping Atorvastatin. He has not taking Atorvastatin for several weeks now. No concerns reported today.  Past Medical History:  Diagnosis Date   Allergy    Arthritis    Chronic kidney disease 03/2020   Colon polyps 09/08/2017   q 5 years; need records   Complication of anesthesia    Heart murmur    History of hepatitis B    Hyperlipemia    Hypertension    Hypothyroidism    Lesion of vocal cord 02/06/2017   Right granuloma; resolved on f/u   Melanoma (HCC)    Papillary thyroid  carcinoma (HCC)    s/p thyroidectomy,kidney,melanomas(2)   PONV (postoperative nausea and vomiting)  Urine incontinence      Social History   Tobacco Use   Smoking status: Never   Smokeless tobacco: Never  Vaping Use   Vaping status: Never Used  Substance Use Topics   Alcohol use: Yes    Comment: occas.   Drug use: Never    Past Surgical History:  Procedure Laterality Date   BLADDER SURGERY  2016   BLALOCK PROCEDURE  1954   BRONCHIAL BIOPSY  04/17/2023   Procedure: BRONCHIAL BIOPSIES;  Surgeon: Denson Flake, MD;  Location: [facility]  ENDOSCOPY;  Service: Pulmonary;;   BRONCHIAL BRUSHINGS  04/17/2023   Procedure: BRONCHIAL BRUSHINGS;  Surgeon: Denson Flake, MD;  Location: [facility] ENDOSCOPY;  Service: Pulmonary;;   BRONCHIAL NEEDLE ASPIRATION BIOPSY  04/17/2023   Procedure: BRONCHIAL NEEDLE ASPIRATION BIOPSIES;  Surgeon: Denson Flake, MD;  Location: [facility] ENDOSCOPY;  Service: Pulmonary;;   CARDIAC VALVE REPLACEMENT  10/2016   COLONOSCOPY     multiple - hx adenomas 2010 and 2015   CYSTOSCOPY N/A 06/26/2020   Procedure: FLEXIBLE CYSTOSCOPY FOR BLADDER STONE AND PLACEMENT OF FOLEY;  Surgeon: Lahoma Pigg, MD;  Location: [facility] ORS;  Service: Urology;  Laterality: N/A;   JOINT REPLACEMENT  01/2015   Total left knee   MELANOMA EXCISION Left 1990   knee   MELANOMA EXCISION Right 1994   chest   Pottts shunt  1957   PULMONARY VALVE REPLACEMENT  10/21/2016   pig valve   ROBOTIC ASSITED PARTIAL NEPHRECTOMY Right 06/26/2020   Procedure: XI ROBOTIC ASSITED LAPAROSCOPIC RADICAL NEPHRECTOMY;  Surgeon: Lahoma Pigg, MD;  Location: [facility] ORS;  Service: Urology;  Laterality: Right;   TETRALOGY OF FALLOT REPAIR  1961   TOTAL KNEE ARTHROPLASTY Left 2016   TOTAL THYROIDECTOMY  2002   VIDEO BRONCHOSCOPY WITH ENDOBRONCHIAL ULTRASOUND Bilateral 04/17/2023   Procedure: VIDEO BRONCHOSCOPY WITH ENDOBRONCHIAL ULTRASOUND;  Surgeon: Denson Flake, MD;  Location: [facility] ENDOSCOPY;  Service: Pulmonary;  Laterality: Bilateral;    Family History  Problem Relation Age of Onset   Alcohol abuse Mother    Diabetes Mother    Depression Mother    Early death Mother    Hyperlipidemia Mother    Miscarriages / Stillbirths Mother    Hypertension Mother    Stomach cancer Mother 9   Alcohol abuse Father    Diabetes Father    Early death Father    Heart attack Father    Heart disease Father    Hypertension Father    Pancreatic cancer Father 65   Arthritis Brother    Diabetes Brother    Stroke Brother    Renal cancer Brother    Asthma Brother    Drug  abuse Brother    Early death Maternal Grandmother    Heart attack Maternal Grandmother    Alcohol abuse Maternal Grandfather    Heart attack Maternal Grandfather    Throat cancer Paternal Grandmother        d. late 60s-early 47s   Cancer Paternal Grandfather    Healthy Daughter    Healthy Son     Allergies  Allergen Reactions   Codeine Other (See Comments)    Syncope    Sulfa Antibiotics Diarrhea and Rash    Current Medications:   Current Outpatient Medications:    amLODipine  (NORVASC ) 5 MG tablet, Take 5 mg by mouth daily., Disp: , Rfl:    aspirin (ASPIRIN 81) 81 MG chewable tablet, , Disp: , Rfl:    hydrALAZINE (APRESOLINE) 50 MG  tablet, Take 50 mg by mouth 2 (two) times daily., Disp: , Rfl:    levothyroxine  (SYNTHROID ) 75 MCG tablet, TAKE 1 TABLET BY MOUTH ONCE DAILY BEFORE BREAKFAST (Patient taking differently: Take 75 mcg by mouth daily before breakfast.), Disp: 90 tablet, Rfl: 3   triamterene-hydrochlorothiazide (DYAZIDE) 37.5-25 MG capsule, Take 0.5 capsules by mouth daily., Disp: , Rfl:    Review of Systems:   Negative unless otherwise specified per HPI.  Vitals:   Vitals:   09/26/23 0946 09/26/23 1030  BP: (!) 177/102 (!) 181/97  Pulse: 63   Temp: 98.1 F (36.7 C)   TempSrc: Temporal   SpO2: 99%   Weight: 146 lb 8 oz (66.5 kg)   Height: 5' 4.5" (1.638 m)      Body mass index is 24.76 kg/m.  Physical Exam:   Physical Exam Vitals and nursing note reviewed.  Constitutional:      General: He is not in acute distress.    Appearance: He is well-developed. He is not ill-appearing or toxic-appearing.  Cardiovascular:     Rate and Rhythm: Normal rate and regular rhythm.     Pulses: Normal pulses.     Heart sounds: Murmur heard.     Systolic murmur is present.  Pulmonary:     Effort: Pulmonary effort is normal.     Breath sounds: Normal breath sounds.  Musculoskeletal:     Right lower leg: 2+ Pitting Edema present.     Left lower leg: 2+ Pitting Edema  present.  Skin:    General: Skin is warm and dry.  Neurological:     Mental Status: He is alert.     GCS: GCS eye subscore is 4. GCS verbal subscore is 5. GCS motor subscore is 6.  Psychiatric:        Speech: Speech normal.        Behavior: Behavior normal. Behavior is cooperative.     Assessment and Plan:   1. Neuroendocrine cancer (HCC) (Primary) Currently in observation status Seeing Dr Marguerita Shih with [facility] Most recent note reviewed  2. Throat disorder This is the main reason he is here for "another opinion on this" He saw ENT and his cardiologist since this started and they rule out any concerning throat disorder or fluid overload per their documentation He does not have any symptom(s) of this today for us  to hear unfortunately Thankfully has appointment with cardiology tomorrow for repeat echocardiogram -- he has pitting edema in b/l legs today and it is unclear why he is no longer taking torsemide -- will defer to cardiology appointment tomorrow He may be having allergy symptom(s) -- could consider starting claritin or zyrtec with permission from cardiology He denies concerns for reflux, however question if possible "silent reflux" causing symptom(s) Discussed all these possibilities -- he is agreeable to seeing cardiology tomorrow and starting there May need to consider returning to pulmonary as this started after this bronchoscopy  3. Mixed hyperlipidemia Currently off his statin due to cramps Defer management to cardiology   4. Essential hypertension Above goal today No evidence of end-organ damage on my exam Recommend patient monitor home blood pressure at least a few times weekly Continue amlodipine  5 mg daily and hydralazine 50 mg BID  He was prescribed torsemide 5 mg daily however it is unclear to me why he is no longer taking this -- defer to cardiology If home monitoring shows consistent elevation, or any symptom(s) develop, recommend reach out  to us  for further  advice on next steps   I, Timoteo Force, acting as a Neurosurgeon for [employer], [place]., have documented all relevant documentation on the behalf of Alexander Iba, [place], as directed by  Alexander Iba, PA while in the presence of Alexander Iba, [place].  I, Alexander Iba, [place], have reviewed all documentation for this visit. The documentation on 09/26/23 for the exam, diagnosis, procedures, and orders are all accurate and complete.  I spent a total of 45 minutes on this visit, today 09/26/23, which included reviewing previous notes from cardiology, ENT, discussing plan of care with patient and using shared-decision making on next steps, and documenting the findings in the note.   Alexander Iba, PA-C

## 2023-10-24 ENCOUNTER — Encounter: Payer: Self-pay | Admitting: Internal Medicine

## 2023-10-26 ENCOUNTER — Other Ambulatory Visit: Payer: Self-pay | Admitting: Internal Medicine

## 2023-10-26 ENCOUNTER — Other Ambulatory Visit: Payer: Self-pay

## 2023-10-26 DIAGNOSIS — C7A1 Malignant poorly differentiated neuroendocrine tumors: Secondary | ICD-10-CM

## 2023-10-26 DIAGNOSIS — C641 Malignant neoplasm of right kidney, except renal pelvis: Secondary | ICD-10-CM

## 2023-11-13 ENCOUNTER — Encounter (HOSPITAL_COMMUNITY)
Admission: RE | Admit: 2023-11-13 | Discharge: 2023-11-13 | Disposition: A | Discharge status: Home / Self Care | Source: Ambulatory Visit | Attending: Internal Medicine | Admitting: Internal Medicine

## 2023-11-13 DIAGNOSIS — C7A1 Malignant poorly differentiated neuroendocrine tumors: Secondary | ICD-10-CM | POA: Insufficient documentation

## 2023-11-13 MED ORDER — COPPER CU 64 DOTATATE 1 MCI/ML IV SOLN
4.0000 | Freq: Once | INTRAVENOUS | Status: AC
  Administered 2023-11-13: 4.311 via INTRAVENOUS

## 2023-11-21 ENCOUNTER — Inpatient Hospital Stay

## 2023-11-21 ENCOUNTER — Inpatient Hospital Stay: Attending: Internal Medicine | Admitting: Internal Medicine

## 2023-11-21 VITALS — BP 168/79 | HR 67 | Temp 97.3°F | Resp 17 | Ht 64.5 in | Wt 153.0 lb

## 2023-11-21 DIAGNOSIS — Z85820 Personal history of malignant melanoma of skin: Secondary | ICD-10-CM | POA: Diagnosis not present

## 2023-11-21 DIAGNOSIS — R918 Other nonspecific abnormal finding of lung field: Secondary | ICD-10-CM | POA: Diagnosis not present

## 2023-11-21 DIAGNOSIS — C641 Malignant neoplasm of right kidney, except renal pelvis: Secondary | ICD-10-CM | POA: Insufficient documentation

## 2023-11-21 DIAGNOSIS — E785 Hyperlipidemia, unspecified: Secondary | ICD-10-CM | POA: Insufficient documentation

## 2023-11-21 DIAGNOSIS — Z860101 Personal history of adenomatous and serrated colon polyps: Secondary | ICD-10-CM | POA: Diagnosis not present

## 2023-11-21 DIAGNOSIS — Z885 Allergy status to narcotic agent status: Secondary | ICD-10-CM | POA: Diagnosis not present

## 2023-11-21 DIAGNOSIS — Z85850 Personal history of malignant neoplasm of thyroid: Secondary | ICD-10-CM | POA: Insufficient documentation

## 2023-11-21 DIAGNOSIS — C7A8 Other malignant neuroendocrine tumors: Secondary | ICD-10-CM

## 2023-11-21 DIAGNOSIS — Z79899 Other long term (current) drug therapy: Secondary | ICD-10-CM | POA: Insufficient documentation

## 2023-11-21 DIAGNOSIS — Z882 Allergy status to sulfonamides status: Secondary | ICD-10-CM | POA: Diagnosis not present

## 2023-11-21 DIAGNOSIS — I10 Essential (primary) hypertension: Secondary | ICD-10-CM | POA: Diagnosis not present

## 2023-11-21 DIAGNOSIS — Z905 Acquired absence of kidney: Secondary | ICD-10-CM | POA: Insufficient documentation

## 2023-11-21 LAB — CBC WITH DIFFERENTIAL (CANCER CENTER ONLY)
Abs Immature Granulocytes: 0.02 K/uL (ref 0.00–0.07)
Basophils Absolute: 0.1 K/uL (ref 0.0–0.1)
Basophils Relative: 1 %
Eosinophils Absolute: 0.3 K/uL (ref 0.0–0.5)
Eosinophils Relative: 4 %
HCT: 46 % (ref 39.0–52.0)
Hemoglobin: 15.1 g/dL (ref 13.0–17.0)
Immature Granulocytes: 0 %
Lymphocytes Relative: 39 %
Lymphs Abs: 2.8 K/uL (ref 0.7–4.0)
MCH: 28.3 pg (ref 26.0–34.0)
MCHC: 32.8 g/dL (ref 30.0–36.0)
MCV: 86.3 fL (ref 80.0–100.0)
Monocytes Absolute: 0.9 K/uL (ref 0.1–1.0)
Monocytes Relative: 12 %
Neutro Abs: 3.3 K/uL (ref 1.7–7.7)
Neutrophils Relative %: 44 %
Platelet Count: 250 K/uL (ref 150–400)
RBC: 5.33 MIL/uL (ref 4.22–5.81)
RDW: 14.9 % (ref 11.5–15.5)
WBC Count: 7.3 K/uL (ref 4.0–10.5)
nRBC: 0 % (ref 0.0–0.2)

## 2023-11-21 LAB — CMP (CANCER CENTER ONLY)
ALT: 14 U/L (ref 0–44)
AST: 20 U/L (ref 15–41)
Albumin: 4.7 g/dL (ref 3.5–5.0)
Alkaline Phosphatase: 54 U/L (ref 38–126)
Anion gap: 7 (ref 5–15)
BUN: 34 mg/dL — ABNORMAL HIGH (ref 8–23)
CO2: 32 mmol/L (ref 22–32)
Calcium: 9.8 mg/dL (ref 8.9–10.3)
Chloride: 100 mmol/L (ref 98–111)
Creatinine: 1.74 mg/dL — ABNORMAL HIGH (ref 0.61–1.24)
GFR, Estimated: 40 mL/min — ABNORMAL LOW (ref >60)
Glucose, Bld: 110 mg/dL — ABNORMAL HIGH (ref 70–99)
Potassium: 4.5 mmol/L (ref 3.5–5.1)
Sodium: 139 mmol/L (ref 135–145)
Total Bilirubin: 0.7 mg/dL (ref 0.0–1.2)
Total Protein: 7.1 g/dL (ref 6.5–8.1)

## 2023-11-21 LAB — LACTATE DEHYDROGENASE: LDH: 171 U/L (ref 98–192)

## 2023-11-21 NOTE — Progress Notes (Signed)
 [facility] [phone]   [phone]  OFFICE PROGRESS NOTE  Stephane Leita DEL, MD [address] [place] [address]  DIAGNOSIS:  1) Stage I (T1a, N0, M0) clear-cell renal cell carcinoma diagnosed in 2022.   2) bilateral pulmonary nodule consistent with carcinoid tumor diagnosed in December 2024.  PRIOR THERAPY: Status post robotic assisted laparoscopic right radical nephrectomy on June 26, 2020 and the final pathology showed clear-cell renal cell carcinoma nuclear grade 2 measuring 4.0 cm.  CURRENT THERAPY: Observation  INTERVAL HISTORY: Christopher Mosley 78 y.o. male returns to the clinic today for follow-up visit.  Discussed the use of AI scribe software for clinical note transcription with the patient, who gave verbal consent to proceed.  History of Present Illness Christopher Mosley is a 78 year old male with bilateral pulmonary nodules consistent with carcinoid tumor who presents for evaluation and discussion of his recent PET scan results.  He was diagnosed with stage one clear cell renal cell carcinoma in 2022 and underwent a laparoscopic right radical nephrectomy in February 2022. He is currently under observation for bilateral pulmonary nodules consistent with a carcinoid tumor, diagnosed in December 2024.  He recently underwent a PET DOTATATE scan to evaluate the activity of the carcinoid tumor. He reports no chest pain, shortness of breath, cough, hemoptysis, hot flashes, or diarrhea since his last visit in December. He mentions intentional weight loss and states that his blood pressure is typically elevated during doctor visits but was 138/84 mmHg at home this morning.  He had his annual cardiology checkup at [facility], which he reports was good. He is currently on blood pressure medication.     MEDICAL HISTORY: Past Medical History:  Diagnosis Date   Allergy    Arthritis    Chronic kidney disease 03/2020   Colon  polyps 09/08/2017   q 5 years; need records   Complication of anesthesia    Heart murmur    History of hepatitis B    Hyperlipemia    Hypertension    Hypothyroidism    Lesion of vocal cord 02/06/2017   Right granuloma; resolved on f/u   Melanoma (HCC)    Papillary thyroid  carcinoma (HCC)    s/p thyroidectomy,kidney,melanomas(2)   PONV (postoperative nausea and vomiting)    Urine incontinence     ALLERGIES:  is allergic to codeine and sulfa antibiotics.  MEDICATIONS:  Current Outpatient Medications  Medication Sig Dispense Refill   amLODipine  (NORVASC ) 5 MG tablet Take 5 mg by mouth daily.     aspirin (ASPIRIN 81) 81 MG chewable tablet      hydrALAZINE (APRESOLINE) 50 MG tablet Take 50 mg by mouth 2 (two) times daily.     levothyroxine  (SYNTHROID ) 75 MCG tablet TAKE 1 TABLET BY MOUTH ONCE DAILY BEFORE BREAKFAST (Patient taking differently: Take 75 mcg by mouth daily before breakfast.) 90 tablet 3   triamterene-hydrochlorothiazide (DYAZIDE) 37.5-25 MG capsule Take 0.5 capsules by mouth daily.     No current facility-administered medications for this visit.    SURGICAL HISTORY:  Past Surgical History:  Procedure Laterality Date   BLADDER SURGERY  2016   BLALOCK PROCEDURE  1954   BRONCHIAL BIOPSY  04/17/2023   Procedure: BRONCHIAL BIOPSIES;  Surgeon: Shelah Lamar RAMAN, MD;  Location: [facility] ENDOSCOPY;  Service: Pulmonary;;   BRONCHIAL BRUSHINGS  04/17/2023   Procedure: BRONCHIAL BRUSHINGS;  Surgeon: Shelah Lamar RAMAN, MD;  Location: [facility] ENDOSCOPY;  Service: Pulmonary;;   BRONCHIAL  NEEDLE ASPIRATION BIOPSY  04/17/2023   Procedure: BRONCHIAL NEEDLE ASPIRATION BIOPSIES;  Surgeon: Shelah Lamar RAMAN, MD;  Location: [facility] ENDOSCOPY;  Service: Pulmonary;;   CARDIAC VALVE REPLACEMENT  10/2016   COLONOSCOPY     multiple - hx adenomas 2010 and 2015   CYSTOSCOPY N/A 06/26/2020   Procedure: FLEXIBLE CYSTOSCOPY FOR BLADDER STONE AND PLACEMENT OF FOLEY;  Surgeon: Selma Donnice SAUNDERS, MD;  Location: [facility] ORS;   Service: Urology;  Laterality: N/A;   JOINT REPLACEMENT  01/2015   Total left knee   MELANOMA EXCISION Left 1990   knee   MELANOMA EXCISION Right 1994   chest   Pottts shunt  1957   PULMONARY VALVE REPLACEMENT  10/21/2016   pig valve   ROBOTIC ASSITED PARTIAL NEPHRECTOMY Right 06/26/2020   Procedure: XI ROBOTIC ASSITED LAPAROSCOPIC RADICAL NEPHRECTOMY;  Surgeon: Selma Donnice SAUNDERS, MD;  Location: [facility] ORS;  Service: Urology;  Laterality: Right;   TETRALOGY OF FALLOT REPAIR  1961   TOTAL KNEE ARTHROPLASTY Left 2016   TOTAL THYROIDECTOMY  2002   VIDEO BRONCHOSCOPY WITH ENDOBRONCHIAL ULTRASOUND Bilateral 04/17/2023   Procedure: VIDEO BRONCHOSCOPY WITH ENDOBRONCHIAL ULTRASOUND;  Surgeon: Shelah Lamar RAMAN, MD;  Location: [facility] ENDOSCOPY;  Service: Pulmonary;  Laterality: Bilateral;    REVIEW OF SYSTEMS:  Constitutional: negative Eyes: negative Ears, nose, mouth, throat, and face: negative Respiratory: negative Cardiovascular: negative Gastrointestinal: negative Genitourinary:negative Integument/breast: negative Hematologic/lymphatic: negative Musculoskeletal:negative Neurological: negative Behavioral/Psych: negative Endocrine: negative Allergic/Immunologic: negative   PHYSICAL EXAMINATION: General appearance: alert, cooperative, and no distress Head: Normocephalic, without obvious abnormality, atraumatic Neck: no adenopathy, no JVD, supple, symmetrical, trachea midline, and thyroid  not enlarged, symmetric, no tenderness/mass/nodules Lymph nodes: Cervical, supraclavicular, and axillary nodes normal. Resp: clear to auscultation bilaterally Back: symmetric, no curvature. ROM normal. No CVA tenderness. Cardio: regular rate and rhythm, S1, S2 normal, no murmur, click, rub or gallop GI: soft, non-tender; bowel sounds normal; no masses,  no organomegaly Extremities: extremities normal, atraumatic, no cyanosis or edema Neurologic: Alert and oriented X 3, normal strength and tone. Normal symmetric  reflexes. Normal coordination and gait  ECOG PERFORMANCE STATUS: 1 - Symptomatic but completely ambulatory  Blood pressure (!) 168/79, pulse 67, temperature (!) 97.3 F (36.3 C), temperature source Temporal, resp. rate 17, height 5' 4.5 (1.638 m), weight 153 lb (69.4 kg), SpO2 100%.  LABORATORY DATA: Lab Results  Component Value Date   WBC 7.3 11/21/2023   HGB 15.1 11/21/2023   HCT 46.0 11/21/2023   MCV 86.3 11/21/2023   PLT 250 11/21/2023      Chemistry      Component Value Date/Time   NA 139 11/21/2023 0857   K 4.5 11/21/2023 0857   CL 100 11/21/2023 0857   CO2 32 11/21/2023 0857   BUN 34 (H) 11/21/2023 0857   CREATININE 1.74 (H) 11/21/2023 0857      Component Value Date/Time   CALCIUM  9.8 11/21/2023 0857   ALKPHOS 54 11/21/2023 0857   AST 20 11/21/2023 0857   ALT 14 11/21/2023 0857   BILITOT 0.7 11/21/2023 0857       RADIOGRAPHIC STUDIES: NM PET DOTATATE SKULL BASE TO MID THIGH Result Date: 11/13/2023 CLINICAL DATA:  Subsequent treatment strategy for carcinoma of the lung. Additional history of renal cell carcinoma EXAM: NUCLEAR MEDICINE PET SKULL BASE TO THIGH TECHNIQUE: 4.3 mCi copper  60 DOTATATE was injected intravenously. Full-ring PET imaging was performed from the skull base to thigh after the radiotracer. CT data was obtained and used for attenuation correction and anatomic  localization. COMPARISON:  CT 03/30/2023, 10/02/2020 FINDINGS: NECK No radiotracer activity in neck lymph nodes. Incidental CT findings: None CHEST Markedly enlarged pulmonary arteries extend into the hila. Nodule in the RIGHT upper lobe measures 9 mm not changed from 8 mm on CT 10/02/2020. This nodule has very mild radiotracer activity (SUV max less than 2) Small nodule in the superior segment LEFT lower lobe RIGHT lower lobe measures 5 mm (58/4) not changed from prior. Other nodules in the RIGHT lung are unchanged from prior and do not have radiotracer activity Incidental CT finding:None  ABDOMEN/PELVIS No abnormal radiotracer activity in the abdomen pelvis suggest well differentiated neuroendocrine tumor. Physiologic activity noted in the liver, spleen, adrenal glands and kidneys. Uptake in the prostate gland is nonspecific. Incidental CT findings: Multiple diverticula of the descending colon and sigmoid colon without acute inflammation. SKELETON No focal activity to suggest skeletal metastasis. Incidental CT findings:None IMPRESSION: 1. Stable bilateral round pulmonary nodules compared to CT 10/02/2020. Nodules have very low or no radiotracer activity which would not be typical well differentiated neuroendocrine tumor. Of note bronchial neuroendocrine tumors have less DOTATATE avidity than GI tumors. 2. No evidence of Well differentiated neuroendocrine tumor in the abdomen pelvis. 3. Marked enlargement of the pulmonary arteries again noted. Electronically Signed   By: Jackquline Boxer M.D.   On: 11/13/2023 14:26    ASSESSMENT AND PLAN: This is a very pleasant 78  years old white male with Stage IA (T1a, N0, M0) clear-cell renal cell carcinoma diagnosed in 2022. He is status post robotic assisted laparoscopic right radical nephrectomy on June 26, 2020 and the final pathology showed clear-cell renal cell carcinoma nuclear grade 2 measuring 4.0 cm. The patient was also recently diagnosed with carcinoid tumor of the lung presented with bilateral pulmonary nodules in December 2024. The patient had a PET dotatate scan performed recently and he is here for evaluation and discussion of his imaging studies. His scan showed no concerning findings for disease progression. Assessment and Plan Assessment & Plan Bilateral pulmonary nodules consistent with carcinoid tumor Recent PET DoDoTate scan shows no concerning activity, indicating stable disease. - Schedule annual CT scan of the chest for surveillance.  Stage 1 clear cell renal cell carcinoma Status post laparoscopic right radical  nephrectomy in February 2022. Currently no evidence of recurrence or metastasis.  Hypertension Currently managed with medication. Blood pressure recorded at home as 138/84 mmHg, slightly elevated in the clinic setting. He was advised to call immediately if he has any concerning symptoms in the interval.  The patient voices understanding of current disease status and treatment options and is in agreement with the current care plan.  All questions were answered. The patient knows to call the clinic with any problems, questions or concerns. We can certainly see the patient much sooner if necessary.  The total time spent in the appointment was 30 minutes.  Disclaimer: This note was dictated with voice recognition software. Similar sounding words can inadvertently be transcribed and may not be corrected upon review.

## 2024-11-12 ENCOUNTER — Inpatient Hospital Stay

## 2024-11-19 ENCOUNTER — Inpatient Hospital Stay: Admitting: Internal Medicine

## 2024-11-21 ENCOUNTER — Ambulatory Visit: Admitting: Internal Medicine

## 2024-11-21 ENCOUNTER — Other Ambulatory Visit
